# Patient Record
Sex: Female | Born: 1945 | Race: White | Hispanic: No | Smoking: Never smoker
Health system: Southern US, Community
[De-identification: ages and names within clinical notes are randomized; demographics above are authoritative.]

## PROBLEM LIST (undated history)

## (undated) DIAGNOSIS — E785 Hyperlipidemia, unspecified: Secondary | ICD-10-CM

## (undated) DIAGNOSIS — G8929 Other chronic pain: Secondary | ICD-10-CM

## (undated) DIAGNOSIS — L219 Seborrheic dermatitis, unspecified: Secondary | ICD-10-CM

## (undated) DIAGNOSIS — J181 Lobar pneumonia, unspecified organism: Secondary | ICD-10-CM

## (undated) DIAGNOSIS — Z8719 Personal history of other diseases of the digestive system: Secondary | ICD-10-CM

## (undated) DIAGNOSIS — T8859XA Other complications of anesthesia, initial encounter: Secondary | ICD-10-CM

## (undated) DIAGNOSIS — R7301 Impaired fasting glucose: Secondary | ICD-10-CM

## (undated) DIAGNOSIS — I10 Essential (primary) hypertension: Secondary | ICD-10-CM

## (undated) DIAGNOSIS — M1990 Unspecified osteoarthritis, unspecified site: Secondary | ICD-10-CM

## (undated) DIAGNOSIS — K449 Diaphragmatic hernia without obstruction or gangrene: Secondary | ICD-10-CM

## (undated) DIAGNOSIS — K222 Esophageal obstruction: Secondary | ICD-10-CM

## (undated) DIAGNOSIS — Z950 Presence of cardiac pacemaker: Secondary | ICD-10-CM

## (undated) DIAGNOSIS — I498 Other specified cardiac arrhythmias: Secondary | ICD-10-CM

## (undated) DIAGNOSIS — K219 Gastro-esophageal reflux disease without esophagitis: Secondary | ICD-10-CM

## (undated) DIAGNOSIS — I499 Cardiac arrhythmia, unspecified: Secondary | ICD-10-CM

## (undated) DIAGNOSIS — M545 Low back pain, unspecified: Secondary | ICD-10-CM

## (undated) DIAGNOSIS — N183 Chronic kidney disease, stage 3 (moderate): Secondary | ICD-10-CM

## (undated) DIAGNOSIS — T4145XA Adverse effect of unspecified anesthetic, initial encounter: Secondary | ICD-10-CM

## (undated) DIAGNOSIS — Z87442 Personal history of urinary calculi: Secondary | ICD-10-CM

## (undated) DIAGNOSIS — M5030 Other cervical disc degeneration, unspecified cervical region: Secondary | ICD-10-CM

## (undated) HISTORY — PX: CARPAL TUNNEL RELEASE: SHX101

## (undated) HISTORY — PX: GUM SURGERY: SHX658

## (undated) HISTORY — PX: ABDOMINAL HYSTERECTOMY: SHX81

## (undated) HISTORY — PX: TARSAL TUNNEL RELEASE: SUR1099

## (undated) HISTORY — PX: BACK SURGERY: SHX140

## (undated) HISTORY — PX: CYSTOSCOPY W/ STONE MANIPULATION: SHX1427

## (undated) HISTORY — PX: ESOPHAGOGASTRODUODENOSCOPY (EGD) WITH ESOPHAGEAL DILATION: SHX5812

## (undated) HISTORY — PX: LUMBAR DISC SURGERY: SHX700

## (undated) HISTORY — DX: Other specified cardiac arrhythmias: I49.8

## (undated) HISTORY — PX: TONSILLECTOMY: SUR1361

## (undated) HISTORY — PX: BLADDER SUSPENSION: SHX72

## (undated) HISTORY — PX: JOINT REPLACEMENT: SHX530

## (undated) HISTORY — PX: DILATION AND CURETTAGE OF UTERUS: SHX78

## (undated) HISTORY — PX: TRANSTHORACIC ECHOCARDIOGRAM: SHX275

---

## 2004-06-30 ENCOUNTER — Ambulatory Visit: Admission: RE | Admit: 2004-06-30 | Discharge: 2004-06-30 | Payer: Self-pay | Admitting: Specialist

## 2004-08-02 HISTORY — PX: TOTAL KNEE ARTHROPLASTY: SHX125

## 2004-08-26 ENCOUNTER — Inpatient Hospital Stay (HOSPITAL_COMMUNITY): Admission: RE | Admit: 2004-08-26 | Discharge: 2004-08-29 | Payer: Self-pay | Admitting: Specialist

## 2004-09-01 ENCOUNTER — Ambulatory Visit (HOSPITAL_COMMUNITY): Admission: RE | Admit: 2004-09-01 | Discharge: 2004-09-01 | Payer: Self-pay | Admitting: Specialist

## 2004-10-30 ENCOUNTER — Ambulatory Visit (HOSPITAL_COMMUNITY): Admission: RE | Admit: 2004-10-30 | Discharge: 2004-10-30 | Payer: Self-pay | Admitting: Specialist

## 2005-03-30 ENCOUNTER — Encounter: Admission: RE | Admit: 2005-03-30 | Discharge: 2005-03-30 | Payer: Self-pay | Admitting: Orthopedic Surgery

## 2005-08-02 HISTORY — PX: TOTAL KNEE REVISION: SHX996

## 2005-10-27 ENCOUNTER — Inpatient Hospital Stay (HOSPITAL_COMMUNITY): Admission: RE | Admit: 2005-10-27 | Discharge: 2005-10-30 | Payer: Self-pay | Admitting: Orthopedic Surgery

## 2005-12-14 ENCOUNTER — Ambulatory Visit (HOSPITAL_BASED_OUTPATIENT_CLINIC_OR_DEPARTMENT_OTHER): Admission: RE | Admit: 2005-12-14 | Discharge: 2005-12-14 | Payer: Self-pay | Admitting: Orthopedic Surgery

## 2005-12-16 ENCOUNTER — Ambulatory Visit: Payer: Self-pay | Admitting: Internal Medicine

## 2005-12-16 ENCOUNTER — Inpatient Hospital Stay (HOSPITAL_COMMUNITY): Admission: AD | Admit: 2005-12-16 | Discharge: 2005-12-22 | Payer: Self-pay | Admitting: Orthopedic Surgery

## 2006-01-06 ENCOUNTER — Ambulatory Visit: Payer: Self-pay | Admitting: Internal Medicine

## 2006-03-21 ENCOUNTER — Inpatient Hospital Stay (HOSPITAL_COMMUNITY): Admission: RE | Admit: 2006-03-21 | Discharge: 2006-03-25 | Payer: Self-pay | Admitting: Orthopedic Surgery

## 2006-03-22 ENCOUNTER — Ambulatory Visit: Payer: Self-pay | Admitting: Infectious Diseases

## 2006-07-18 ENCOUNTER — Inpatient Hospital Stay (HOSPITAL_COMMUNITY): Admission: RE | Admit: 2006-07-18 | Discharge: 2006-07-22 | Payer: Self-pay | Admitting: Orthopedic Surgery

## 2007-04-27 ENCOUNTER — Ambulatory Visit: Payer: Self-pay | Admitting: Family Medicine

## 2007-04-27 DIAGNOSIS — I1310 Hypertensive heart and chronic kidney disease without heart failure, with stage 1 through stage 4 chronic kidney disease, or unspecified chronic kidney disease: Secondary | ICD-10-CM | POA: Insufficient documentation

## 2007-04-27 DIAGNOSIS — I129 Hypertensive chronic kidney disease with stage 1 through stage 4 chronic kidney disease, or unspecified chronic kidney disease: Secondary | ICD-10-CM | POA: Insufficient documentation

## 2007-04-27 DIAGNOSIS — I10 Essential (primary) hypertension: Secondary | ICD-10-CM

## 2007-04-27 HISTORY — DX: Essential (primary) hypertension: I10

## 2007-04-28 ENCOUNTER — Encounter: Payer: Self-pay | Admitting: Family Medicine

## 2007-04-28 LAB — CONVERTED CEMR LAB
ALT: 16 units/L (ref 0–35)
AST: 14 units/L (ref 0–37)
Albumin: 4.5 g/dL (ref 3.5–5.2)
Alkaline Phosphatase: 32 units/L — ABNORMAL LOW (ref 39–117)
BUN: 22 mg/dL (ref 6–23)
CO2: 23 meq/L (ref 19–32)
Calcium: 10 mg/dL (ref 8.4–10.5)
Chloride: 102 meq/L (ref 96–112)
Cholesterol, target level: 200 mg/dL
Cholesterol: 220 mg/dL — ABNORMAL HIGH (ref 0–200)
Creatinine, Ser: 0.98 mg/dL (ref 0.40–1.20)
Glucose, Bld: 97 mg/dL (ref 70–99)
HDL goal, serum: 40 mg/dL
HDL: 49 mg/dL (ref >39)
LDL Cholesterol: 141 mg/dL — ABNORMAL HIGH (ref 0–99)
LDL Goal: 130 mg/dL
Potassium: 4.6 meq/L (ref 3.5–5.3)
Sodium: 138 meq/L (ref 135–145)
Total Bilirubin: 0.7 mg/dL (ref 0.3–1.2)
Total CHOL/HDL Ratio: 4.5
Total Protein: 7.3 g/dL (ref 6.0–8.3)
Triglycerides: 150 mg/dL — ABNORMAL HIGH (ref <150)
VLDL: 30 mg/dL (ref 0–40)

## 2007-08-09 ENCOUNTER — Encounter: Admission: RE | Admit: 2007-08-09 | Discharge: 2007-08-09 | Payer: Self-pay | Admitting: Orthopedic Surgery

## 2007-08-29 ENCOUNTER — Ambulatory Visit: Payer: Self-pay | Admitting: Family Medicine

## 2007-09-02 ENCOUNTER — Encounter: Admission: RE | Admit: 2007-09-02 | Discharge: 2007-09-02 | Payer: Self-pay | Admitting: Unknown Physician Specialty

## 2007-10-04 ENCOUNTER — Ambulatory Visit: Payer: Self-pay | Admitting: Family Medicine

## 2007-10-04 DIAGNOSIS — K449 Diaphragmatic hernia without obstruction or gangrene: Secondary | ICD-10-CM | POA: Insufficient documentation

## 2007-10-04 DIAGNOSIS — K219 Gastro-esophageal reflux disease without esophagitis: Secondary | ICD-10-CM

## 2007-10-04 HISTORY — DX: Gastro-esophageal reflux disease without esophagitis: K21.9

## 2007-10-04 HISTORY — DX: Diaphragmatic hernia without obstruction or gangrene: K44.9

## 2007-10-21 ENCOUNTER — Encounter: Admission: RE | Admit: 2007-10-21 | Discharge: 2007-10-21 | Payer: Self-pay | Admitting: Unknown Physician Specialty

## 2007-12-01 ENCOUNTER — Ambulatory Visit: Payer: Self-pay | Admitting: Family Medicine

## 2008-05-03 ENCOUNTER — Ambulatory Visit: Payer: Self-pay | Admitting: Family Medicine

## 2008-06-07 ENCOUNTER — Telehealth: Payer: Self-pay | Admitting: Family Medicine

## 2008-09-20 ENCOUNTER — Telehealth: Payer: Self-pay | Admitting: Family Medicine

## 2008-09-20 DIAGNOSIS — M25539 Pain in unspecified wrist: Secondary | ICD-10-CM

## 2008-09-24 ENCOUNTER — Encounter: Payer: Self-pay | Admitting: Family Medicine

## 2008-09-27 LAB — CONVERTED CEMR LAB: Zinc: 78 (ref 60–130)

## 2008-10-04 ENCOUNTER — Telehealth: Payer: Self-pay | Admitting: Family Medicine

## 2008-12-31 ENCOUNTER — Encounter: Admission: RE | Admit: 2008-12-31 | Discharge: 2008-12-31 | Payer: Self-pay | Admitting: Orthopedic Surgery

## 2009-01-07 ENCOUNTER — Telehealth: Payer: Self-pay | Admitting: Family Medicine

## 2010-07-03 ENCOUNTER — Ambulatory Visit: Payer: Self-pay | Admitting: Family Medicine

## 2010-07-06 ENCOUNTER — Telehealth (INDEPENDENT_AMBULATORY_CARE_PROVIDER_SITE_OTHER): Payer: Self-pay | Admitting: *Deleted

## 2010-07-06 LAB — CONVERTED CEMR LAB
ALT: 25 units/L (ref 0–35)
AST: 19 units/L (ref 0–37)
Albumin: 4.5 g/dL (ref 3.5–5.2)
Alkaline Phosphatase: 25 units/L — ABNORMAL LOW (ref 39–117)
BUN: 26 mg/dL — ABNORMAL HIGH (ref 6–23)
CO2: 24 meq/L (ref 19–32)
Calcium: 10 mg/dL (ref 8.4–10.5)
Chloride: 104 meq/L (ref 96–112)
Cholesterol: 227 mg/dL — ABNORMAL HIGH (ref 0–200)
Creatinine, Ser: 1.35 mg/dL — ABNORMAL HIGH (ref 0.40–1.20)
Glucose, Bld: 95 mg/dL (ref 70–99)
HDL: 45 mg/dL (ref >39)
LDL Cholesterol: 143 mg/dL — ABNORMAL HIGH (ref 0–99)
Potassium: 5.2 meq/L (ref 3.5–5.3)
Sodium: 140 meq/L (ref 135–145)
Total Bilirubin: 0.4 mg/dL (ref 0.3–1.2)
Total CHOL/HDL Ratio: 5
Total Protein: 7 g/dL (ref 6.0–8.3)
Triglycerides: 195 mg/dL — ABNORMAL HIGH (ref <150)
VLDL: 39 mg/dL (ref 0–40)

## 2010-08-10 ENCOUNTER — Ambulatory Visit
Admission: RE | Admit: 2010-08-10 | Discharge: 2010-08-10 | Payer: Self-pay | Source: Home / Self Care | Attending: Family Medicine | Admitting: Family Medicine

## 2010-08-10 DIAGNOSIS — N183 Chronic kidney disease, stage 3 unspecified: Secondary | ICD-10-CM | POA: Insufficient documentation

## 2010-08-10 DIAGNOSIS — R7301 Impaired fasting glucose: Secondary | ICD-10-CM

## 2010-08-10 DIAGNOSIS — E118 Type 2 diabetes mellitus with unspecified complications: Secondary | ICD-10-CM | POA: Insufficient documentation

## 2010-08-10 DIAGNOSIS — E785 Hyperlipidemia, unspecified: Secondary | ICD-10-CM | POA: Insufficient documentation

## 2010-08-10 HISTORY — DX: Impaired fasting glucose: R73.01

## 2010-08-10 HISTORY — DX: Hyperlipidemia, unspecified: E78.5

## 2010-08-20 ENCOUNTER — Encounter: Payer: Self-pay | Admitting: Family Medicine

## 2010-08-20 LAB — CONVERTED CEMR LAB
BUN: 37 mg/dL — ABNORMAL HIGH (ref 6–23)
CO2: 24 meq/L (ref 19–32)
Creatinine, Ser: 1.6 mg/dL — ABNORMAL HIGH (ref 0.40–1.20)
Glucose, Bld: 134 mg/dL — ABNORMAL HIGH (ref 70–99)
Potassium: 4.7 meq/L (ref 3.5–5.3)
Sodium: 138 meq/L (ref 135–145)

## 2010-08-21 ENCOUNTER — Ambulatory Visit: Admit: 2010-08-21 | Payer: Self-pay | Admitting: Family Medicine

## 2010-08-23 ENCOUNTER — Encounter: Payer: Self-pay | Admitting: Orthopedic Surgery

## 2010-08-24 ENCOUNTER — Ambulatory Visit
Admission: RE | Admit: 2010-08-24 | Discharge: 2010-08-24 | Payer: Self-pay | Source: Home / Self Care | Attending: Family Medicine | Admitting: Family Medicine

## 2010-09-01 NOTE — Assessment & Plan Note (Signed)
Summary: hypertension follow-up.  Patient has not been seen since 2009   Vital Signs:  Patient profile:   65 year old female Height:      62 inches Weight:      192 pounds BMI:     35.24 Pulse rate:   102 / minute BP sitting:   162 / 88  (right arm) Cuff size:   large  Vitals Entered By: Avon Gully CMA, Duncan Dull) (July 03, 2010 9:58 AM)  Serial Vital Signs/Assessments:  Time      Position  BP       Pulse  Resp  Temp     By 10:26 AM            152/86                         Avon Gully CMA, (AAMA)  CC: BLd work, f/u BP, Hypertension Management   Primary Care Porsche Noguchi:  Nani Gasser MD  CC:  BLd work, f/u BP, and Hypertension Management.  History of Present Illness: [name] has not been here since 2009.  She came in today because we had not refilled her blood pressure medications.  She denies any chest pain shortness of breath or symptoms.  She says she has tolerated her blood pressure medications well.  She still continues to have knee problems and follows with a repeat orthopedist regularly. She does not check her blood pressure she denies any headaches or feeling poorly.  Hypertension History:      She denies headache, chest pain, palpitations, dyspnea with exertion, orthopnea, PND, peripheral edema, visual symptoms, neurologic problems, syncope, and side effects from treatment.  She notes no problems with any antihypertensive medication side effects.  Has noticed shesweats alot. Marland Kitchen        Positive major cardiovascular risk factors include female age 56 years old or older and hypertension.  Negative major cardiovascular risk factors include no history of diabetes or hyperlipidemia, negative family history for ischemic heart disease, and non-tobacco-user status.        Further assessment for target organ damage reveals no history of ASHD, cardiac end-organ damage (CHF/LVH), stroke/TIA, peripheral vascular disease, renal insufficiency, or hypertensive retinopathy.      Current Medications (verified): 1)  Lisinopril-Hydrochlorothiazide 20-25 Mg  Tabs (Lisinopril-Hydrochlorothiazide) .... Take 1 Tablet By Mouth Once A Day 2)  Hydrocodone-Acetaminophen 10-750 Mg  Tabs (Hydrocodone-Acetaminophen) .... Take One Tablet By Mouth Twice A Day As Needed 3)  Methocarbamol 750 Mg  Tabs (Methocarbamol) .... Take One Tablet By Mouth Three Times A Day 4)  Arthrotec 75 75-200 Mg-Mcg  Tabs (Diclofenac-Misoprostol) .... Take 1 Tablet By Mouth Two Times A Day  Allergies (verified): No Known Drug Allergies  Comments:  Nurse/Medical Assistant: The patient's medications and allergies were reviewed with the patient and were updated in the Medication and Allergy Lists. Avon Gully CMA, Duncan Dull) (July 03, 2010 9:58 AM)  Past History:  Past Surgical History: Last updated: 12/01/2007 Complete Left Knee replacement in 2006.  Got a staph infection and had to have IV ABX and spacer place. Then  had second replacement with new hardware in 2007- On chronic Keflex. Hysterectomy 2 Back Surgeries  Social History: Last updated: 07/03/2010 Retired.  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  Never Smoked Alcohol use-no Drug use-no No regular exercise.   Past Medical History: Ortho- Dr [name] prescribes her chronic pain meds.  Dr. Chriss Czar in [facility] - Neurologist.  Family History: Reviewed history from 04/27/2007 and no changes required. Mother with MI, DM, HTN Father with HTN  Social History: Retired.  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  Never Smoked Alcohol use-no Drug use-no No regular exercise.   Physical Exam  General:  Well-developed,well-nourished,in no acute distress; alert,appropriate and cooperative throughout examination. she is obese. Head:  Normocephalic and atraumatic without obvious abnormalities. No apparent alopecia or balding. Lungs:  Normal respiratory effort, chest expands symmetrically. Lungs are clear to  auscultation, no crackles or wheezes. Heart:  Normal rate and regular rhythm. S1 and S2 normal without gallop, murmur, click, rub or other extra sounds. no carotid or abdominal bruits Pulses:  radial pulses 2+ bilaterally Extremities:  no lower extremity edema Skin:  no rashes.   Cervical Nodes:  No lymphadenopathy noted Psych:  Cognition and judgment appear intact. Alert and cooperative with normal attention span and concentration. No apparent delusions, illusions, hallucinations   Impression & Recommendations:  Problem # 1:  HYPERTENSION, BENIGN ESSENTIAL (ICD-401.1) Assessment Deteriorated Has gianed 10 lbs. this is clearly contribute to her poor blood pressure control.replete blood pressure was still high as well.  All beta-blocker to her current regimen and have her follow-up in one month.  That will be honest on the sure she will follow-up.  She is not really believe and preventative care.  She is also due for lab work as it has been is two years.  I gave her the lab slip today. Her updated medication list for this problem includes:    Lisinopril-hydrochlorothiazide 20-25 Mg Tabs (Lisinopril-hydrochlorothiazide) .Marland Kitchen... Take 1 tablet by mouth once a day    Metoprolol Succinate 50 Mg Xr24h-tab (Metoprolol succinate) .Marland Kitchen... Take 1 tablet by mouth once a day  Orders: T-Comprehensive Metabolic Panel [id number]) T-Lipid Profile ([id number])  Complete Medication List: 1)  Lisinopril-hydrochlorothiazide 20-25 Mg Tabs (Lisinopril-hydrochlorothiazide) .... Take 1 tablet by mouth once a day 2)  Hydrocodone-acetaminophen 10-750 Mg Tabs (Hydrocodone-acetaminophen) .... Take one tablet by mouth twice a day as needed 3)  Methocarbamol 750 Mg Tabs (Methocarbamol) .... Take one tablet by mouth three times a day 4)  Arthrotec 75 75-200 Mg-mcg Tabs (Diclofenac-misoprostol) .... Take 1 tablet by mouth two times a day 5)  Metoprolol Succinate 50 Mg Xr24h-tab (Metoprolol succinate) .... Take 1 tablet by  mouth once a day  Hypertension Assessment/Plan:      The patient's hypertensive risk group is category B: At least one risk factor (excluding diabetes) with no target organ damage.  Her calculated 10 year risk of coronary heart disease is 17 %.  Today's blood pressure is 162/88.    Contraindications/Deferment of Procedures/Staging:    Test/Procedure: Colonoscopy    Reason for deferment: patient declined     Test/Procedure: PAP Smear    Reason for deferment: patient declined     Test/Procedure: Mammogram    Reason for deferment: patient declined   Patient Instructions: 1)  Follow up in one month to recheck your blood pressure.  Prescriptions: LISINOPRIL-HYDROCHLOROTHIAZIDE 20-25 MG  TABS (LISINOPRIL-HYDROCHLOROTHIAZIDE) Take 1 tablet by mouth once a day  #90 Tablet x 1   Entered and Authorized by:   Nani Gasser MD   Signed by:   Nani Gasser MD on 07/03/2010   Method used:   Electronically to        [facility] # [phone]* (retail)       [address]       [facility],  [place]  [address]       Ph: [phone]       Fax: [phone]   RxID:   [id number] METOPROLOL SUCCINATE 50 MG XR24H-TAB (METOPROLOL SUCCINATE) Take 1 tablet by mouth once a day  #30 x 1   Entered and Authorized by:   Nani Gasser MD   Signed by:   Nani Gasser MD on 07/03/2010   Method used:   Electronically to        [facility] # [phone]* (retail)       [address] [place], [place]  [address]       Ph: [phone]       Fax: [phone]   RxID:   [id number]    Orders Added: 1)  T-Comprehensive Metabolic Panel [[id number]] 2)  T-Lipid Profile [[id number]] 3)  Est. Patient Level IV [60109]   Immunization History:  Influenza Immunization History:    Influenza:  historical (05/04/2010)  Tetanus/Td Immunization History:    Tetanus/Td:  historical (08/03/2003)  Pneumovax Immunization History:     Pneumovax:  historical (08/02/2006)   Immunization History:  Influenza Immunization History:    Influenza:  Historical (05/04/2010)  Tetanus/Td Immunization History:    Tetanus/Td:  Historical (08/03/2003)  Pneumovax Immunization History:    Pneumovax:  Historical (08/02/2006)   Immunization History:  Influenza Immunization History:    Influenza:  historical (05/04/2010)  Tetanus/Td Immunization History:    Tetanus/Td:  historical (08/03/2003)  Pneumovax Immunization History:    Pneumovax:  historical (08/02/2006)   TD Next Due:  10 yr

## 2010-09-01 NOTE — Progress Notes (Signed)
----   Converted from flag ---- ---- 07/03/2010 12:45 PM, Nani Gasser MD wrote: we let patient know that I sent her for a second blood pressure medication.  Thus she will be taking two medications.  She can take both at the same time one time a day.  Follow-up in one month ------------------------------ 07/06/10 8:49 acm  called and notified pt of above

## 2010-09-03 NOTE — Assessment & Plan Note (Signed)
Summary: F/UPP HTN   Vital Signs:  Patient profile:   65 year old female Height:      62 inches Weight:      193 pounds Pulse rate:   97 / minute BP sitting:   128 / 78  (right arm) Cuff size:   regular  Vitals Entered By: Avon Gully CMA, Duncan Dull) (August 10, 2010 12:59 PM) CC: f/u BP   CC:  f/u BP.  Current Medications (verified): 1)  Lisinopril-Hydrochlorothiazide 20-12.5 Mg Tabs (Lisinopril-Hydrochlorothiazide) .... Take 1 Tablet By Mouth Once A Day 2)  Hydrocodone-Acetaminophen 10-750 Mg  Tabs (Hydrocodone-Acetaminophen) .... Take One Tablet By Mouth Twice A Day As Needed 3)  Methocarbamol 750 Mg  Tabs (Methocarbamol) .... Take One Tablet By Mouth Three Times A Day 4)  Arthrotec 75 75-200 Mg-Mcg  Tabs (Diclofenac-Misoprostol) .... Take 1 Tablet By Mouth Two Times A Day 5)  Metoprolol Succinate 50 Mg Xr24h-Tab (Metoprolol Succinate) .... Take 1 Tablet By Mouth Once A Day  Allergies (verified): No Known Drug Allergies  Comments:  Nurse/Medical Assistant: The patient's medications and allergies were reviewed with the patient and were updated in the Medication and Allergy Lists. Avon Gully CMA, Duncan Dull) (August 10, 2010 1:00 PM)   Impression & Recommendations:  Problem # 1:  HYPERTENSION, BENIGN ESSENTIAL (ICD-401.1)  Dong well on the  medication. BP looks fantastic. She still needs to f/u on her kidney function as she may have some chronic renals insufficiency or she may just have  been a little dedhydrated. If cr/bun still high will need to get rid of the HCTZ completely.  F/U in 6 mo.  Her updated medication list for this problem includes:    Lisinopril-hydrochlorothiazide 20-12.5 Mg Tabs (Lisinopril-hydrochlorothiazide) .Marland Kitchen... Take 1 tablet by mouth once a day    Metoprolol Succinate 50 Mg Xr24h-tab (Metoprolol succinate) .Marland Kitchen... Take 1 tablet by mouth once a day  Orders: T-Basic Metabolic Panel [id number])  Problem # 2:  BLOOD CHEMISTRY, ABNORMAL  (ICD-790.99) She still needs to f/u on her kidney function as she may have some chronic renals insufficiency or she may just have  been a little dedhydrated. If cr/bun still high will need to get rid of the HCTZ completely.  Orders: T-Basic Metabolic Panel [phone])  Problem # 3:  HYPERLIPIDEMIA (ICD-272.4) She adamantly doesn't want to take a statin.   Complete Medication List: 1)  Lisinopril-hydrochlorothiazide 20-12.5 Mg Tabs (Lisinopril-hydrochlorothiazide) .... Take 1 tablet by mouth once a day 2)  Hydrocodone-acetaminophen 10-750 Mg Tabs (Hydrocodone-acetaminophen) .... Take one tablet by mouth twice a day as needed 3)  Methocarbamol 750 Mg Tabs (Methocarbamol) .... Take one tablet by mouth three times a day 4)  Arthrotec 75 75-200 Mg-mcg Tabs (Diclofenac-misoprostol) .... Take 1 tablet by mouth two times a day 5)  Metoprolol Succinate 50 Mg Xr24h-tab (Metoprolol succinate) .... Take 1 tablet by mouth once a day  Contraindications/Deferment of Procedures/Staging:    Test/Procedure: Statin Declined    Reason for deferment: patient declined   Patient Instructions: 1)  Please schedule a follow-up appointment in 6 months for blood pressure.    Orders Added: 1)  Est. Patient Level III [69629] 2)  T-Basic Metabolic Panel [[id number]] 3)  Est. Patient Level III [52841]

## 2010-09-03 NOTE — Assessment & Plan Note (Signed)
Summary: INsulin resistance   Vital Signs:  Patient profile:   65 year old female Height:      62 inches Weight:      192 pounds Pulse rate:   95 / minute BP sitting:   138 / 79  (right arm) Cuff size:   regular  Vitals Entered By: Avon Gully CMA, Duncan Dull) (August 24, 2010 10:50 AM) CC: f/u Blood sugars   Primary Care Provider:  Nani Gasser MD  CC:  f/u Blood sugars.  History of Present Illness: F/U abnormal blood glucose. Sugar was 130s.  Denies any polyuria, polydipsia or vision changes.  Admits she eats out of bordome.   Current Medications (verified): 1)  Lisinopril-Hydrochlorothiazide 20-12.5 Mg Tabs (Lisinopril-Hydrochlorothiazide) .... Take 1 Tablet By Mouth Once A Day 2)  Hydrocodone-Acetaminophen 10-750 Mg  Tabs (Hydrocodone-Acetaminophen) .... Take One Tablet By Mouth Twice A Day As Needed 3)  Methocarbamol 750 Mg  Tabs (Methocarbamol) .... Take One Tablet By Mouth Three Times A Day 4)  Arthrotec 75 75-200 Mg-Mcg  Tabs (Diclofenac-Misoprostol) .... Take 1 Tablet By Mouth Two Times A Day 5)  Metoprolol Succinate 50 Mg Xr24h-Tab (Metoprolol Succinate) .... Take 1 Tablet By Mouth Once A Day  Allergies (verified): No Known Drug Allergies  Comments:  Nurse/Medical Assistant: The patient's medications and allergies were reviewed with the patient and were updated in the Medication and Allergy Lists. Avon Gully CMA, Duncan Dull) (August 24, 2010 10:50 AM)  Family History: Reviewed history from 04/27/2007 and no changes required. 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  F/U in 6 months. Call if any concerns Given H.O on carb counting and appropriate portions, etc.  Encouragedregular exercise.    Orders: Fingerstick (36416) Hgb A1C (16109UE)  Complete Medication List: 1)  Lisinopril-hydrochlorothiazide 20-12.5 Mg Tabs (Lisinopril-hydrochlorothiazide) .... Take 1 tablet by mouth once a day 2)  Hydrocodone-acetaminophen 10-750 Mg Tabs (Hydrocodone-acetaminophen) .... Take one tablet by mouth twice a day as needed 3)  Methocarbamol 750 Mg Tabs (Methocarbamol) .... Take one tablet by mouth three times a day 4)  Arthrotec 75 75-200 Mg-mcg Tabs (Diclofenac-misoprostol) .... Take 1 tablet by mouth two times a day 5)  Metoprolol Succinate 50 Mg Xr24h-tab (Metoprolol succinate) .... Take 1 tablet by mouth once a day  Patient Instructions: 1)  Recheck sugar in 6 months  2)  Really watch your carbs and sugars.    Orders Added: 1)  Fingerstick [36416] 2)  Hgb A1C [83036QW] 3)  Est. Patient Level III [45409]    Laboratory Results   Blood Tests   Date/Time Received: 08/24/10 Date/Time Reported: 08/24/10       Appended Document: INsulin resistance    Clinical Lists Changes  Observations: Added new observation of HGBA1C: 6.0 % (08/24/2010 11:11)      Laboratory Results   Blood Tests     HGBA1C: 6.0%   (Normal Range: Non-Diabetic - 3-6%   Control Diabetic - 6-8%)

## 2010-11-19 ENCOUNTER — Other Ambulatory Visit: Payer: Self-pay | Admitting: Family Medicine

## 2010-12-18 NOTE — Discharge Summary (Signed)
NAMEBILLIEJEAN, SCHIMEK               ACCOUNT NO.:  [id number]   MEDICAL RECORD NO.:  [id number]          PATIENT TYPE:  INP   LOCATION:  5033                         FACILITY:  [facility]   PHYSICIAN:  Feliberto Gottron. [name], M.D.   DATE OF BIRTH:  1945/12/31   DATE OF ADMISSION:  03/21/2006  DATE OF DISCHARGE:  03/25/2006                                 DISCHARGE SUMMARY   PRIMARY DIAGNOSIS FOR THIS ADMISSION:  Infected right total knee  arthroplasty.   DISCHARGE SUMMARY:  Patient is a 65 year old woman who underwent revision of  her slightly oversized femoral component on January of 2007.  She went on to  develop postoperative infection and was treated with radical irrigation and  debridement, retention apart and unfortunately went on to continue to be  infected with swelling and pain despite the radical irrigation and  debridement and 6 weeks of p.o. antibiotics.  She came off her rifampin a  few weeks ago, had increased swelling, pain and fever.  Her knee was  aspirated and only a few drops of blood would come out, which grew out  methicillin sensitive staph aureus, which was the infecting organ after  provision.  Because of this, we have recommended removal of the components,  radical irrigation and debridement and placement of ___________ spacer.  This was discussed at length with the patient.  She is now ready for this  surgical intervention.   ALLERGIES:  SHE HAS NO KNOWN DRUG ALLERGIES.   MEDICATIONS AT TIME OF ADMISSION:  Include oxycodone, rifampin, lisinopril  and Keflex.   PAST MEDICAL HISTORY:  Previous surgical history of a bladder tuck  approximately 20 years ago, tonsillectomy and carpal tunnel release.  No  significant medical history.   FAMILY HISTORY:  Unremarkable.   SOCIAL HISTORY:  Patient lives by herself in [place].   REVIEW OF SYSTEMS:  Negative for any other illness, chest pain or shortness  of breath.   PHYSICAL EXAMINATION:  VITAL SIGNS:  Temperature  99.5.  Pulse of 70.  Respirations 14.  GENERAL:  This is a well-developed, well-nourished 65 year old.  HEENT:  Head is normocephalic, atraumatic.  Ears:  TMs are clear.  Eyes:  Pupils equal, round and reactive to light and accommodation.  Nares:  Patent.  Throat:  Benign.  NECK:  Supple with full range of motion.  CHEST:  Clear to auscultation and percussion.  HEART:  Regular rate and rhythm.  ABDOMEN:  Soft, nontender.  EXTREMITY:  Left knee has significant swelling without obvious erythema.  It  is tender to touch.  She is, otherwise, neurovascularly intact.   X-rays show stable total knee arthroplasty.   PREOPERATIVE LABS:  Include an EKG, PT, PTT, CBC, CMET, chest x-ray were all  within normal limits.   HOSPITAL COURSE:  On the day of admission, the patient was taken to the  operating room at [facility], where she underwent a left total knee  radical revision, debridement of removal of all total knee parts and  retained cement with antibiotic, blade and spacer placed between the femur  and the tibia.  Patient was  placed on perioperative antibiotics.  She was  placed on postoperative Coumadin prophylaxis with Lovenox to cover until she  became therapeutic.  She was placed on PCA for pain control and a Foley was  placed perioperatively.   First postoperative day, the patient was awake and alert in moderate pain,  controlled well with PCA.  Urine output 100 cc over night despite a bolus.  Hemoglobin 9.7, PT 14.  Vital signs were stable.  Dressing was clean and  dry.  She was neurovascularly intact.  Drain output 350 cc over night.  It  was felt she had decreased urine output secondary to fluid shift after total  knee arthroplasty removal and placed on antibiotic blade and spacer.  She  was given another bolus and her BMET was checked.  Infectious disease was  also consulted for their assistance regarding the patient's medical  coverage.  A PICC line was placed for easier IV  access.  Infectious disease  recommended continuing IV Ancef.   Postoperative day 2, the patient was complaining of moderate pain, nausea  and vomiting.  She was afebrile.  Hemoglobin 8.5.  INR 1.4.  Dressing was  dry.  Drains were in place.  She was, otherwise, neurovascularly intact.  She continued with her IV antibiotics and PCA was discontinued.   Postoperative day 3, the patient was reporting decreased pain.  She was  eating an increasing amount of her food.  No nausea or vomiting.  PICC line  was running well.  She was afebrile.  Drain output 100 cc per shift.  Wound  was clean and dry.  Drains were left in place.  They had not yet become  quiescent.  Postoperative day 3, patient was complaining of moderate pain,  wounds benign.  Drain output 120 cc.  Continue with drains as per Dr.  Wadie Lessen instructions.  Hemoglobin 9.4, WBC 9.4, INR of 2.6.   Postoperative day 4, the patient was without complaints.  She was afebrile.  Vital signs were stable.  INR 1.8.  Dressing was dry and was benign.  Hemovac output less than 40 cc per day with only 25 ml yesterday.  __________ was discontinued without difficulty.  She was, otherwise, stable  both orthopedically and medically and was discharged home in the care of her  family.   MEDICATIONS:  Percocet, Coumadin per pharmacy protocol, Ancef 2 grams IV q.8  hours during the next 6 weeks.  This will be followed up by her home health  agency, which in her case was [place].   She will need home health PT, home health RN, weekly CBC with diff and  CMETs.  Follow up with Dr. Roxan Hockey in 5 to 7 days from infectious disease.  Follow up with Dr. [name] in approximately one week for followup check.   DIET:  Regular.   ACTIVITY:  Partial weight bearing with walker and knee immobilizer.      Laural Benes. Jannet Mantis.      Feliberto Gottron. [name], M.D.  Electronically Signed  JBR/MEDQ  D:  05/02/2006  T:  05/02/2006  Job:  [id number]

## 2010-12-18 NOTE — Op Note (Signed)
[name], [name]               ACCOUNT NO.:  [id number]   MEDICAL RECORD NO.:  [id number]          PATIENT TYPE:  AMB   LOCATION:  DSC                          FACILITY:  [facility]   PHYSICIAN:  Feliberto Gottron. [name], M.D.   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   PROCEDURE:  Aspiration of left total knee under general anesthesia with  closed manipulation.   SURGEON:  Feliberto Gottron. [name], MD.   FIRST ASSISTANT:  None.   ANESTHETIC:  General mask.   ESTIMATED BLOOD LOSS:  None.   FLUID REPLACEMENT:  300 ml of crystalloid.   DRAINS PLACED:  None.   TOURNIQUET TIME:  None.   SPECIMENS:  1.5 ml of joint fluid sent to the lab for gram stain and  culture.   INDICATIONS FOR PROCEDURE:  The patient is a 65 year old woman, who  underwent a downsizing of her left distal femoral prosthesis that had been  first placed about a year to a year-and-a-half ago by Dr. Shelle Iron.  It was  felt to be slightly large on the x-ray and we downsized her from a 7 to a 5  Osteonic Scorpio femoral component about six weeks ago.  Initially she had  great motion, and we set her up somewhat loose at the time of surgery.  She  has gone on now to develop a flexion contracture of almost 35 degrees  despite physical therapy in [place], although she can bend to about 90.  She is taken for a closed manipulation under a general anesthesia, and while  she is asleep I will go ahead and aspirate her knee to make sure that there  is no evidence of an infection, although she has not had any fevers or  drainage.  The risks and benefits of surgery discussed with the patient.   DESCRIPTION OF PROCEDURE:  The patient identified by arm band and taken to  the operating room at [facility] where the appropriate  anesthetic monitors were attached, and general mask anesthesia was induced.  Her left knee with her supine was up on a pillow flexed to about 40 degrees.  When I went to lift her foot up to get the pillow out, there was one soft  small pop and her knee came to full extension.  She was ligamentously  stable.  She had no effusion in her knee.  I went ahead and then aspirated  her knee using a medial parapatellar approach getting out 1 ml of straw-  colored fluid with a slight brownish tinge to it and I then injected her  with 10 ml of Marcaine, and she was still asleep at the time.  The fluid was  sent off for gram stain and culture.  I then went ahead and made sure that  her knee came out to full extension, which it did.  She easily flexed to 90  degrees and I closed a gentle pressure, I got her up to about 115 degrees of  flexion, and photographic documentation was made of  the total knee both in full extension and flexion.  At this point, we placed  her in a knee immobilizer.  She was awakened and taken to the recovery room  without difficulty, and the plans are to start her on physical therapy first  thing tomorrow.      Feliberto Gottron. [name], M.D.  Electronically Signed     FJR/MEDQ  D:  12/14/2005  T:  12/14/2005  Job:  [id number]

## 2010-12-18 NOTE — Op Note (Signed)
[name], [name]               ACCOUNT NO.:  [id number]   MEDICAL RECORD NO.:  [id number]          PATIENT TYPE:  INP   LOCATION:  5003                         FACILITY:  [facility]   PHYSICIAN:  Feliberto Gottron. [name], M.D.   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   PROCEDURE:  Left knee removal of PMMA minimum spacer and revision to  left total knee arthroplasty using S-ROM posterior stabilized rotating  bearing total knee arthroplasty.  On the femoral side we used a small  left femur, a 31 mm cone and an 11 x 100 stem.  On the tibial side a  small tibia, 37 mm cone, 9 x 100 mm stem and a 10 mm rotating platform,  posterior stabilized bearing.  All components cemented with a double  batch of DePuy cement with 1500 mg of Zinacef.   ESTIMATED BLOOD LOSS:  300 mL.   FLUID REPLACEMENT:  1500 mL.   URINE OUTPUT:  500 mL.   DRAINS:  Two medium Hemovacs.   TOURNIQUET TIME:  45 minutes.   Intraoperatively we did send off a Gram stain that came back negative,  cultures pending.   INDICATIONS FOR PROCEDURE:  A 65 year old woman who had downsizing of a  femoral component that was oversized by me last year.  Postoperatively  she developed a staph infection, failed to respond to a synovectomy and  washout, had removal of components, placement of a PMMA spacer 4 months  ago.  Since then her sedimentation rate  and CRP have dropped.  She got 6  weeks of IV antibiotics, 6 weeks of p.o. antibiotics.  The wound  remained benign.  It healed.  I attempted aspiration last week, came  back negative.  There was no fluid to be sent off for culture, and she  is remained afebrile.  She desires revision removal of the PMMA spacer  and revision to another total knee.  Risks and benefits of surgery are  well-known and she is prepared for surgical intervention.   DESCRIPTION OF PROCEDURE:  The patient was identified by armband, taken  the operating room at [facility].  Appropriate anesthetic  monitors were attached and general endotracheal anesthesia was induced.  She received a gram of Ancef preoperatively.  A Foley catheter was  placed, lateral post and foot positioner applied to the table, and the  tourniquet applied high to the left thigh.  The left lower extremity was  then prepped and draped in the usual sterile fashion from the ankle to  the tourniquet and  utilizing the old total knee midline incision, we cut  through the skin and subcutaneous tissue down to the transverse  retinaculum of the patella, which was reflected medially allowing a  medial parapatellar arthrotomy.  There was no significant joint fluid  encountered.  The cement button used for the patella was removed without  difficulty.  Large amounts of scar tissue were excised, allowing [place] to  sublux the patella laterally exposing the PMMA spacer that was removed  piecemeal with a 1/2 inch wide chisel.  Once this was accomplished,  posterior scar tissue was removed and it was quite extensive and  required quite a bit of effort to get all the scar tissue out,  especially around the posterior aspect of the femoral condyles.  Once  this been accomplished, we were able to hyperflex the knee.  The  superficial medial collateral ligament was elevated off of the proximal  tibia from anterior to posterior leaving it intact distally, allowing  [place]  to rotate the tibia out from underneath the femur, at which point we  entered the tibia with the DePuy step drill tunnel and found one more  piece of cement far down the shaft that was extracted without  difficulty.  We then reamed up to a 9 mm reamer for the 100 mm stem and  with this on the broach, broached up to a 37 broach.  This allowed [place] to  remove about 1 mm of bone laterally and little, if any, bone medially as  we cleaned up the proximal cut on the tibia.  The broach was then  removed.  We entered the distal femur with the DePuy step drill,  followed by axial reaming up to an 11 reamer, followed by the conical  reamer for the femur to the depth of one of the smaller broaches.  This  also required some manual removal of bone medially and laterally so that  the 31 broach could be inserted allowing once again a small brush cut  distally on the lateral side of the femur, and the medial side had about  a 2 mm defect.  The cutting block for the small femur was then brought  up and placed on the screw-in attachment on the femoral broach, pinned  along the epicondylar axis, and we drilled for the lugs on the femoral  component.  At this point the cutting guide for the small femur was  brought up and the anterior-posterior and chamfer cuts were  accomplished.  Very little, if any, bone was removed with most of these  cuts.  The broach was then extracted.  We assembled a trial with an 11  stem, a 31 mm femoral cone module, and a small left femoral component.  This was hammered into place and noted to have good fit laterally and  medially.  Again there was some deficiency to the bone.  This was about  1 or 2 mm.  The patella was then everted with towel clips and were  removed a thin wafer of bone posteriorly, sized for a 38 mm patellar  button, and drilled.  Throughout the procedure bits of scar tissue were removed as needed to mobilize the knee and allow placement of the  components.   The tibial trial was then inserted with a 9 mm 100 trial  stem, 37 mm tibial cone, and a small tibial baseplate, and then we  placed a 10 rotating platform bearing and reduced the knee.  It came to  full  extension, flexed to 130 degrees, and the patella tracked well.  At  this point the trial components were removed and all bony surfaces were  cleaned with a Water-Pic and dried with suction and sponges, and small  curettes were again used to remove any remaining scar tissue on the ends  of the bone.  Meanwhile, at the back table I assembled the tibial  components, which consisted of the small tibial baseplate, the 37 cone  in the correct rotation to the baseplate, and a nine x 100 stem.  On the  femoral side a small left femur, 31 femoral cone, and an 11 x 100 stem.  A double batch of DePuy standard setting cement was mixed with 1500 mg  of Zinacef and applied to all bony and metallic mating surfaces except  for the posterior condyles of the femur itself, and at this point we  hammered into place the tibial component and removed the excess cement,  followed by the femoral component and removed excess cement.  The 10 mm  rotating platform bearing was then snapped into place and the 38 mm  patellar button was squeezed onto the patella and held there with a  clamp and excess cement removed.  Once the cement had cured, the knee  was once again taken through a range of motion and noted to have good  tracking of the patella.  Small Hemovac drains were then placed deep in  the wound using a lateral insertion point on the femur.  The  parapatellar arthrotomy was closed with running #1 Vicryl suture, the  subcutaneous tissue  and 2-0 undyed Vicryl suture, and the skin with  skin staples.  Please note that prior to removing the trial components  the limb was wrapped with an Esmarch bandage and the tourniquet was  inflated and was left inflated until after the dressing had been  applied.  At this  point the patient was awakened and taken to the  recovery room without difficulty.      Feliberto Gottron. [name], M.D.  Electronically Signed    FJR/MEDQ  D:  07/18/2006  T:  07/19/2006  Job:  [id number]

## 2010-12-18 NOTE — Op Note (Signed)
[name], [name]               ACCOUNT NO.:  [id number]   MEDICAL RECORD NO.:  [id number]          PATIENT TYPE:  INP   LOCATION:  0012                         FACILITY:  [facility]   PHYSICIAN:  Jene Every, M.D.    DATE OF BIRTH:  [birth year]   DATE OF PROCEDURE:  08/26/2004  DATE OF DISCHARGE:                                 OPERATIVE REPORT   PREOPERATIVE DIAGNOSIS:  Degenerative joint disease, left knee.   POSTOPERATIVE DIAGNOSIS:  Degenerative joint disease, left knee.   PROCEDURE PERFORMED:  Total knee arthroplasty.   COMPONENTS UTILIZED:  Osteonics Scorpio cruciate sacrificing 7 femur, 5  tibia, 12-mm insert, 26 patella.   HISTORY/INDICATION:  A 65 year old with end-stage osteoporosis of the knee.  Operative intervention is indicated for placement of degenerated joint.  Risks and benefits have been discussed including bleeding, infection, damage  to vascular, suboptimal range of motion, arthrofibrosis, need for revision,  component loosening, etc.   DESCRIPTION OF PROCEDURE:  The patient was placed in the supine position  after induction of adequate general anesthesia, 1 g of Kefzol, left lower  extremity was prepped and draped and exsanguinated in the usual sterile  fashion.  A thigh tourniquet was inflated to 350 mmHg.  A midline incision  was made over the anterior aspect of the skin.  Subcutaneous tissue was  dissected with electrocautery used to achieve hemostasis.  A median  parapatellar arthrotomy was performed.  Patella was everted.  Released the  superficial and medial collateral ligament medially to the posterior aspect  of the medial femoral condyle.  Tricompartmental osteoarthrosis was noted.  Flexed the knee, removed osteophytes with the rongeur from the patella,  femur, and the tibia.  Remnants in the ACL, medial and lateral menisci were  then removed.  Step drill was utilized and entered the femoral canal and it  was irrigated.  Intermedullary guide 5  degree left was placed.  Ten  millimeters off the distal femur was selected as the distal femoral cut.  She had no contracture preoperatively.  This was pinned.  Ten millimeters  off the distal femur was then cut.   Following this, attention was turned over towards the tibia to recess the  PCL, remove the tibial spine with an oscillating saw.  Measured the tibia;  it is more of a 5.  This was applied.  Bushing utilized.  Step drill entered  the canal, irrigated.  Intermedullary guide placed.  First there was a  minimal defect off the medial tibial plateau but it was eburnated bone.  We  utilized first 6 off the proximal tibia.  First we sized the distal femur,  bisecting the condyles, measured it to a 7.  Appropriate 3 degrees of  external rotation.  This seemed adequate.  This was transfixed with a pin.  The pin was removed.  Distal femoral cutting jig applied.  Anterior,  posterior, and chamfer cuts were performed with a ___saw______ protecting  the soft tissues at all times.  Next, the distal femoral jig was applied and  the patellofemoral groove was chiseled.  The box cut was then  performed with  an oscillating saw for guiding and then a gouge and then an impactor without  difficulty.  No fracture of the femur was appreciated.  After full removal  of the PCL, we placed a trial femur.  The trial tibia seemed to have some  tightness posteriorly with a 10 mm insert.  Therefore, we removed an  additional 2 mm from the tibia.  We then tried it in flexion-extension.  Equal flexion-extension gap.  Inspected it posteriorly.  Recessed the  popliteus tendon as well for balancing.  We copiously irrigated, then placed  a 5 tibia with a 10 insert.  Slight hyper-extension was noted.  External  alignment guide was utilized.  It was extended medial to the tibial tubercle  and bisected the malleoli of the ankle.  This was marked in the proximal  tibia.  In flexion-extension, there was no lift off.  In  full flexion, there  was no subluxation.  The initial cut of the femur demonstrated more cut off  the condyle laterally than off the medial condyle in a typical ___piano_____  type fashion.  Next, the wound was copiously irrigated with pulsatile lavage  antibiotic irrigation.  We trialed the patella to 26 and drilled it 10 mm in  depth and the peg holes were applied as well to medialize the patella as  much as possible.  After copious irrigation and a sterile dressing applied,  we inspected it posteriorly and there was no loose debris noted or  osteophytes.  The knee was then flexed and dried intramedullary.  Gelfoam  was placed in the distal intramedullary portion of the tibia as a cement  block.  The proximal tibia was then dried thoroughly.  Cement was mixed in  the appropriate fashion, partially cured and applied to the intramedullary  portion of the tibia on the surface of the tibial plateau and on the heel of  the tibial tray.  It was then impacted in place and redundant cement  removed.  Next, the femoral cut bone at 7 was cemented on the posterior  runners and on the femoral condyle.  This was impacted in place and  redundant cement removed.  We placed first a 10 insert, reduced it, axial  load applied, redundant cement removed.  We cemented the 26 patella as well  as the patellar clamp and redundant cement removed.  After appropriate  curing of the cement, flexion-extension revealed probably more  hyperextension and slight instability with flexion of 0-30 degrees.  It was  therefore felt that a 12 would be a better fit.  This was applied and full  flexion and full extension without lift off or rotation.  A 12 was selected.  After subluxing the patella, we removed all redundant cement and irrigated  it, posteriorly flexed the knee and impacted a 12 mm posterior cruciate  sacrificing polyethylene insert and checked if it was seated well, reduced it again in full range.  Good range,  excellent alignment was noted.  Full  extension and 140 flexion.  Had to perform a lateral retinacular release  though to medialize the patella.  Following this and clamping of the  patella, there was good tracking of the patella and in full flexion and  extension there was no dislocation, subluxation, or clunking noted.  A  Hemovac was therefore placed in the wound and brought out through a lateral  stab wound in the skin.  Good flexion 140 and extension, good stability in  varus-valgus stress  0-30 degrees.  After the Hemovac was placed, a #1 Vicryl  and figure-of-eight suture was utilized again for the arthrotomy repair and  good flexion-extension and tracking of the patella was noted.  Subcutaneous  tissue was reapproximated with 2-0 Vicryl sutures in slight flexion.  Skin  was reapproximated with staples.  Flexion 130 and full extension was noted  after closure.  The wound was dressed sterilely and after 2-hour mark, the  tourniquet was deflated and adequate vascularization of lower extremity was  appreciated.   The patient tolerated the procedure well with no complications.  Tourniquet  time:  2 hours.  Assistant was Roma Schanz, P.A.      JB/MEDQ  D:  08/26/2004  T:  08/26/2004  Job:  [id number]

## 2010-12-18 NOTE — Op Note (Signed)
[name], [name]               ACCOUNT NO.:  [id number]   MEDICAL RECORD NO.:  [id number]          PATIENT TYPE:  INP   LOCATION:  5025                         FACILITY:  [facility]   PHYSICIAN:  Feliberto Gottron. [name], M.D.   DATE OF BIRTH:  [birth year]   DATE OF PROCEDURE:  12/16/2005  DATE OF DISCHARGE:                                 OPERATIVE REPORT   PREOPERATIVE DIAGNOSIS:  Methicillin sensitive Staph aureus infection, left  total knee.   POSTOPERATIVE DIAGNOSIS:  Methicillin sensitive Staph aureus infection, left  total knee.   PROCEDURE:  Thorough irrigation and debridement with radical synovectomy of  left total knee and revision of tibial polyethylene component, placement of  large bore Hemovac drains.   SURGEON:  Feliberto Gottron. [name], M.D.   FIRST ASSISTANT:  Erskine Squibb B. Su Hilt, P.A.-C.   ANESTHETIC:  General endotracheal.   ESTIMATED BLOOD LOSS:  Minimal.   FLUID REPLACEMENT:  850 mL crystalloid.   TOURNIQUET TIME:  1 hour 10 minutes.   URINE OUTPUT:  250 mL.   INDICATIONS FOR PROCEDURE:  65 year old woman who underwent a primary total  knee by another physician in January 2006, developed arthrofibrosis, failed  closed manipulation six weeks later and presented to me with an  arthrofibrotic left total knee about 8-10 weeks ago. Options were discussed  with the patient.  She never had any wound problems. By x-ray, she had a  slightly oversized femoral component. I was actually the third opinion, one  of her first surgeon's partners also told that the femoral component may be  slightly oversized. In any event, she was taken for revision left total knee  where the femoral component was downsized from [place] 7 to an Osteonics 5  and initially she did well postoperatively regaining very good range of  motion.  She had a little bit of a superficial wound problem around the  fourth week after surgery.  She received a course of p.o. antibiotics and  the cellulitis seemed to settle  down.  She never had any significant  drainage. She developed arthrofibrosis again as documented last week when  her flexion had gone from about 95 to 70 degrees and she had about a 40  degrees flexion contracture.  Because of this closed manipulation under  anesthesia was recommended. She remained afebrile.  She actually came off of  her antibiotics last week there was no change in the appearance of her knee  which remained cool to touch and essentially little if any effusion.  She  was taken for a closed manipulation under general anesthesia on Dec 15, 2005, and we easily got her to 0 degrees and flexed her to about 115  degrees. At the same time, because she was asleep, I put a needle in her  knee and got 1 mL of joint fluid back that had a slightly cloudy appearance,  but was also mixed with blood and this was all I could out of the knee at  the time I did the aspiration on Wednesday.  Wednesday night, the gram stain  came back showing gram-positive cocci in  pairs, grew out Staph aureus that,  as of the time of this dictation, looks like it is methicillin-sensitive.  I  was concerned that this may be a lab error.  I went ahead and aspirated her  again yesterday and got out 4 mL of blood that had a cell count of 100,000  that was primarily polys and because of these two aspirations, she is taken  to the operating room today for radical irrigation and debridement, revision  of the polyethylene spacer primarily to gain better access to the posterior  aspect of the knee, and do a posterior synovectomy. She is aware that this  has a 2 out of 3 chance of salvaging the total knee.  We will place a large  bore Hemovac drain. She has been seen by Dr. Cliffton Asters of the infectious  disease service and she is presently on vancomycin and Rifampin pending  final culture results.   DESCRIPTION OF PROCEDURE:  The patient was identified by armband and taken  to the operating room at [facility].  Appropriate anesthetic  monitor attached and general endotracheal anesthesia induced.  A Foley  catheter was placed.  A lateral post applied to the table and foot  positioner and the left lower extremity prepped and draped in the usual  sterile fashion from the ankle to the mid thigh.  With the limb elevated,  the tourniquet was inflated to 350 mmHg.  We made an anterior midline  incision recreating the old total knee incision and undermined medially  allowing [place] to perform a medial parapatellar arthrotomy. The fluid inside  the knee was primarily bloody. We already had two good sets of cultures, so  no further cultures were sent. The patella was everted and we set about  doing fairly radical anterior, medial, and lateral synovectomies.  The  polyethylene placer was snapped out allowing [place] to access to posterior  aspect of the knee where we also removed inflamed synovium from the  posterior aspect of the knee.  The components, themselves, were carefully  checked for any evidence of loosening and there was none.  After we  performed the synovectomy, we went ahead and irrigated the knee out with  pulse lavage normal saline.  This was followed by two minutes of bathing the  metal and plastic components in Clorpactin solution to strip the biofilm off  of the metal and cement followed by hydrogen peroxide wash followed by more  Clorpactin.  The knee was then thoroughly irrigated once again with pulse  lavage solution and a large bore Hemovac drain was placed in the wound.  The  parapatellar arthrotomy was closed with 0 running Vicryl suture.  The  subcutaneous tissue and skin were closed with vertical mattress 0 nylon  suture.  A dressing of Xeroform, 4x4 dressing sponges, Webril, Ace wrap, and  knee immobilizer were applied and the large bore Hemovacs were then placed to suction. The tourniquet was let down.  The patient was awakened and taken  to the recovery room without  difficulty.      Feliberto Gottron. [name], M.D.  Electronically Signed     FJR/MEDQ  D:  12/17/2005  T:  12/18/2005  Job:  [id number]

## 2010-12-18 NOTE — Discharge Summary (Signed)
[name], [name]               ACCOUNT NO.:  [id number]   MEDICAL RECORD NO.:  [id number]          PATIENT TYPE:  INP   LOCATION:  5015                         FACILITY:  [facility]   PHYSICIAN:  Feliberto Gottron. [name], M.D.   DATE OF BIRTH:  14-Jul-1946   DATE OF ADMISSION:  10/27/2005  DATE OF DISCHARGE:  10/30/2005                                 DISCHARGE SUMMARY   PRIMARY DIAGNOSIS FOR THIS ADMISSION:  Painful left total knee arthroplasty.   PROCEDURE WHILE IN HOSPITAL:  Revision, left total knee arthroplasty.   DISCHARGE SUMMARY:  The patient is a 65 year old female with painful left  total knee arthroplasty.  She has had pain since her total knee was done in  January 2006.  Never regained full range of motion.  No signs of infection  or history.  X-rays show oversized femur with large retained patella.  The  patient wishes to undergo revision of left total knee arthroplasty after  discussing risks versus benefits.   No known drug allergies.   MEDICATIONS:  Include Vicodin, lorazepam and lisinopril.   PAST MEDICAL HISTORY:  1.  Childhood diseases.  2.  Adult history:      1.  Hypertension.      2.  DJD.   PAST SURGICAL HISTORY:  1.  Left total knee in 2006.  2.  Hysterectomy.  3.  Bladder surgery.  4.  Lower back surgery.  5.  Ankle reconstruction.  No difficulties with __________  except for      respiratory depression post-op.   SOCIAL HISTORY:  No tobacco.  No ethanol.  No IV drug abuse.  She is a  retired Runner, broadcasting/film/video.   FAMILY HISTORY:  Father died at 56 with positive history of MI and  hypertension.  Mother died at age 49 with a history of MI and hypertension.   REVIEW OF SYSTEMS:  Positive for upper and lower dentures with lower  implants.  Denies any shortness of breath, chest pain or recent illness.   PHYSICAL EXAMINATION:  VITAL SIGNS:  Temperature 98.3, pulse 94,  respirations 16, blood pressure 128/78.  GENERAL APPEARANCE:  She is a 5-foot-2, 172-pound female who  is  normocephalic, atraumatic.  EARS:  TMs are clear.  EYES:  Pupils are equal, round and reactive to light and accommodation.  NOSE:  Patent.  THROAT:  Benign.  NECK:  Supple.  Full range of motion.  CHEST:  Clear to auscultation and percussion.  HEART:  Regular rate and rhythm.  ABDOMEN:  Soft and nontender.  EXTREMITIES:  Left knee has a well-healed normal scar with positive limp.  Range of motion from 15-95 degrees.  Positive pain.  No effusion.  No signs  of infection.   X-RAY:  Showed no obvious loosening.   PREPARATORY LABORATORY:  Including CBC, CMET, chest x-ray, EKG, PT and PTT  were within normal limits with the exception of EKG which showed left bundle  branch block.  ALT of 36.   HOSPITAL COURSE:  On the date of admission, the patient was taken to the  operating room at [facility] where  she underwent a little total knee  revision of femoral component that was one size too large and a revision of  tibial spacer.  A #7 femoral component was replaced with a #5 Osteonics  supracondylar femoral component, then the 10-mm spacer was swapped out for a  new one.  The femoral component was placed with cement.  The patient was  placed on perioperative antibiotics.  She was placed on postoperative  Coumadin prophylaxis.  A medium Hemovac drain was placed as was a Foley.  Physical therapy was begun the first postoperative day.  Postoperative day  1, the patient was complaining of moderate pain.  She had no nausea or  vomiting.  She was afebrile.  Vital signs were stable.  Drain output 150 mL.  Hemoglobin 9.8, WBC 8.9, PT of 13.6.  The wound was clean and dry.  She  continued with the CPM and Coumadin prophylaxis per pharmacy protocol.  Postoperative day 2, the patient was not complaining of nausea or vomiting.  T max 100.7, ranging , hemoglobin 9.4, WBC 10.0, PT 15.7.  Drain output 25  mL q. shift.  Urine output 350 mL q. shift.  Cultures were negative x 2  days.  Hemovacs were  discontinued as was their Foley.  Her PCA was  discontinued as well.  She continued to make steady progress in physical  therapy.  Postoperative day 3, unchanged.  She was afebrile at the time of  rounding.  Hemoglobin 9.3, INR 2.2.  Because she had met her physical  therapy goals and was transferring independently and walking up and down the  halls, she was discharged home in the care of her family.  At the time of  her discharge, she was medically stable and improved.  Diet at the time of  discharge is regular.  The patient should resume her lorazepam and  lisinopril.  She is given Percocet for pain.  She will return to see [place] in  one week's time.  Activity is weightbearing as tolerated with total knee  precautions.  She should return sooner should she have any difficulties with  increased drainage or other signs of infection.  She is also given  prescriptions for Robaxin and Coumadin, which will be followed by her home  health agency for the next two weeks as well as iron supplementation.  Dressing changes as needed.      Laural Benes. Jannet Mantis.      Feliberto Gottron. [name], M.D.  Electronically Signed    JBR/MEDQ  D:  12/31/2005  T:  01/01/2006  Job:  [id number]

## 2010-12-18 NOTE — Discharge Summary (Signed)
[name], [name]               ACCOUNT NO.:  [id number]   MEDICAL RECORD NO.:  [id number]          PATIENT TYPE:  INP   LOCATION:  5003                         FACILITY:  [facility]   PHYSICIAN:  Feliberto Gottron. [name], M.D.   DATE OF BIRTH:  10-01-1945   DATE OF ADMISSION:  07/18/2006  DATE OF DISCHARGE:  07/22/2006                               DISCHARGE SUMMARY   PRIMARY DIAGNOSIS FOR THIS ADMISSION:  Status post infected left total  knee arthroplasty.   PROCEDURE WHILE IN THE HOSPITAL:  Reimplantation of left total knee  components.   DISCHARGE SUMMARY/HISTORY OF PRESENT ILLNESS:  The patient is a 65-year-  old woman who had downsizing of a femoral component that was oversized,  and this was performed last year.  Postoperatively, the patient  developed a staphylococcus infection that failed to synovectomy and  washout.  Had removal of components and placement of PMMA spacer four  months prior to this admission.  Since then, her sedimentation rate and  CRP have dropped.  She had six weeks of IV antibiotics and six weeks of  p.o. antibiotics.  The wound remained benign and well healed.  It was  aspirated last week which came back negative, and she has remained  afebrile.  She desires revision, removal of the PMMA spacer, and  revision to another total knee.  The risks and benefits of the procedure  were discussed, and she was well prepared having discussed  risks versus  benefits.   ALLERGIES:  No known drug allergies.   MEDICATIONS AT TIME OF ADMISSION:  Vicodin and lisinopril.   PAST MEDICAL HISTORY:  Usual childhood diseases.  Adult History:  DJD  and hypertension.   SURGICAL HISTORY:  Left heel main on August 26, 2004, revision March 21, 2006 and difficulty to date.   SOCIAL HISTORY:  No tobacco, no ethanol, or IV drug abuse.  She is a  widowed school history.   FAMILY HISTORY:  Mother died at 74 of MI, father died at 43 secondary to  MI.   REVIEW OF SYSTEMS:  Does not  wear glasses.  The patient denies any chest  pain, shortness of breath, no recent illness.   The patient is a 5 feet 265-pound female.  She has upper and lower  dentures and head is otherwise normocephalic, atraumatic.  EARS:  TM's  are clear, and pupils are equally round and reactive to light.  Eyes are  patent and clear.  NECK:  Supple, full range of motion.  LUNGS:  Clear to auscultation and percussion.  HEART:  Regular rate and rhythm.  ABDOMEN:  Soft and nontender.  EXTREMITIES:  The patient has numbness of the left hand.  The left knee  is stable.   Preoperative laboratory data and cultures from recent aspiration were  negative per above.  Preoperative laboratory data including CBC, CMET,  chest x-ray, EKG, PT and PTT were all normal limits.   HOSPITAL COURSE:  On the day of admission, the patient was taken to the  operating room at [facility] where she underwent a total knee removal  of PMMA spacer and revision to left knee total arthroplasty with an S-  ROM posterior stabilizer rotating bearing total knee arthroplasty normal  size small left femur 31 mL cone and 1,100 x stem.  The tibial size  small, tibia 37-mm cone 99 x 100 mm stem and a 10-mm rotating platform  posterior stabilized bearing.  All components cemented with a double  batch of DePuy cement with a 1,500 mg of Zinacef.  The medium Hemovac  double arm was placed into the wound.  Foley catheter was placed  preoperatively.  Intraoperatively, Gram stain came back negative for any  organisms.   The patient was placed on preoperative antibiotics.  She was placed on  postoperative PCA Dilaudid for pain control and placed on Coumadin  prophylactically with bridging with Lovenox per pharmacy protocol.   Postoperative day 1, the patient was awake, alert, in moderate pain.  Hemoglobin 10.1, WBC 9.4, vital signs are stable, PT 13.8.  Cultures  remained no growth.  Urine output 450 cc.   Postoperative day 2, the  patient was without complaint, she was  afebrile, vital signs were stable.  Hemoglobin was 8.9, WBC 9.5, INR  1.4.  Dressing was dry.  The PCA was discontinued.   Postoperative day three, the patient was making progress in physical  therapy, decrease in pain, vital signs were stable, she was afebrile.  The wound was clean and dry.  Hemoglobin 8.6, WBC of 8.4, INR 2.1.   Postoperative day four, the  patient was without complaint and ready to  go home.  She was afebrile, hemoglobin 8.2, WBC 7.5, INR 2.4.  Dressing  was dry and was benign.  She was discharged home to the care of her  family.  She will return to clinic on July 29, 2006, calling for an  appointment, coming in sooner if she has any increase in temperature,  drainage, or her pain.  Diet is regular.  Activity weightbearing as  tolerated with walker.  Dressing changes daily as needed.  Medications:  Percocet, Coumadin x2 two weeks with an INR of 1.52.  Keflex 500 mg  b.i.d.  Home medications per home recreation sheet.      [name]. [name].      Feliberto Gottron. [name], M.D.  Electronically Signed    JBR/MEDQ  D:  09/26/2006  T:  09/26/2006  Job:  [id number]

## 2010-12-18 NOTE — Op Note (Signed)
[name], [name]               ACCOUNT NO.:  [id number]   MEDICAL RECORD NO.:  [id number]          PATIENT TYPE:  INP   LOCATION:  2899                         FACILITY:  [facility]   PHYSICIAN:  Feliberto Gottron. [name], M.D.   DATE OF BIRTH:  Dec 14, 1945   DATE OF PROCEDURE:  10/27/2005  DATE OF DISCHARGE:                                 OPERATIVE REPORT   PREOPERATIVE DIAGNOSIS:  Arthrofibrosis left total knee.   POSTOPERATIVE DIAGNOSIS:  Arthrofibrosis left total knee.   PROCEDURE:  Right total knee revision of femoral component that was one size  too large and revision of tibial spacer.  We removed a #7 femoral component  and replaced it with a #5 Osteonics Scorpio femoral component and the 10-mm  spacer was swapped out for a new 10-mm spacer.   SURGEON:  Feliberto Gottron. [name], M.D.   ASSISTANT:  Vivia Ewing, PA-C.   ANESTHETIC:  General endotracheal.   ESTIMATED BLOOD LOSS:  300 mL.   FLUID REPLACEMENT:  A liter of crystalloid.   URINE OUTPUT:  300 mL.   TOURNIQUET TIME:  About 40 minutes.   INDICATIONS FOR PROCEDURE:  The patient is a 65 year old woman who underwent  a primary left total knee arthroplasty using cemented Osteonics Scorpio  components a year ago, developed arthrofibrosis, failed to closed  manipulation, and presented to our clinic for evaluation with a range of  motion from about 30-70 degrees; despite extensive physical therapy and  closed manipulation.  The components were well angled well-fixed.  The  femoral component looked to be one size larger than optimal, and hence the  diagnosis of an overstuffed total knee.  In order to enhance motion and  enhance function she is taken for revision of the left total knee  arthroplasty, downsizing the femoral component from the Osteonics #7 to an  Osteonics #5; and also shortening the femur a little bit to help get her  into full extension and also to enhance her flexion.  The risks and benefits  of surgery were well  understood by the patient.   DESCRIPTION OF PROCEDURE:  The patient identified by armband, taken the  operating room at [facility].  Appropriate anesthetic monitors were  attached and general endotracheal anesthesia induced with the patient supine  position.  Tourniquet applied high to the left thigh.  Left lower extremity  prepped and draped in the usual sterile fashion from the ankle to the mid  thigh.  After placement of a foot positioner and lateral post.  Without  using the tourniquet, we went ahead and reproduced the anterior approach to  the left knee starting a handbreadth above the patella and going a  handbreadth distal to the patella utilizing the old scar from the anterior  incision a year ago.  Small bleeders in the skin and subcutaneous tissue  were identified and cauterized.  A medial parapatellar arthrotomy was then  developed; and joint fluid sent off for gram stain and culture coming back  negative for organisms.  The superficial medial collateral ligament was then  elevated off the proximal tibia leaving  it intact distally.  The patella was  everted and a fair amount of scar tissue was removed from about the patella  as well as a fairly complete synovectomy from the anterior, medial, and  lateral parts knee.  This allowed [place] remove the 10-mm polyethylene bearing  and get the knee up to about 120 degrees of flexion, enhancing the exposure.  We also removed scar tissue from the notch.   The femoral component was well-fixed.  It did overhang the femur medially  and laterally; and it was then removed using the thin osteotomes to break  the cement metal interface; and the extractor to remove it.  The bone  quality remaining was quite good, and we simply placed in the intramedullary  rod set at 5 degrees left; and removed 2 more millimeters of bone from the  distal femur.  We then also removed scar tissue from the posterior aspect of  the knee including some remaining  bone from the posterior aspect of the  lateral femoral condyle.  We then set up for a #5 anterior-posterior and  chamfer cutting guide utilizing the old drill holes from the #7 femoral  component; and performed our anterior-posterior and chamfer cuts without  difficulty; and placed a #5 Osteonics trial femoral component on the femur  and a new 10-mm bearing.  The knee now came to full extension with a slight  amount of hyperextension and would easily flex to 125 degrees with good  ligamentous stability.  We then carefully evaluated the patellar component  and found it to be well-fixed; and this was not revised.   At this point the trial components were removed.  We carefully tapped on the  tibial component, and put the tibial extractor on it and it was well-fixed  the tibial baseplate was left in place.  At this point the leg was wrapped  with an Esmarch bandage and a tourniquet inflated to 350 mmHg and all bony  surfaces were water picked clean and dried with suction and sponges. A  single batch of [place] polymethyl methacrylate cement with 750 mg of  Zinacef was then mixed, applied to all bony and metallic meeting surfaces of  the femoral component except for the posterior condyles of the femur.  We  then hammered into place the femoral component and removed excess cement  with Personal assistant.  The 10-mm spacer was then snapped into the tibial  tray, the knee placed in full extension; and then flexed 5 degrees as  compression was applied and the cement hardened.  We then took the knee  through a range of motion and found excellent patellar tracking; and, once  again, the wound was water picked clean and dried with suction.  Medium and  large bore Hemovac drains were then placed in from the lateral side of the  knee joint.  The parapatellar arthrotomy closed with running #1 Vicryl suture.  The subcutaneous tissue with #0 and 2-0 undyed Vicryl suture, and  the skin with skin staples.  A  dressing of Xeroform 4 x 4 dressing, sponges,  Webril, and an Ace wrap applied.  The tourniquet let down.  The patient was  then awakened and taken to the recovery room without difficulty.      Feliberto Gottron. [name], M.D.  Electronically Signed     FJR/MEDQ  D:  10/27/2005  T:  10/28/2005  Job:  [id number]

## 2010-12-18 NOTE — Op Note (Signed)
[name], [name]               ACCOUNT NO.:  [id number]   MEDICAL RECORD NO.:  [id number]          PATIENT TYPE:  INP   LOCATION:  5033                         FACILITY:  [facility]   PHYSICIAN:  Feliberto Gottron. [name], M.D.   DATE OF BIRTH:  03/23/1946   DATE OF PROCEDURE:  03/21/2006  DATE OF DISCHARGE:                                 OPERATIVE REPORT   PREOPERATIVE DIAGNOSIS:  Chronic infection, left total knee.   POSTOPERATIVE DIAGNOSIS:  Chronic infection, left total knee.   PROCEDURE:  Left total knee radical irrigation debridement after removal of  all total knee parts and retained cement.   SURGEON:  Feliberto Gottron. [name], M.D.   ASSISTANT:  Skip Mayer, PA-C   ANESTHESIA:  General endotracheal.   ESTIMATED BLOOD LOSS:  Minimal.   FLUID REPLACEMENT:  1200 mL crystalloid.   DRAINS PLACED:  Two large bore medium Hemovacs and a Foley catheter.   INDICATIONS FOR PROCEDURE:  65 year old woman who underwent revision of her  slightly oversized femoral component I believe back in January 2007,  developed a postoperative infection, was treated with a radical irrigation  and debridement and retention of parts and unfortunately went on to continue  to be infected with swelling and pain despite the radical irrigation and  debridement 6 weeks of antibiotics and p.o. antibiotics and she came off her  Rifampin few weeks ago, increased swelling and pain and fever.  We went  ahead and aspirated her knee and only got about a few drops of blood but  once again it grew out methicillin-sensitive staph aureus which was the  infecting organism back after the revision and because of this we  recommended removal of the components, radical irrigation and debridement,  and placement of a polymethyl methacrylate spacer.  This was discussed at  length with the patient.  She was prepared for surgical intervention.   DESCRIPTION OF PROCEDURE:  The patient was identified by armband, taken to  the operating  room at [facility].  Appropriate anesthetic monitors  were attached and general endotracheal anesthesia induced with the patient  in supine position.  Tourniquet was applied high to the left thigh and the  left lower extremity prepped, draped usual sterile fashion from the ankle to  the tourniquet.  Lateral post and foot positioner were applied to the table.  The limb was elevated for about 5 minutes and the tourniquet inflated.  We  began the procedure by recreating the anterior midline incision through the  skin and subcutaneous tissue and then performed a standard medial  parapatellar arthrotomy.  The tissue was quite inflamed.  The synovium was  very inflamed and this was resected sharply with the knife and the  electrocautery.  Once we had resected most of the inflamed synovium, we  elevated superficial medial collateral ligament off of the anterior aspect  of the tibia and peeled it around posteromedially to enhance her exposure of  the total knee.  We continued to remove inflamed synovium and tissues  wherever we found it.  Fortunately, there was little if any necrosis.  We  were then able to flex the knee up enough where we could get out the  polyethylene spacer and continued to remove scar tissue.  We were finally  able to rotate the tibia out from underneath the femur and using a drift,  were able to the [employer] tibial component off of the cement mantle and  using rongeurs, curettes, drills and the power saw, removed the rest of the  cement from the proximal tibia.  We then directed our attention to the  femoral component and using thin osteotomes removed it from the distal femur  and once again using rongeurs, curettes and the water pick, removed the rest  of the cement from the distal femur and the patella was removed in a similar  fashion.  We continued to remove inflamed synovial tissue and at two or  three points during the operation irrigated out with pulse lavaged  normal  saline solution.  Once we were completely satisfied that all the inflamed  necrotic tissue had been removed as well as the cement, we went ahead and  mixed a quadruple batch of [place] polymethyl methacrylate cement with 4.8  grams of tobramycin and with the leg held in extension, placed a spacer  between the proximal tibia and the femur.  There was approximately 15 mm in  thickness and conformed to the distal femur and the tibia with a small stem  that went down the tibia maybe a centimeter.  A smaller ball of cement was  used to conform to the back side of the patella, creating a patellar  component as well.  Once the cement cured, large bore Hemovac drains were  placed in the wound which was then closed in layers with running #1 Vicryl  suture to dissolved in the parapatellar arthrotomy and then the skin and  subcutaneous tissue were closed with vertical mattress 2-0 nylon suture and  staples.  A dressing of Xeroform, 4x4 dressing sponges, Webril and Ace wrap  applied.  The tourniquet was let down.  The patient was then awakened and  taken to recovery without difficulty.      Feliberto Gottron. [name], M.D.  Electronically Signed     FJR/MEDQ  D:  03/22/2006  T:  03/22/2006  Job:  [id number]

## 2010-12-18 NOTE — Discharge Summary (Signed)
[name], [name]               ACCOUNT NO.:  [id number]   MEDICAL RECORD NO.:  [id number]          PATIENT TYPE:  INP   LOCATION:  0477                         [facility]ACILITY:  [facility]   PHYSICIAN:  Jene Every, M.D.    DATE OF BIRTH:  August 31, 1945   DATE OF ADMISSION:  08/26/2004  DATE OF DISCHARGE:  08/29/2004                                 DISCHARGE SUMMARY   ADMISSION DIAGNOSES:  1.  Hypertension.  2.  Degenerative joint disease, left knee.   DISCHARGE DIAGNOSES:  1.  Degenerative joint disease, left knee, status post left total knee      arthroplasty.  2.  Hypertension.  3.  Electrocardiogram revealing left bundle branch block.  4.  Episode of hypoxia and decreased respiratory rate.  5.  Asymptomatic postoperative anemia.  6.  Asymptomatic mild hyponatremia.   PROCEDURES:  The patient was taken to the OR on August 26, 2004, to undergo  a left total knee arthroplasty.   SURGEON:  Dr. Jene Every.   ASSISTANT:  Roma Schanz, PA-C.   ANESTHESIA:  General.   COMPLICATIONS:  None.   CONSULTS:  PT/OT case management.  Medical consult from Dr. Jomarie Longs.   HISTORY:  [name] is a 65 year old female who has noted disabling left  knee pain for quite some time.  She has undergone multiple cortisone  injections as well as physical therapy and tried anti-inflammatories with  little relief of her symptoms.  X-rays do reveal a tricompartmental  osteoarthritis.  It was felt at this point that the patient would benefit  from a total knee arthroplasty on the left.  The risks and benefits of the  surgery were discussed with the patient.  She did obtain medical clearance.  We will proceed.   LABORATORY DATA:  Preoperative white cell count is 8.4, hemoglobin 13.9,  hematocrit 41.4.  Routine CBCs were followed throughout the hospital course.  The patient's hemoglobin did drop to a level of 10.0 with hematocrit 29.4 at  the time of discharge; however, she is asymptomatic.  White  blood cell count  is 10.7, slightly elevated.  Coagulation studies and parameters show a PT of  12.3, INR 0.9.  These were followed throughout the hospital course.  At the  time of discharge, the patient's PT is 18.1, INR of 1.8.  Routine  chemistries done preoperatively show a sodium of 138, potassium 4.6 and  slightly elevated glucose of 104.  These were followed throughout the  hospital course.  At the time of discharge, sodium is 134, potassium 3.5  with glucose of 107.  Routine liver function tests show a slightly decreased  ALP at 31.  Cardiac enzymes were ordered during her hospital stay.  She did  have a slight elevation in CK to a level of 218, CK-MB of 4.7 with a  relative index of 2.2.  Preoperative urinalysis showed amber-colored urine  with small bilirubin, trace ketones and small leuk esterase, however, only 0-  2 wbc's seen per high-powered field.  Blood type is O+.  Preoperative EKG  showed new-onset left bundle branch block.  This was  repeated on the 25th  and showed ST and T wave abnormalities, which was similar to her previous  EKG performed.  I do not see a preoperative chest x-ray.   HOSPITAL COURSE:  The patient was admitted.  She was taken to the OR and  underwent the above-stated procedure without difficulty.  She was then  transferred to the PACU and then to the orthopedic floor for continued  postoperative care.  Postoperatively, a Hemovac drain was placed.  She was  placed on PCA analgesics.  The day of surgery, the patient did develop some  hypoxia with decreased respiratory rate when she was sleeping.  The nurses  notified the PA on call, who came in to see the patient and initiated Narcan  as well as close observation.  With waking, the patient's respiratory rate  did improve with increase in her O2 saturation.  The patient's PCA  medications were discontinued, and medical consult was obtained.  This was  carried out by Dr. Jomarie Longs, who worked the patient up  accordingly.  He found  no abnormalities and signed off fairly early in her hospital course.  Postoperatively, the patient did well.  We were able to discontinue her PCA  fairly early.  She advanced well with her therapy.  Incision remained clean  and dry.  She developed some slight fever; however, this was treated with  incentive spirometer.  She had a slight drop in her hemoglobin, which  remained asymptomatic throughout the hospital course.  Coumadin was  continued per the pharmacy for DVT prophylaxis.  The patient continued to do  well postoperatively, advancing with her therapy.  It was felt that she  could be discharged home with home health arrangements made.   DISPOSITION:  The patient is discharged home with home health.   DISCHARGE MEDICATIONS:  Include Coumadin, Robaxin, Vicodin and Colace.   ACTIVITY:  To walk as tolerated.  She elevate and ice the leg six times a  day for 20 minutes at a time.  Continue to use the knee immobilizer when  walking or until can straight leg raise x 10.  She is to change her dressing  daily.  It is okay for her to shower.   DIET:  As tolerated.   She has a follow-up appointment with Dr. Shelle Iron in approximately 10-14 days.   CONDITION ON DISCHARGE:  Stable.   FINAL DIAGNOSIS:  Status post left total knee arthroplasty.      CS/MEDQ  D:  09/10/2004  T:  09/10/2004  Job:  [id number]

## 2010-12-18 NOTE — Op Note (Signed)
[name], [name]               ACCOUNT NO.:  [id number]   MEDICAL RECORD NO.:  [id number]          PATIENT TYPE:  AMB   LOCATION:  DAY                          FACILITY:  [facility]   PHYSICIAN:  Jene Every, M.D.    DATE OF BIRTH:  1946-02-01   DATE OF PROCEDURE:  DATE OF DISCHARGE:                                 OPERATIVE REPORT   PREOPERATIVE DIAGNOSIS:  Arthrofibrosis, left  knee, status post total knee  replacement.   POSTOPERATIVE DIAGNOSIS:  Arthrofibrosis, left  knee, status post total knee  replacement.   PROCEDURE:  1.  Left examination under anesthesia.  2.  Manipulation under anesthesia.  3.  Intraarticular injection of Marcaine.   BRIEF HISTORY AND INDICATIONS:  This 65 year old is 8 weeks status post  right knee replacement.  She is having arthrofibrosis and has an extension  and flexion contracture.  Despite conservative treatment, she was unable to  improve this, even with dynamic splinting.  Operative intervention was  indicated for manipulation under anesthesia to improve her range of motion.  Risks and benefits were discussed including fracture, suboptimal range of  motion, need for repeat manipulation, etc.   DESCRIPTION OF PROCEDURE:  With the patient in the supine position, after  induction of adequate general anesthesia, we examined the knee under  anesthesia.  She did have about a 15-degree extension contracture.  Her  flexion was to 75 degrees.  Interestingly, with full relaxation, we were  able to achieve more extension as there was some tightness in her rectus and  quadriceps muscle.  With a gentle manipulative maneuver, we were able to  improve her range of motion from 70 degrees minus to the 120 degrees without  difficulty.  This was over an extended period of time with a gentle release  of the contracture.  In addition, an extension maneuver was performed, and  we were able to obtain full extension to slight hyperextension in comparison  to the  contralateral side.  I then thoroughly prepped the knee under sterile  conditions and injected with 10 mL of Marcaine, 0.25% with epinephrine.  Interestingly, as the anesthetic agent wore off, she developed tone into the  quadriceps and developed a recurrence in her extension contracture secondary  to tightness in her proximal quadriceps.   The patient was then awoken without difficulty and transported to recovery  in satisfactory condition.  The patient tolerated the procedure well with no  complications.      JB/MEDQ  D:  10/30/2004  T:  10/30/2004  Job:  [id number]

## 2010-12-18 NOTE — Discharge Summary (Signed)
[name], [name]               ACCOUNT NO.:  [id number]   MEDICAL RECORD NO.:  [id number]          PATIENT TYPE:  INP   LOCATION:  5025                         FACILITY:  [facility]   PHYSICIAN:  Feliberto Gottron. [name], M.D.   DATE OF BIRTH:  08/30/1945   DATE OF ADMISSION:  12/16/2005  DATE OF DISCHARGE:  12/22/2005                                 DISCHARGE SUMMARY   ADMISSION DIAGNOSES:  Primary diagnosis for this admission is infected left  total knee arthroplasty.   PROCEDURE WHILE IN HOSPITAL:  Incision and drainage of left total knee  arthroplasty and placement of PICC line.   DISCHARGE SUMMARY:  The patient is a 65 year old woman who underwent a  primary total knee by another physician in January 2006, developing  arthrofibrosis, failed closed manipulation 6 weeks later and presented to  Dr. [name] with an arthrofibrotic left total knee some 8-10 weeks prior to  this admission.  Options were discussed with the patent.  She never had any  wound problems post operatively from her first surgery.  The x-ray showed  she had a slightly oversized femoral component.  Dr. [name] was actually the  third opinion.  One of her first surgeon's partners had also told her that  the femoral component needed to be downsized.  In any event, she was taken  for revision of the total knee where the femoral component was downsized  from [place] 7 to an [place] 5 and initially she did well post  operatively, regaining very good motion.  She had a little bit of  superficial wound problem around the 4th week of surgery.  She received a  course of p.o. antibiotics and cellulitis seemed to calm down.  She never  had any significant drainage.  She went on to develop arthrofibrosis again  as documented last when her flexion had gone from 95 to 70 degrees and she  had a 40 degree flexion contracture.  Because of this, closed manipulation  under anesthesia was recommended.  She remained afebrile.  She also came  off  of her antiobiotic last week and there no change in appearance of the knee,  which remained cool to touch and essentially little if any effusion.  She  was taken for a closed manipulation under general anesthesia on Dec 15, 2005  where she was easily extended 0 degrees and flexed 115 degrees.  At the same  time, because the patient was asleep, her knee was aspirated and 1 milligram  of joint fluid with a slightly cloudy appearance was obtained and when it  was repeated 2 days later, the gram stain came back showing a gram positive  cocci in pairs which grew out to Staph aureus and at the time of her  admission it looked like a methicillin-sensitive Staph aureus.  Dr. [name]  was concerned that it may or may not be a lab error, so a re-aspiration was  done and 4 milliliters of blood with a cell count of 100,000 that was  primary __________ and because of these 2 aspirations, the patient will get  admitted for an  IND and revision of polyethylene spacer.   ALLERGIES:  NO KNOWN DRUG ALLERGIES.   CURRENT MEDICATIONS:  Keflex, rifampin, and hydrochlorothiazide.   PAST SURGICAL HISTORY:  Bladder tuck, vaginal hysterectomy, tonsils, tarsal  tongue release, and total joint distension.   FAMILY HISTORY:  Unremarkable.   SOCIAL HISTORY:  The patient lives by herself in [place].   REVIEW OF SYSTEMS:  Is positive for hypertension.  Otherwise, no other  fever, chills, or recent illness.   PHYSICAL EXAMINATION:  At the time of admission, the patient's vital signs  were 99.5 temperature, 70 pulse, respirations 14, exam showed her to be well  developed, well nourished 65 year old woman without any acute distress.  Head was normocephalic, atraumatic.  Ears and TMs clear.  Eyes:  Pupils are  equal, round, and reactive to light and accommodation.  Nose:  Patent  throughout and benign.  Neck is supple, full range of motion.  Chest is to  auscultation and percussion.  Heart: Regular rate and  rhythm.  Abdomen:  Soft, nontender.  Extremities: Show that left knee has 1-2 overall effusion  with positive redness.  The wound is otherwise intact.   LABORATORY DATA:  Preoperative labs including CBC, CMET, chest x-ray, EKG,  PT, and PTT were all within normal limits, with the exception of her EKG  which does show a left bundle branch block.  Hematocrit showed 31.5,  hemoglobin 7.6, glucose of 123, AOP of 37.   HOSPITAL COURSE:  On the day of admission, the patient was taken for an I&D  of the left total knee with radical synovectomy and revision of tibial  polyethelene component of the left knee with replacement of large bore  Hemovac drains.   Preoperative was begun under the advise of the infectious disease consult  that was obtained that day.  It was agreed that we would add a combination  of vancomycin IV and p.o. rifampin.  PCA pump was used for pain control.  Restarted Coumadin to prevent DVT with Lovenox bridge to cover.  Postoperative day 1:  The patient ws tolerating her antibiotics well while  she was afebrile.  Hemoglobin was 9.2, WBC of 10.1, her cultures had grown  out methicillin-sensitive Staph aureus.  She continued with her antibiotics  and total knee precautions as well Coumadin, Hemovac.  Postoperative day 2:  The patient was resting comfortably.  T-max was 100.1, hemoglobin 9.3, INR  1.2, drainage remained patent.  She continued with current care plan.  Postoperative day 3:  The patient was awake and alert.  Decreased pain.  Taking moderate p.o. without difficulty.  Vital signs are stable.  T-max of  100.3.  Wound was clean and dry.  Drain output:  Less than 50 cc's.  No  evidence of overt infection.  The patient continued on IV and p.o.  antibiotics.  PICC line had been placed.  Postoperative day 5:  The patient  was awake and alert.  Improved appetite.  T-max of 100.2.  Drain output of 20-10 cc per shift.  The patient had gotten up and walked with improved  gait  the previous day.  Her drain was discontinued.  Postoperative day 6:  The  patient was without complaints.  She is afebrile for more than 24 hours.  Vital signs are stable.  INR 1.4.  Dressing was dry.  Wound was benign.  No  drainage from the previous drain site.  She was otherwise stable  orthopedically after an IND of the left total knee  and was discharged home  in care of family.  She will have a home health RN for 6 weeks.  Total of IV  antibiotics using Ancef as well as Coumadin with a target INR of 1.5-2.  Other medications include rifampin, Tylox, and resume home medications.  Diet is regular.  Activity:  Walker with total knee precautions, weight  bearing as tolerated.  Return to clinic in approximately 6 days time or  sooner if she should have any increase in temperature or drainage, or  increasing pain.  She will follow up with an ID as per Dr. Blair Dolphin  instructions.  Dressing q. day or as needed.  Keep wound clean and dry.      Laural Benes. Jannet Mantis.      Feliberto Gottron. [name], M.D.  Electronically Signed    JBR/MEDQ  D:  02/09/2006  T:  02/10/2006  Job:  [id number]

## 2010-12-18 NOTE — Consult Note (Signed)
NAME:  PHENIX, GREIN               ACCOUNT NO.:  [id number]   MEDICAL RECORD NO.:  [id number]          PATIENT TYPE:  INP   LOCATION:  0477                         FACILITY:  [facility]   PHYSICIAN:  Theone Stanley, MD   DATE OF BIRTH:  1945-10-17   DATE OF CONSULTATION:  DATE OF DISCHARGE:                                   CONSULTATION   REASON FOR CONSULTATION:  Decrease in respiratory rate, upon patient  sleeping.   HISTORY OF PRESENT ILLNESS:  Ms. Weigel is a very pleasant 65 year old  female with a history of osteoarthritis of both knees for the past 10 years.  Her last knee became bad enough that it was felt that she would benefit from  a total knee repair.  She underwent this procedure today on the 25th.  Intraoperatively, she had a brief episode of hypertension; however, this was  controlled with medications.  Postoperative, however, her respiratory rate  went down to 8 while the patient was sleeping, and she would also desat;  however, when the patient was awakened by the nursing staff, her respiratory  rate and her oxygenation came up to a normal range.  Patient denies any  chest pain or shortness of breath, cough.  She states that she is not aware  of having any problems except when nursing staff informed her that she had a  low respiratory rate while sleeping.   PAST MEDICAL HISTORY:  Recent EKG revealed left bundle branch block, normal  sinus rhythm.  Hypertension.   MEDICATIONS:  Darvocet.  Mobic.   ALLERGIES:  None.   PAST SURGICAL HISTORY:  1.  The recent left total knee.  2.  Laminectomy x2.  3.  Hysterectomy.  4.  Tonsillectomy.   SOCIAL HISTORY:  Noncontributory.   FAMILY HISTORY:  Noncontributory.   REVIEW OF SYSTEMS:  No headache, lightheadedness, or dizziness.  No fevers  or chills.  No change in bowel habits, diarrhea, constipation, or blood in  her stools, dark or tarry stools.  No dysuria or hematuria.  No change in  heat or cold intolerance.   PHYSICAL EXAMINATION:  VITAL SIGNS:  Blood pressure 147/81, heart rate 91,  temperature 97.3, satting at 98% on 2 liters.  HEENT:  Head is normocephalic and atraumatic.  Eyes are 3 mm.  Pupils are  equal and reactive to light.  Extraocular movements are intact.  Ears and  nose without discharge.  Throat is clear  NECK:  Supple.  No lymphadenopathy.  LUNGS:  Clear to auscultation bilaterally.  HEART:  Regular rate and rhythm.  No murmurs, rubs or gallops appreciated.  ABDOMEN:  Soft, nontender, nondistended.  EXTREMITIES:  Patient's left leg was in apparatus.  Right leg had no edema.  Had good pulses.   LABS:  From January 23, white count was normal.  Hemoglobin 13, hematocrit  41, platelets 394.  Sodium 138, potassium 4.6, chloride 101, CO2 29, glucose  104.  BUN 21, creatinine 1.1.   ASSESSMENT/PLAN:  A pleasant 65 year old female presenting with depressed  respiratory rate.  Assessment #1:  Depressed respiratory rate:  This is  most likely secondary  to medications, specifically narcotics.  Patient received one dose of Narcan  and was not responsive; however, she received a second dose, and per nursing  staff, had improved, both in her saturations and her respiratory rate.  Would repeat Narcan if the patient had a decrease in her respiratory rate to  10.  Would decrease all sedating medications.  Will obtain an EKG and one  set of cardiac enzymes to eliminate any other possible causes.  Assessment #2:  Hypertension:  Continue with her current medications.      AEJ/MEDQ  D:  08/26/2004  T:  08/26/2004  Job:  [id number]   cc:   Jene Every, M.D.  [address]  [place]  [place] [address]  Fax: [phone]

## 2011-01-04 ENCOUNTER — Other Ambulatory Visit: Payer: Self-pay | Admitting: Orthopedic Surgery

## 2011-01-04 DIAGNOSIS — M542 Cervicalgia: Secondary | ICD-10-CM

## 2011-01-07 ENCOUNTER — Other Ambulatory Visit: Payer: Self-pay

## 2011-01-14 ENCOUNTER — Ambulatory Visit
Admission: RE | Admit: 2011-01-14 | Discharge: 2011-01-14 | Disposition: A | Discharge status: Home / Self Care | Payer: Medicare Other | Source: Ambulatory Visit | Attending: Orthopedic Surgery | Admitting: Orthopedic Surgery

## 2011-01-14 DIAGNOSIS — M542 Cervicalgia: Secondary | ICD-10-CM

## 2011-01-28 ENCOUNTER — Other Ambulatory Visit: Payer: Self-pay | Admitting: Family Medicine

## 2011-02-13 ENCOUNTER — Encounter: Payer: Self-pay | Admitting: Family Medicine

## 2011-02-17 ENCOUNTER — Ambulatory Visit (INDEPENDENT_AMBULATORY_CARE_PROVIDER_SITE_OTHER): Payer: Medicare Other | Admitting: Family Medicine

## 2011-02-17 ENCOUNTER — Encounter: Payer: Self-pay | Admitting: Family Medicine

## 2011-02-17 VITALS — BP 160/90 | HR 99 | Wt 197.0 lb

## 2011-02-17 DIAGNOSIS — I10 Essential (primary) hypertension: Secondary | ICD-10-CM

## 2011-02-17 MED ORDER — LISINOPRIL-HYDROCHLOROTHIAZIDE 20-25 MG PO TABS: 1.0000 | ORAL_TABLET | Freq: Every day | ORAL | Status: DC

## 2011-02-17 MED ORDER — METOPROLOL SUCCINATE ER 50 MG PO TB24: 50.0000 mg | ORAL_TABLET | Freq: Every day | ORAL | Status: DC

## 2011-02-17 NOTE — Assessment & Plan Note (Signed)
Her blood pressure is clearly elevated today but she feels it is from pain and stress. She is coming back in for lab work tomorrow and says that she will schedule an appointment to have a nurse blood pressure check first thing in the morning.

## 2011-02-17 NOTE — Progress Notes (Signed)
  Subjective:    Patient ID: [name], female    DOB: 04/23/1946, 65 y.o.   MRN: [id number]  Hypertension This is a chronic problem. The current episode started more than 1 year ago. The problem is unchanged. The problem is controlled. Pertinent negatives include no blurred vision, chest pain or shortness of breath. There are no associated agents to hypertension. Risk factors for coronary artery disease include no known risk factors. Past treatments include ACE inhibitors, diuretics and beta blockers. The current treatment provides mild improvement. There are no compliance problems.    She'll he recently had a right carpal tunnel release. She is still having some pain but overall feels much better. She is having her sutures removed tomorrow. She is still having significant pain in her left hand and is planning on surgery later in August. She says she did rush to get here and thinks it might be progressing her blood pressures elevated today.   Review of Systems  Eyes: Negative for blurred vision.  Respiratory: Negative for shortness of breath.   Cardiovascular: Negative for chest pain.       Objective:   Physical Exam  Constitutional: She is oriented to person, place, and time. She appears well-developed and well-nourished.  HENT:  Head: Normocephalic and atraumatic.  Cardiovascular: Normal rate, regular rhythm and normal heart sounds.   Pulmonary/Chest: Effort normal and breath sounds normal.  Neurological: She is alert and oriented to person, place, and time.  Skin: Skin is warm and dry.  Psychiatric: She has a normal mood and affect.          Assessment & Plan:

## 2011-02-18 LAB — LIPID PANEL
Cholesterol: 240 mg/dL — ABNORMAL HIGH (ref 0–200)
HDL: 46 mg/dL (ref >39)
LDL Cholesterol: 147 mg/dL — ABNORMAL HIGH (ref 0–99)
Triglycerides: 236 mg/dL — ABNORMAL HIGH (ref <150)

## 2011-02-19 ENCOUNTER — Telehealth: Payer: Self-pay | Admitting: Family Medicine

## 2011-02-19 LAB — COMPLETE METABOLIC PANEL WITH GFR
ALT: 24 U/L (ref 0–35)
Albumin: 4.3 g/dL (ref 3.5–5.2)
Alkaline Phosphatase: 26 U/L — ABNORMAL LOW (ref 39–117)
BUN: 22 mg/dL (ref 6–23)
CO2: 23 mEq/L (ref 19–32)
Calcium: 9.7 mg/dL (ref 8.4–10.5)
Creat: 1.13 mg/dL — ABNORMAL HIGH (ref 0.50–1.10)
GFR, Est African American: 58 mL/min — ABNORMAL LOW (ref >60)
GFR, Est Non African American: 48 mL/min — ABNORMAL LOW (ref >60)
Glucose, Bld: 96 mg/dL (ref 70–99)
Potassium: 5.2 mEq/L (ref 3.5–5.3)
Sodium: 139 mEq/L (ref 135–145)
Total Bilirubin: 0.4 mg/dL (ref 0.3–1.2)

## 2011-02-19 NOTE — Telephone Encounter (Signed)
Call pt: Kidney function is stable. Chol is high. Really needs to be on chol lowering med at bedtime. Let me know if OK with starting dna I will fax rx. Then recheck labs in 8 weeks.

## 2011-02-23 MED ORDER — PRAVASTATIN SODIUM 40 MG PO TABS: 40.0000 mg | ORAL_TABLET | Freq: Every day | ORAL | Status: DC

## 2011-02-23 NOTE — Telephone Encounter (Signed)
Pt notified and she is ok with starting a chol med. [facility] [address]

## 2011-03-26 ENCOUNTER — Other Ambulatory Visit: Payer: Self-pay | Admitting: *Deleted

## 2011-03-26 MED ORDER — PRAVASTATIN SODIUM 40 MG PO TABS: 40.0000 mg | ORAL_TABLET | Freq: Every day | ORAL | Status: DC

## 2011-06-26 ENCOUNTER — Other Ambulatory Visit: Payer: Self-pay | Admitting: Family Medicine

## 2011-08-16 ENCOUNTER — Other Ambulatory Visit: Payer: Self-pay | Admitting: *Deleted

## 2011-08-16 MED ORDER — METOPROLOL SUCCINATE ER 50 MG PO TB24: 50.0000 mg | ORAL_TABLET | Freq: Every day | ORAL | Status: DC

## 2011-08-16 MED ORDER — LISINOPRIL-HYDROCHLOROTHIAZIDE 20-25 MG PO TABS: 1.0000 | ORAL_TABLET | Freq: Every day | ORAL | Status: DC

## 2011-08-30 ENCOUNTER — Other Ambulatory Visit: Payer: Self-pay | Admitting: Family Medicine

## 2011-08-30 NOTE — Telephone Encounter (Signed)
Needs appointment

## 2011-09-02 ENCOUNTER — Other Ambulatory Visit: Payer: Self-pay | Admitting: *Deleted

## 2011-09-02 MED ORDER — METOPROLOL SUCCINATE ER 50 MG PO TB24: 50.0000 mg | ORAL_TABLET | Freq: Every day | ORAL | Status: DC

## 2011-09-02 MED ORDER — LISINOPRIL-HYDROCHLOROTHIAZIDE 20-25 MG PO TABS: 1.0000 | ORAL_TABLET | Freq: Every day | ORAL | Status: DC

## 2011-11-28 ENCOUNTER — Other Ambulatory Visit: Payer: Self-pay | Admitting: Family Medicine

## 2012-01-26 ENCOUNTER — Other Ambulatory Visit: Payer: Self-pay | Admitting: Family Medicine

## 2012-02-20 ENCOUNTER — Other Ambulatory Visit: Payer: Self-pay | Admitting: Family Medicine

## 2012-03-20 ENCOUNTER — Encounter: Payer: Self-pay | Admitting: Family Medicine

## 2012-03-20 ENCOUNTER — Ambulatory Visit (INDEPENDENT_AMBULATORY_CARE_PROVIDER_SITE_OTHER): Payer: Medicare Other | Admitting: Family Medicine

## 2012-03-20 VITALS — BP 132/74 | HR 70 | Wt 202.0 lb

## 2012-03-20 DIAGNOSIS — Z23 Encounter for immunization: Secondary | ICD-10-CM

## 2012-03-20 DIAGNOSIS — E785 Hyperlipidemia, unspecified: Secondary | ICD-10-CM

## 2012-03-20 DIAGNOSIS — Z1331 Encounter for screening for depression: Secondary | ICD-10-CM

## 2012-03-20 DIAGNOSIS — J029 Acute pharyngitis, unspecified: Secondary | ICD-10-CM

## 2012-03-20 DIAGNOSIS — Z9181 History of falling: Secondary | ICD-10-CM

## 2012-03-20 DIAGNOSIS — I10 Essential (primary) hypertension: Secondary | ICD-10-CM

## 2012-03-20 LAB — COMPLETE METABOLIC PANEL WITH GFR
AST: 20 U/L (ref 0–37)
Albumin: 4.5 g/dL (ref 3.5–5.2)
Alkaline Phosphatase: 27 U/L — ABNORMAL LOW (ref 39–117)
BUN: 28 mg/dL — ABNORMAL HIGH (ref 6–23)
Creat: 1.39 mg/dL — ABNORMAL HIGH (ref 0.50–1.10)
GFR, Est Non African American: 39 mL/min — ABNORMAL LOW
Glucose, Bld: 101 mg/dL — ABNORMAL HIGH (ref 70–99)
Total Bilirubin: 0.4 mg/dL (ref 0.3–1.2)

## 2012-03-20 LAB — LIPID PANEL
Cholesterol: 179 mg/dL (ref 0–200)
Total CHOL/HDL Ratio: 4.4 Ratio
Triglycerides: 219 mg/dL — ABNORMAL HIGH (ref <150)
VLDL: 44 mg/dL — ABNORMAL HIGH (ref 0–40)

## 2012-03-20 MED ORDER — LISINOPRIL-HYDROCHLOROTHIAZIDE 20-25 MG PO TABS: 1.0000 | ORAL_TABLET | Freq: Every day | ORAL | Status: DC

## 2012-03-20 MED ORDER — METOPROLOL SUCCINATE ER 50 MG PO TB24: 50.0000 mg | ORAL_TABLET | Freq: Every day | ORAL | Status: DC

## 2012-03-20 MED ORDER — PRAVASTATIN SODIUM 40 MG PO TABS: 40.0000 mg | ORAL_TABLET | Freq: Every day | ORAL | Status: DC

## 2012-03-20 NOTE — Progress Notes (Addendum)
  Subjective:    Patient ID: [name], female    DOB: 21-Dec-1945, 66 y.o.   MRN: [id number]  HPI Hypertension-well controlled. No complaints. No chest pain or shortness of breath. She's is taking her medications regularly. She does need refills today. She has not been seen for blood pressure him as to year. She is overdue for blood work.  She complains of a sore throat x2 days. No fever. No other cold symptoms. Is painful to swallow. She's been taking care of 2 of her grandchildren. No known exposure to strep throat. She is not taking anything for pain or discomfort.   Review of Systems     Objective:   Physical Exam  Constitutional: She is oriented to person, place, and time. She appears well-developed and well-nourished.  HENT:  Head: Normocephalic and atraumatic.  Right Ear: External ear normal.  Left Ear: External ear normal.  Nose: Nose normal.  Mouth/Throat: Oropharynx is clear and moist.       TMs and canals are clear. Scar on left TM. Mild cobblestoning in the posterior pharynx.  Eyes: Conjunctivae and EOM are normal. Pupils are equal, round, and reactive to light.  Neck: Neck supple. No thyromegaly present.  Cardiovascular: Normal rate, regular rhythm and normal heart sounds.   Pulmonary/Chest: Effort normal and breath sounds normal. She has no wheezes.  Lymphadenopathy:    She has no cervical adenopathy.  Neurological: She is alert and oriented to person, place, and time.  Skin: Skin is warm and dry.  Psychiatric: She has a normal mood and affect.          Assessment & Plan:  HTN- Well controlled. Due for CMP and lipids. F/U in 6 months.  Continue current regimen. Refill sent to pharmacy. Followup in 6 months.  Pharyngitis-will collect rapid strep. Rapid strep is negative. This likely viral based on exam today. We'll treat symptomatically. Call if not better in one week or spikes a fever.  Pneumonia vaccine updated today.  Fall assessment-score of 9,  moderate risk for falls. I will mail her a handout on how to reduce the risk of falls in the home.  Depression screen-PHQ 9 score of 0. Negative for depression.

## 2012-03-20 NOTE — Patient Instructions (Addendum)
Home Safety and Preventing Falls Falls are a leading cause of injury and while they affect all age groups, falls have greater short-term and long-term impact on older age groups. However, falls should not be a part of life or aging. It is possible for individuals and their families to use preventive measures to significantly decrease the likelihood that anyone, especially an older adult, will fall. There are many simple measures which can make your home safer with respect to preventing falls. The following actions can help reduce falls among all members of your family and are especially important as you age, when your balance, lower limb strength, coordination, and eyesight may be declining. The use of preventive measures will help to reduce you and your family's risk of falls and serious medical consequences. OUTDOORS  Repair cracks and edges of walkways and driveways.   Remove high doorway thresholds and trim shrubbery on the main path into your home.   Ensure there is good outside lighting at main entrances and along main walkways.   Clear walkways of tools, rocks, debris, and clutter.   Check that handrails are not broken and are securely fastened. Both sides of steps should have handrails.   In the garage, be attentive to and clean up grease or oil spills on the cement. This can make the surface extremely slippery.   In winter, have leaves, snow, and ice cleared regularly.   Use sand or salt on walkways during winter months.  BATHROOM  Install grab bars by the toilet and in the tub and shower.   Use non-skid mats or decals in the tub or shower.   If unable to easily stand unsupported while showering, place a plastic non slip stool in the shower to sit on when needed.   Install night lights.   Keep floors dry and clean up all water on the floor immediately.   Remove soap buildup in tub or shower on a regular basis.   Secure bath mats with non-slip, double-sided rug tape.    Remove tripping hazards from the floors.  BEDROOMS  Install night lights.   Do not use oversized bedding.   Make sure a bedside light is easy to reach.   Keep a telephone by your bedside.   Make sure that you can get in and out of your bed easily.   Have a firm chair, with side arms, to use for getting dressed.   Remove clutter from around closets.   Store clothing, bed coverings, and other household items where you can reach them comfortably.   Remove tripping hazards from the floor.  LIVING AREAS AND STAIRWAYS  Turn on lights to avoid having to walk through dark areas.   Keep lighting uniform in each room. Place brighter lightbulbs in darker areas, including stairways.   Replace lightbulbs that burn out in stairways immediately.   Arrange furniture to provide for clear pathways.   Keep furniture in the same place.   Eliminate or tape down electrical cables in high traffic areas.   Place handrails on both sides of stairways. Use handrails when going up or down stairs.   Most falls occur on the top or bottom 3 steps.   Fix any loose handrails. Make sure handrails on both sides of the stairways are as long as the stairs.   Remove all walkway obstacles.   Coil or tape electrical cords off to the side of walking areas and out of the way. If using many extension cords, have an electrician   put in a new wall outlet to reduce or eliminate them.   Make sure spills are cleaned up quickly and allow time for drying before walking on freshly cleaned floors.   Firmly attach carpet with non-skid or two-sided tape.   Keep frequently used items within easy reach.   Remove tripping hazards such as throw rugs and clutter in walkways. Never leave objects on stairs.   Get rid of throw rugs elsewhere if possible.   Eliminate uneven floor surfaces.   Make sure couches and chairs are easy to get into and out of.   Check carpeting to make sure it is firmly attached along stairs.    Make repairs to worn or loose carpet promptly.   Select a carpet pattern that does not visually hide the edge of steps.   Avoid placing throw rugs or scatter rugs at the top or bottom of stairways, or properly secure with carpet tape to prevent slippage.   Have an electrician put in a light switch at the top and bottom of the stairs.   Get light switches that glow.   Avoid the following practices: hurrying, inattention, obscured vision, carrying large loads, and wearing slip-on shoes.   Be aware of all pets.  KITCHEN  Place items that are used frequently, such as dishes and food, within easy reach.   Keep handles on pots and pans toward the center of the stove. Use back burners when possible.   Make sure spills are cleaned up quickly and allow time for drying.   Avoid walking on wet floors.   Avoid hot utensils and knives.   Position shelves so they are not too high or low.   Place commonly used objects within easy reach.   If necessary, use a sturdy step stool with a grab bar when reaching.   Make sure electrical cables are out of the way.   Do not use floor polish or wax that makes floors slippery.  OTHER HOME FALL PREVENTION STRATEGIES  Wear low heel or rubber sole shoes that are supportive and fit well.   Wear closed toe shoes.   Know and watch for side effects of medications. Have your caregiver or pharmacist look at all your medicines, even over-the-counter medicines. Some medicines can make you sleepy or dizzy.   Exercise regularly. Exercise makes you stronger and improves your balance and coordination.   Limit use of alcohol.   Use eyeglasses if necessary and keep them clean. Have your vision checked every year.   Organize your household in a manner that minimizes the need to walk distances when hurried, or go up and down stairs unnecessarily. For example, have a phone placed on at least each floor of your home. If possible, have a phone beside each sitting  or lying area where you spend the most time at home. Keep emergency numbers posted at all phones.   Use non-skid floor wax.   When using a ladder, make sure:   The base is firm.   All ladder feet are on level ground.   The ladder is angled against the wall properly.   When climbing a ladder, face the ladder and hold the ladder rungs firmly.   If reaching, always keep your hips and body weight centered between the rails.   When using a stepladder, make sure it is fully opened and both spreaders are firmly locked.   Do not climb a closed stepladder.   Avoid climbing beyond the second step from the top   of a stepladder and the 4th rung from the top of an extension ladder.   Learn and use mobility aids as needed.   Change positions slowly. Arise slowly from sitting and lying positions. Sit on the edge of your bed before getting to your feet.   If you have a history of falls, ask someone to add color or contrast paint or tape to grab bars and handrails in your home.   If you have a history of falls, ask someone to place contrasting color strips on first and last steps.   Install an electrical emergency response system if you need one, and know how to use it.   If you have a medical or other condition that causes you to have limited physical strength, it is important that you reach out to family and friends for occasional help.  FOR CHILDREN:  If young children are in the home, use safety gates. At the top of stairs use screw-mounted gates; use pressure-mounted gates for the bottom of the stairs and doorways between rooms.   Young children should be taught to descend stairs on their stomachs, feet first, and later using the handrail.   Keep drawers fully closed to prevent them from being climbed on or pulled out entirely.   Move chairs, cribs, beds and other furniture away from windows.   Consider installing window guards on windows ground floor and up, unless they are emergency  fire exits. Make sure they have easy release mechanisms.   Consider installing special locks that only allow the window to be opened to a certain height.   Never rely on window screens to prevent falls.   Never leave babies alone on changing tables, beds or sofas. Use a changing table that has a restraining strap.   When a child can pull to a standing position, the crib mattress should be adjusted to its lowest position. There should be at least 26 inches between the top rails of the crib drop side and the mattress. Toys, bumper pads, and other objects that can be used as steps to climb out should be removed from the crib.   On bunk beds never allow a child under age 6 to sleep on the top bunk. For older children, if the upper bunk is not against a wall, use guard rails on both sides. No matter how old a child is, keep the guard rails in place on the top bunk since children roll during sleep. Do not permit horseplay on bunks.   Grass and soil surfaces beneath backyard playground equipment should be replaced with hardwood chips, shredded wood mulch, sand, pea gravel, rubber, crushed stone, or another safer material at depths of at least 9 to 12 inches.   When riding bikes or using skates, skateboards, skis, or snowboards, require children to wear helmets. Look for those that have stickers stating that they meet or exceed safety standards.   Vertical posts or pickets in deck, balcony, and stairway railings should be no more than 3 1/2 inches apart if a young baby will have access to the area. The space between horizontal rails or bars, and between the floor and the first horizontal rail or bar, should be no more than 3 1/2 inches.  Document Released: 07/09/2002 Document Revised: 07/08/2011 Document Reviewed: 05/08/2009 [employer] Patient Information 2012 [employer]. 

## 2012-03-20 NOTE — Addendum Note (Signed)
Addended by: Wyline Beady on: 03/20/2012 01:26 PM   Modules accepted: Orders

## 2012-04-04 ENCOUNTER — Telehealth: Payer: Self-pay | Admitting: *Deleted

## 2012-04-04 NOTE — Telephone Encounter (Signed)
I called patient and told her what a coble stone throat was from you explanation Offered her an appointment today with Dr Harland German- refused she is not Md hopping Offered her appointment this week with you and she stated" you did not fix it when she was here so she is not coming in.

## 2012-04-04 NOTE — Telephone Encounter (Signed)
She is welcome to find care elsewhere.

## 2012-04-04 NOTE — Telephone Encounter (Signed)
Patient called c/o sore throat since last visit left message to call office back Wanted explanation of Coble stone throat

## 2012-08-02 HISTORY — PX: INSERT / REPLACE / REMOVE PACEMAKER: SUR710

## 2012-08-25 ENCOUNTER — Other Ambulatory Visit: Payer: Self-pay

## 2012-09-07 ENCOUNTER — Other Ambulatory Visit: Payer: Self-pay | Admitting: *Deleted

## 2012-09-07 DIAGNOSIS — R0600 Dyspnea, unspecified: Secondary | ICD-10-CM

## 2012-09-07 MED ORDER — AMBULATORY NON FORMULARY MEDICATION: Status: DC

## 2012-09-20 ENCOUNTER — Ambulatory Visit: Payer: Medicare Other | Admitting: Family Medicine

## 2012-09-21 ENCOUNTER — Encounter: Payer: Self-pay | Admitting: Family Medicine

## 2012-09-21 ENCOUNTER — Ambulatory Visit (INDEPENDENT_AMBULATORY_CARE_PROVIDER_SITE_OTHER): Payer: Medicare PPO | Admitting: Family Medicine

## 2012-09-21 VITALS — BP 141/77 | HR 91 | Ht 62.0 in | Wt 187.0 lb

## 2012-09-21 DIAGNOSIS — I129 Hypertensive chronic kidney disease with stage 1 through stage 4 chronic kidney disease, or unspecified chronic kidney disease: Secondary | ICD-10-CM

## 2012-09-21 DIAGNOSIS — I10 Essential (primary) hypertension: Secondary | ICD-10-CM

## 2012-09-21 DIAGNOSIS — N183 Chronic kidney disease, stage 3 unspecified: Secondary | ICD-10-CM

## 2012-09-21 DIAGNOSIS — E785 Hyperlipidemia, unspecified: Secondary | ICD-10-CM

## 2012-09-21 DIAGNOSIS — Z950 Presence of cardiac pacemaker: Secondary | ICD-10-CM

## 2012-09-21 HISTORY — DX: Chronic kidney disease, stage 3 unspecified: N18.30

## 2012-09-21 NOTE — Progress Notes (Signed)
  Subjective:    Patient ID: [name], female    DOB: Nov 09, 1945, 67 y.o.   MRN: [id number]  HPI She wanted to commit date. She's had a pacemaker since I last saw her. She had 2 syncopal events and went to [facility] and had a pacemaker placed. She's doing well so far. Infectious her followup this afternoon. She has a stress test scheduled for next week. The only adjustment her blood pressure medications was increasing her lisinopril HCT to twice a day. She still on her metoprolol and carvedilol. She says her blood pressure at one point was the most 200 systolic. His come down quite nicely since then. She's been on her current regimen for about 4 weeks at this point. She denies any chest pain or shortness of breath. She has not had any more syncopal event since getting her pacemaker.  Hyperlipidemia-tolerating her statin well without any side effects or problems.   Review of Systems     Objective:   Physical Exam  Constitutional: She is oriented to person, place, and time. She appears well-developed and well-nourished.  HENT:  Head: Normocephalic and atraumatic.  Neck: Neck supple. No thyromegaly present.  Cardiovascular: Normal rate, regular rhythm and normal heart sounds.   No carotid bruits  Pulmonary/Chest: Effort normal and breath sounds normal.  Musculoskeletal: She exhibits no edema.  Lymphadenopathy:    She has no cervical adenopathy.  Neurological: She is alert and oriented to person, place, and time.  Skin: Skin is warm and dry.  Psychiatric: She has a normal mood and affect. Her behavior is normal.          Assessment & Plan:  Hypertension-uncontrolled. Technically her systolic needs to be under 140 but it is significantly improved from 4 weeks ago and she feels she still adjusting to her new blood pressure regimen. She has a followup for her pacemaker this afternoon and has a stress test scheduled for next week. I would like to see her back about 4-6 weeks  after she sees the cardiologist to make sure that her blood pressures finally getting to goal and that she's doing well on her current regimen.  Hyperlipidemia-doing well on pravastatin. Continue current regimen.  Chronic kidney disease, stage III-we initially checked her creatinine in the fall. It was elevated as well as her BUN. Suggesting that she was mildly dehydrated. She says on average she drinks maybe 32 ounces a day which is not enough. I encouraged her increase her fluids to at least 50-60 ounces per day. I would like to recheck her BUN and creatinine today. Her current level quit stage her at level III.  Pacemaker-has followup this afternoon. Added to her problem list.

## 2012-10-04 LAB — BASIC METABOLIC PANEL WITH GFR
CO2: 27 mEq/L (ref 19–32)
Calcium: 9.8 mg/dL (ref 8.4–10.5)
Creat: 1.22 mg/dL — ABNORMAL HIGH (ref 0.50–1.10)
GFR, Est African American: 53 mL/min — ABNORMAL LOW

## 2012-11-15 ENCOUNTER — Other Ambulatory Visit: Payer: Self-pay | Admitting: *Deleted

## 2012-11-15 MED ORDER — PRAVASTATIN SODIUM 40 MG PO TABS: 40.0000 mg | ORAL_TABLET | Freq: Every day | ORAL | Status: DC

## 2013-03-14 ENCOUNTER — Ambulatory Visit (INDEPENDENT_AMBULATORY_CARE_PROVIDER_SITE_OTHER): Payer: Medicare PPO | Admitting: Physician Assistant

## 2013-03-14 ENCOUNTER — Encounter: Payer: Self-pay | Admitting: Physician Assistant

## 2013-03-14 VITALS — BP 181/91 | HR 99 | Wt 188.0 lb

## 2013-03-14 DIAGNOSIS — I10 Essential (primary) hypertension: Secondary | ICD-10-CM

## 2013-03-14 DIAGNOSIS — K449 Diaphragmatic hernia without obstruction or gangrene: Secondary | ICD-10-CM

## 2013-03-14 DIAGNOSIS — R51 Headache: Secondary | ICD-10-CM

## 2013-03-14 MED ORDER — GI COCKTAIL ~~LOC~~
30.0000 mL | Freq: Once | ORAL | Status: AC
  Administered 2013-03-14: 30 mL via ORAL

## 2013-03-14 MED ORDER — KETOROLAC TROMETHAMINE 30 MG/ML IJ SOLN
30.0000 mg | Freq: Once | INTRAMUSCULAR | Status: AC
  Administered 2013-03-14: 30 mg via INTRAMUSCULAR

## 2013-03-14 NOTE — Patient Instructions (Signed)
GI cocktail given today. Toradol. Start dexilant once a day.  Will get in with gastroenterologist.

## 2013-03-14 NOTE — Progress Notes (Signed)
  Subjective:    Patient ID: [name], female    DOB: 1946/01/12, 67 y.o.   MRN: [id number]  HPI Patient presents to the clinic with what she feels to be pain from her hiatial hernia. She has had these symptoms for 5 plus years but over the past couple of weeks has been much worse. She cannot eat anything that is not "slick and slimly". If she tries to eat anything solid the pain and pressure is tremendous and she vomits.  Pt reports that it has been hurting to swallow and chew. For last 3 days she vomits every time she tries to eat anything. Sometimes the pain feels sharp and stabbing and other times dull and a lot of pressure. She does feel like it is worse as night. Discomfort is making it hard to sleep. Never seen GI for evaluation. Denies any fever, chills. She does have upper abdominal pain. She does still have Gallbladder. She is not currently on a PPI.   Pt also has had a headache for 3 days. She has not had a headache like this in years. She thinks with all the pain it has thrown her body off.   Per patient when she is in pain or stressed BP elevates. She does not have cuff at home. Denies palpitations due to having a pacemaker. She denies any arm numbness or tingling.  Wednesday everytime worse when go to bed.  Hasn't slept.     Review of Systems     Objective:   Physical Exam  Constitutional: She is oriented to person, place, and time. She appears well-developed and well-nourished.  HENT:  Head: Normocephalic and atraumatic.  Cardiovascular: Normal rate, regular rhythm and normal heart sounds.   Pulmonary/Chest: Effort normal and breath sounds normal. She has no wheezes.  Abdominal: Bowel sounds are normal. She exhibits no mass. There is no guarding.  Tenderness over RUQ, LUQ and epigastric area.  Neurological: She is alert and oriented to person, place, and time.  Skin: Skin is warm and dry.  Psychiatric: She has a normal mood and affect. Her behavior is normal.           Assessment & Plan:  hiatal hernia- Not on PPI. Gave samples of dexilant to start today. Gave GI cocktail in office today to give some relief. Symptoms are consistent with hernia. I am still not 100 percent that it is not gallbladder inflammation/gastric ulcer;however, per pt has battled hernia for years. Mentioned getting a RUQ ultrasound to rule out inflammation/infection of gallbladder Pt did not want to spend any extra money. Pt felt confident of her symptoms. Will refer to GI ASAP for evaluation. I do think after confirmation of hernia surgery is a very good option. Discussed laparoscopic nature.   Headache- shot of Toradol should help. If continuing please call office. Did decrease dose to 30mg  due to renal insuffiencey. If HA does not improve we might consider BP causing HA. I would consider increasing toprol at that point.   HTN- Pt has a pacemaker. BP is always increased when she is in pain. Will recheck in 2 weeks.

## 2013-03-19 ENCOUNTER — Telehealth: Payer: Self-pay | Admitting: *Deleted

## 2013-03-19 NOTE — Telephone Encounter (Signed)
Pt called and informed of this form being completed and is up front for her to p/u .[name] [place]

## 2013-05-08 ENCOUNTER — Other Ambulatory Visit: Payer: Self-pay | Admitting: Family Medicine

## 2013-06-14 ENCOUNTER — Telehealth: Payer: Self-pay

## 2013-06-14 NOTE — Telephone Encounter (Signed)
Changed pharmacy

## 2013-07-10 ENCOUNTER — Other Ambulatory Visit: Payer: Self-pay | Admitting: Family Medicine

## 2013-08-14 ENCOUNTER — Other Ambulatory Visit: Payer: Self-pay | Admitting: Family Medicine

## 2013-09-11 ENCOUNTER — Other Ambulatory Visit: Payer: Self-pay | Admitting: Family Medicine

## 2013-10-17 ENCOUNTER — Other Ambulatory Visit: Payer: Self-pay | Admitting: Family Medicine

## 2013-10-17 NOTE — Telephone Encounter (Signed)
Spoke with patinet on 10/17/13 advised would fill but had to schedule appointment.  Sent pt to schedule appt for followup and labwork. Barry DienesKimberly Drew Herman, LPN

## 2013-11-13 ENCOUNTER — Other Ambulatory Visit: Payer: Self-pay | Admitting: Family Medicine

## 2013-11-14 ENCOUNTER — Ambulatory Visit (INDEPENDENT_AMBULATORY_CARE_PROVIDER_SITE_OTHER): Payer: Medicare PPO | Admitting: Family Medicine

## 2013-11-14 ENCOUNTER — Encounter: Payer: Self-pay | Admitting: Family Medicine

## 2013-11-14 VITALS — BP 163/85 | HR 79 | Ht 62.0 in | Wt 200.0 lb

## 2013-11-14 DIAGNOSIS — N183 Chronic kidney disease, stage 3 unspecified: Secondary | ICD-10-CM

## 2013-11-14 DIAGNOSIS — E348 Other specified endocrine disorders: Secondary | ICD-10-CM

## 2013-11-14 DIAGNOSIS — E785 Hyperlipidemia, unspecified: Secondary | ICD-10-CM

## 2013-11-14 DIAGNOSIS — I10 Essential (primary) hypertension: Secondary | ICD-10-CM

## 2013-11-14 LAB — COMPLETE METABOLIC PANEL WITH GFR
ALK PHOS: 30 U/L — AB (ref 39–117)
ALT: 18 U/L (ref 0–35)
AST: 17 U/L (ref 0–37)
Albumin: 4.1 g/dL (ref 3.5–5.2)
BILIRUBIN TOTAL: 0.3 mg/dL (ref 0.2–1.2)
BUN: 21 mg/dL (ref 6–23)
CO2: 26 mEq/L (ref 19–32)
Calcium: 9.4 mg/dL (ref 8.4–10.5)
Chloride: 107 mEq/L (ref 96–112)
Creat: 1.24 mg/dL — ABNORMAL HIGH (ref 0.50–1.10)
GFR, EST NON AFRICAN AMERICAN: 45 mL/min — AB
GFR, Est African American: 52 mL/min — ABNORMAL LOW
Glucose, Bld: 91 mg/dL (ref 70–99)
Potassium: 5.1 mEq/L (ref 3.5–5.3)
SODIUM: 141 meq/L (ref 135–145)
TOTAL PROTEIN: 6.2 g/dL (ref 6.0–8.3)

## 2013-11-14 LAB — LIPID PANEL
CHOL/HDL RATIO: 3.6 ratio
Cholesterol: 160 mg/dL (ref 0–200)
HDL: 45 mg/dL (ref >39)
LDL Cholesterol: 86 mg/dL (ref 0–99)
Triglycerides: 144 mg/dL (ref <150)
VLDL: 29 mg/dL (ref 0–40)

## 2013-11-14 LAB — HEMOGLOBIN A1C
HEMOGLOBIN A1C: 6 % — AB (ref <5.7)
Mean Plasma Glucose: 126 mg/dL — ABNORMAL HIGH (ref <117)

## 2013-11-14 MED ORDER — AMLODIPINE BESYLATE 5 MG PO TABS: 5.0000 mg | ORAL_TABLET | Freq: Every day | ORAL | Status: DC

## 2013-11-14 MED ORDER — LISINOPRIL-HYDROCHLOROTHIAZIDE 20-25 MG PO TABS: 1.0000 | ORAL_TABLET | Freq: Two times a day (BID) | ORAL | Status: DC

## 2013-11-14 NOTE — Patient Instructions (Signed)
Will add amlodipine to your regimen for blood pressure control. It's generic and it's taken once a day. See back in 3 months to make sure that we are getting her blood pressure under maximal control.

## 2013-11-14 NOTE — Progress Notes (Signed)
   Subjective:    Patient ID: [name], female    DOB: 10/01/1945, 68 y.o.   MRN: [id number]  HPI Hypertension- Pt denies chest pain, SOB, dizziness, or heart palpitations.  Taking meds as directed w/o problems.  Denies medication side effects.    IFG - completely asymptomatic.  Hyperlipidemia - tolerating statin well and no S.E.   CKD 3-last level was checked in August.   Lab Results  Component Value Date   CREATININE 1.22* 09/21/2012     Review of Systems     Objective:   Physical Exam  Constitutional: She is oriented to person, place, and time. She appears well-developed and well-nourished.  HENT:  Head: Normocephalic and atraumatic.  Cardiovascular: Normal rate, regular rhythm and normal heart sounds.   Pulmonary/Chest: Effort normal and breath sounds normal.  Neurological: She is alert and oriented to person, place, and time.  Skin: Skin is warm and dry.  Psychiatric: She has a normal mood and affect. Her behavior is normal.          Assessment & Plan:  HTN - uncontrolled blood pressure today. Was also uncontrolled the last time she was here in August. We'll adjust her regimen. Followup in 3 months to recheck pressure make sure she's tolerating her new regimen well.  IFG - will check hemoglobin A1c. Next  CKD 3-recheck kidney function. I definitely want to get her blood pressure under control as this can certainly contribute to damage to the kidneys.  Hyperlpidemia -continue statin therapy check lipid panel.  To declines bone density test and tetanus vaccine today.

## 2013-11-15 ENCOUNTER — Other Ambulatory Visit: Payer: Self-pay | Admitting: *Deleted

## 2013-11-15 MED ORDER — PRAVASTATIN SODIUM 40 MG PO TABS: 40.0000 mg | ORAL_TABLET | Freq: Every day | ORAL | Status: DC

## 2013-12-12 ENCOUNTER — Other Ambulatory Visit: Payer: Self-pay | Admitting: Family Medicine

## 2013-12-18 ENCOUNTER — Telehealth: Payer: Self-pay | Admitting: *Deleted

## 2013-12-28 NOTE — Telephone Encounter (Signed)
Error

## 2014-01-14 ENCOUNTER — Other Ambulatory Visit: Payer: Self-pay | Admitting: Family Medicine

## 2014-01-29 ENCOUNTER — Other Ambulatory Visit: Payer: Self-pay | Admitting: *Deleted

## 2014-01-29 MED ORDER — PRAVASTATIN SODIUM 40 MG PO TABS: ORAL_TABLET | ORAL | Status: DC

## 2014-03-08 ENCOUNTER — Ambulatory Visit: Payer: Medicare PPO | Admitting: Family Medicine

## 2014-03-12 ENCOUNTER — Other Ambulatory Visit: Payer: Self-pay | Admitting: Family Medicine

## 2014-04-10 ENCOUNTER — Other Ambulatory Visit: Payer: Self-pay | Admitting: Family Medicine

## 2014-05-14 ENCOUNTER — Other Ambulatory Visit: Payer: Self-pay | Admitting: Family Medicine

## 2014-05-22 ENCOUNTER — Ambulatory Visit: Payer: Medicare PPO

## 2014-05-23 ENCOUNTER — Ambulatory Visit (INDEPENDENT_AMBULATORY_CARE_PROVIDER_SITE_OTHER): Payer: Medicare PPO | Admitting: Family Medicine

## 2014-05-23 VITALS — Temp 97.1°F

## 2014-05-23 DIAGNOSIS — Z23 Encounter for immunization: Secondary | ICD-10-CM

## 2014-05-23 NOTE — Progress Notes (Signed)
   Subjective:    Patient ID: [name], female    DOB: 06/24/1946, 68 y.o.   MRN: [id number]  HPI   Pt is here for flu vaccine  Review of Systems     Objective:   Physical Exam        Assessment & Plan:

## 2014-06-12 ENCOUNTER — Encounter: Payer: Self-pay | Admitting: Family Medicine

## 2014-06-12 ENCOUNTER — Ambulatory Visit (INDEPENDENT_AMBULATORY_CARE_PROVIDER_SITE_OTHER): Payer: Medicare PPO | Admitting: Family Medicine

## 2014-06-12 VITALS — BP 119/66 | HR 83 | Wt 191.0 lb

## 2014-06-12 DIAGNOSIS — N183 Chronic kidney disease, stage 3 unspecified: Secondary | ICD-10-CM

## 2014-06-12 DIAGNOSIS — R7301 Impaired fasting glucose: Secondary | ICD-10-CM

## 2014-06-12 DIAGNOSIS — I10 Essential (primary) hypertension: Secondary | ICD-10-CM

## 2014-06-12 DIAGNOSIS — L219 Seborrheic dermatitis, unspecified: Secondary | ICD-10-CM | POA: Insufficient documentation

## 2014-06-12 DIAGNOSIS — E785 Hyperlipidemia, unspecified: Secondary | ICD-10-CM

## 2014-06-12 HISTORY — DX: Seborrheic dermatitis, unspecified: L21.9

## 2014-06-12 LAB — POCT GLYCOSYLATED HEMOGLOBIN (HGB A1C): Hemoglobin A1C: 6

## 2014-06-12 MED ORDER — CARVEDILOL 6.25 MG PO TABS: 6.2500 mg | ORAL_TABLET | Freq: Two times a day (BID) | ORAL | Status: DC

## 2014-06-12 MED ORDER — PRAVASTATIN SODIUM 40 MG PO TABS: ORAL_TABLET | ORAL | Status: DC

## 2014-06-12 MED ORDER — LISINOPRIL-HYDROCHLOROTHIAZIDE 20-25 MG PO TABS: ORAL_TABLET | ORAL | Status: DC

## 2014-06-12 MED ORDER — AMLODIPINE BESYLATE 5 MG PO TABS: ORAL_TABLET | ORAL | Status: DC

## 2014-06-12 MED ORDER — DESONIDE 0.05 % EX CREA: TOPICAL_CREAM | Freq: Every day | CUTANEOUS | Status: DC | PRN

## 2014-06-12 NOTE — Assessment & Plan Note (Signed)
Well-controlled. Continue current regimen. Due for BMP.

## 2014-06-12 NOTE — Assessment & Plan Note (Signed)
She feels that her status causing a lot of soreness in both of her shoulders. Is making it difficult to sleep at night because it's tender and sore. She denies any known injury or trauma. 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 We discussed that she can certainly hold her statin for a few weeks and see if she starts to feel better. If she does not, then recommend restart it. Or if she does feel better off of it, then recommend restart at half a tab. Sometimes a lower dose is better tolerated. She says she will try this and let me know.

## 2014-06-12 NOTE — Assessment & Plan Note (Addendum)
Well-controlled.her hemoglobin A-1 C is 6.0.  She is on a statin.

## 2014-06-12 NOTE — Assessment & Plan Note (Signed)
Will refill her topical steroid cream.

## 2014-06-12 NOTE — Progress Notes (Signed)
   Subjective:    Patient ID: [name], female    DOB: 11/30/1945, 68 y.o.   MRN: [id number]  HPI Hypertension- Pt denies chest pain, SOB, dizziness, or heart palpitations.  Taking meds as directed w/o problems.  Denies medication side effects.    IFG - no increased thirst or urination. We will repeat her hemoglobin A-1 C today. Last A-1 C was 6.0 months ago. .  CKD - last creatinine was 1.24 which was stable for her.  She has a rash on the sides of her nose that is red and would like a refill on desonide cream.    Hyperlipidemia-she thinks her statin is causing some shoulder discomfort and soreness. It's been going on for quite some time. She denies any knowledge from her injury and it is bilateral. She says it's making it difficult to sleep at night.  Review of Systems     Objective:   Physical Exam  Constitutional: She is oriented to person, place, and time. She appears well-developed and well-nourished.  HENT:  Head: Normocephalic and atraumatic.  Cardiovascular: Normal rate, regular rhythm and normal heart sounds.   Pulmonary/Chest: Effort normal and breath sounds normal.  Neurological: She is alert and oriented to person, place, and time.  Skin: Skin is warm and dry.  Psychiatric: She has a normal mood and affect. Her behavior is normal.          Assessment & Plan:

## 2014-06-12 NOTE — Assessment & Plan Note (Signed)
Due from BMP to recheck kidney function today.

## 2014-11-14 ENCOUNTER — Telehealth: Payer: Self-pay | Admitting: *Deleted

## 2014-11-14 ENCOUNTER — Encounter: Payer: Self-pay | Admitting: Family Medicine

## 2014-11-14 ENCOUNTER — Ambulatory Visit (INDEPENDENT_AMBULATORY_CARE_PROVIDER_SITE_OTHER): Payer: Medicare PPO | Admitting: Family Medicine

## 2014-11-14 VITALS — BP 112/59 | HR 66 | Ht 62.0 in | Wt 196.0 lb

## 2014-11-14 DIAGNOSIS — H8111 Benign paroxysmal vertigo, right ear: Secondary | ICD-10-CM

## 2014-11-14 DIAGNOSIS — R599 Enlarged lymph nodes, unspecified: Secondary | ICD-10-CM | POA: Diagnosis not present

## 2014-11-14 DIAGNOSIS — R590 Localized enlarged lymph nodes: Secondary | ICD-10-CM

## 2014-11-14 MED ORDER — MECLIZINE HCL 25 MG PO TABS: 25.0000 mg | ORAL_TABLET | Freq: Three times a day (TID) | ORAL | Status: DC | PRN

## 2014-11-14 NOTE — Telephone Encounter (Signed)
Pt left vm stating that you told her she had a toenail fungus but nothing was sent in.  I didn't see anything in your notes regarding this so I didn't know what to tell her.  Please advise.

## 2014-11-14 NOTE — Progress Notes (Signed)
   Subjective:    Patient ID: [name], female    DOB: 03/12/1946, 69 y.o.   MRN: [id number]  HPI Has episodes of vertigo. Started again about 2 weeks ago. No sinus sxs.  Says has had some left ear pain and  pressure.    Noticed a lump in her neck when showering. Was tender initially but not now.  No change in size. No fever. No recent URI. She never noticed this before. No recent trauma or injury.   Review of Systems     Objective:   Physical Exam  Constitutional: She is oriented to person, place, and time. She appears well-developed and well-nourished.  HENT:  Head: Normocephalic and atraumatic.    Right Ear: External ear normal.  Left Ear: External ear normal.  Nose: Nose normal.  Mouth/Throat: Oropharynx is clear and moist.  TMs and canals are clear. 2 mildly enlarged anterior cervical lymph nodes. Both on the right side. The lower one is just on the lower edge of the thyroid gland. They are each about a centimeter in size in soft but firm. No palpable irregularity of the thyroid gland itself.  Eyes: Conjunctivae and EOM are normal. Pupils are equal, round, and reactive to light.  Neck: Neck supple. No thyromegaly present.  Cardiovascular: Normal rate, regular rhythm and normal heart sounds.   Pulmonary/Chest: Effort normal and breath sounds normal. She has no wheezes.  Lymphadenopathy:    She has no cervical adenopathy.  Neurological: She is alert and oriented to person, place, and time.  Positive Dix-Hallpike maneuver to the right. It cause such significant nausea that she did not want to continue to test of the left side.  Skin: Skin is warm and dry.  Psychiatric: She has a normal mood and affect.          Assessment & Plan:  Swollen LN on right side of neck - based on exam I think that the area of concern just above the clavicle is a lymph node. I encouraged her to give it about 2 more weeks to see if it resolves or starts to decrease on its own. If it does not  then we'll get an ultrasound as well as blood work including a CBC, sedimentation rate, peripheral smear and possible chest x-ray.  Vertigo - based on exam and history symptoms are most consistent with benign positional vertigo. Handout provided on home exercises and discussed the importance of doing these. Perception sent for meclizine to take as needed. Call if not improving over the next 2-3 weeks.

## 2014-11-14 NOTE — Patient Instructions (Signed)
If you still have the swollen area in her neck in about 2 weeks and please let us know and we will get an ultrasound scheduled as well and sees some blood work.

## 2014-11-15 LAB — BASIC METABOLIC PANEL WITH GFR
BUN: 39 mg/dL — ABNORMAL HIGH (ref 6–23)
CO2: 24 mEq/L (ref 19–32)
CREATININE: 1.39 mg/dL — AB (ref 0.50–1.10)
Calcium: 9.8 mg/dL (ref 8.4–10.5)
Chloride: 107 mEq/L (ref 96–112)
GFR, Est African American: 45 mL/min — ABNORMAL LOW
GFR, Est Non African American: 39 mL/min — ABNORMAL LOW
Glucose, Bld: 93 mg/dL (ref 70–99)
Potassium: 5.3 mEq/L (ref 3.5–5.3)
SODIUM: 141 meq/L (ref 135–145)

## 2014-11-15 MED ORDER — TERBINAFINE HCL 250 MG PO TABS: 250.0000 mg | ORAL_TABLET | Freq: Every day | ORAL | Status: DC

## 2014-11-15 NOTE — Telephone Encounter (Signed)
Pt stopped by the office and told Victorino DikeJennifer that she wants whichever one works the fastest.

## 2014-11-15 NOTE — Telephone Encounter (Signed)
OK, script sent to pharmacy. Will need to check liver enzymes in one month.

## 2014-11-15 NOTE — Telephone Encounter (Signed)
Does she want to do an oral medication or a topical?

## 2014-11-15 NOTE — Telephone Encounter (Signed)
Tried calling pt. No answer and no vm.  

## 2014-12-10 ENCOUNTER — Other Ambulatory Visit: Payer: Self-pay | Admitting: Family Medicine

## 2014-12-11 ENCOUNTER — Other Ambulatory Visit: Payer: Self-pay | Admitting: *Deleted

## 2014-12-11 MED ORDER — AMLODIPINE BESYLATE 5 MG PO TABS: ORAL_TABLET | ORAL | Status: DC

## 2014-12-11 MED ORDER — LISINOPRIL-HYDROCHLOROTHIAZIDE 20-25 MG PO TABS: ORAL_TABLET | ORAL | Status: DC

## 2015-01-02 DIAGNOSIS — I498 Other specified cardiac arrhythmias: Secondary | ICD-10-CM

## 2015-01-02 HISTORY — DX: Other specified cardiac arrhythmias: I49.8

## 2015-01-07 ENCOUNTER — Other Ambulatory Visit: Payer: Self-pay | Admitting: Family Medicine

## 2015-03-05 ENCOUNTER — Telehealth: Payer: Self-pay | Admitting: *Deleted

## 2015-03-05 NOTE — Telephone Encounter (Signed)
Pt called and wanted to know if she could use IBU for back pain. She was advised that since she currently uses Celebrex she can take extra strength tylenol, and used ice and heat packs for relief. Pt voiced understanding and agreed.Loralee Pacas [place]

## 2015-06-04 ENCOUNTER — Ambulatory Visit (INDEPENDENT_AMBULATORY_CARE_PROVIDER_SITE_OTHER): Payer: Medicare PPO | Admitting: Family Medicine

## 2015-06-04 DIAGNOSIS — R232 Flushing: Secondary | ICD-10-CM

## 2015-06-04 DIAGNOSIS — Z23 Encounter for immunization: Secondary | ICD-10-CM

## 2015-06-04 DIAGNOSIS — N951 Menopausal and female climacteric states: Secondary | ICD-10-CM | POA: Diagnosis not present

## 2015-06-04 DIAGNOSIS — I10 Essential (primary) hypertension: Secondary | ICD-10-CM

## 2015-06-05 LAB — TSH: TSH: 0.749 u[IU]/mL (ref 0.350–4.500)

## 2015-06-05 LAB — COMPLETE METABOLIC PANEL WITH GFR
ALK PHOS: 32 U/L — AB (ref 33–130)
ALT: 12 U/L (ref 6–29)
AST: 13 U/L (ref 10–35)
Albumin: 4.2 g/dL (ref 3.6–5.1)
BUN: 36 mg/dL — AB (ref 7–25)
CALCIUM: 9.7 mg/dL (ref 8.6–10.4)
CO2: 26 mmol/L (ref 20–31)
CREATININE: 1.49 mg/dL — AB (ref 0.60–0.93)
Chloride: 104 mmol/L (ref 98–110)
GFR, Est African American: 41 mL/min — ABNORMAL LOW (ref >=60)
GFR, Est Non African American: 35 mL/min — ABNORMAL LOW (ref >=60)
Glucose, Bld: 90 mg/dL (ref 65–99)
Potassium: 5.2 mmol/L (ref 3.5–5.3)
Sodium: 140 mmol/L (ref 135–146)
Total Bilirubin: 0.2 mg/dL (ref 0.2–1.2)
Total Protein: 6.6 g/dL (ref 6.1–8.1)

## 2015-06-06 ENCOUNTER — Other Ambulatory Visit: Payer: Self-pay | Admitting: Family Medicine

## 2015-06-06 MED ORDER — LISINOPRIL-HYDROCHLOROTHIAZIDE 20-12.5 MG PO TABS: 1.0000 | ORAL_TABLET | Freq: Two times a day (BID) | ORAL | Status: DC

## 2015-06-10 ENCOUNTER — Other Ambulatory Visit: Payer: Self-pay | Admitting: *Deleted

## 2015-06-10 NOTE — Addendum Note (Signed)
Addended by: Deno EtienneBARKLEY, Keleigh Kazee L on: 06/10/2015 12:17 PM   Modules accepted: Orders

## 2015-06-11 ENCOUNTER — Other Ambulatory Visit: Payer: Self-pay | Admitting: Family Medicine

## 2015-06-24 ENCOUNTER — Ambulatory Visit (INDEPENDENT_AMBULATORY_CARE_PROVIDER_SITE_OTHER): Payer: Medicare PPO | Admitting: Family Medicine

## 2015-06-24 VITALS — BP 132/50 | HR 85 | Resp 16 | Wt 196.0 lb

## 2015-06-24 DIAGNOSIS — I10 Essential (primary) hypertension: Secondary | ICD-10-CM | POA: Diagnosis not present

## 2015-06-24 NOTE — Progress Notes (Signed)
   Subjective:    Patient ID: [name], female    DOB: 08/19/1945, 69 y.o.   MRN: [id number]  HPIPatient here for BP check. Taking medications as ordered. Denies all problems.    Review of Systems     Objective:   Physical Exam        Assessment & Plan:  HTN- patient is an actual office visit with M.D. We have not seen her for her blood pressure specifically in almost a year. She also needs to be seen for her impaired fasting glucose.  Nani Gasseratherine Metheney, MD

## 2015-07-09 DIAGNOSIS — Z45010 Encounter for checking and testing of cardiac pacemaker pulse generator [battery]: Secondary | ICD-10-CM | POA: Diagnosis not present

## 2015-07-09 DIAGNOSIS — I498 Other specified cardiac arrhythmias: Secondary | ICD-10-CM | POA: Diagnosis not present

## 2015-07-15 ENCOUNTER — Other Ambulatory Visit: Payer: Self-pay | Admitting: Family Medicine

## 2015-07-15 DIAGNOSIS — M1712 Unilateral primary osteoarthritis, left knee: Secondary | ICD-10-CM | POA: Diagnosis not present

## 2015-07-15 DIAGNOSIS — M1711 Unilateral primary osteoarthritis, right knee: Secondary | ICD-10-CM | POA: Diagnosis not present

## 2015-08-13 ENCOUNTER — Other Ambulatory Visit: Payer: Self-pay | Admitting: Family Medicine

## 2015-09-09 ENCOUNTER — Other Ambulatory Visit: Payer: Self-pay | Admitting: Family Medicine

## 2015-09-09 MED ORDER — LISINOPRIL-HYDROCHLOROTHIAZIDE 20-12.5 MG PO TABS: 1.0000 | ORAL_TABLET | Freq: Two times a day (BID) | ORAL | Status: DC

## 2015-09-10 ENCOUNTER — Other Ambulatory Visit: Payer: Self-pay | Admitting: Family Medicine

## 2015-09-10 MED ORDER — AMLODIPINE BESYLATE 5 MG PO TABS: 5.0000 mg | ORAL_TABLET | Freq: Every day | ORAL | Status: DC

## 2015-09-10 NOTE — Telephone Encounter (Signed)
Pt scheduled for appt next.

## 2015-09-10 NOTE — Telephone Encounter (Signed)
I called patient and informed her that she needs appointment for her HTN and Fasting glucose and she states she will call back and schedule

## 2015-09-18 ENCOUNTER — Ambulatory Visit: Payer: Medicare PPO | Admitting: Family Medicine

## 2015-09-30 ENCOUNTER — Encounter: Payer: Self-pay | Admitting: Family Medicine

## 2015-09-30 ENCOUNTER — Ambulatory Visit (INDEPENDENT_AMBULATORY_CARE_PROVIDER_SITE_OTHER): Payer: Medicare Other | Admitting: Family Medicine

## 2015-09-30 VITALS — BP 136/51 | HR 95 | Wt 191.0 lb

## 2015-09-30 DIAGNOSIS — N183 Chronic kidney disease, stage 3 unspecified: Secondary | ICD-10-CM

## 2015-09-30 DIAGNOSIS — R7301 Impaired fasting glucose: Secondary | ICD-10-CM

## 2015-09-30 DIAGNOSIS — I10 Essential (primary) hypertension: Secondary | ICD-10-CM

## 2015-09-30 DIAGNOSIS — Z1159 Encounter for screening for other viral diseases: Secondary | ICD-10-CM

## 2015-09-30 LAB — POCT GLYCOSYLATED HEMOGLOBIN (HGB A1C): Hemoglobin A1C: 6.1

## 2015-09-30 MED ORDER — LISINOPRIL-HYDROCHLOROTHIAZIDE 20-12.5 MG PO TABS: 1.0000 | ORAL_TABLET | Freq: Two times a day (BID) | ORAL | Status: DC

## 2015-09-30 MED ORDER — AMLODIPINE BESYLATE 5 MG PO TABS: 5.0000 mg | ORAL_TABLET | Freq: Every day | ORAL | Status: DC

## 2015-09-30 NOTE — Addendum Note (Signed)
Addended by: Deno Etienne on: 09/30/2015 04:25 PM   Modules accepted: Orders

## 2015-09-30 NOTE — Progress Notes (Signed)
   Subjective:    Patient ID: [name], female    DOB: Dec 09, 1945, 70 y.o.   MRN: [id number]  HPI Hypertension- Pt denies chest pain, SOB, dizziness, or heart palpitations.  Taking meds as directed w/o problems.  Denies medication side effects.    IFG - she has lost 5 lbs since last here.  No inc thirst or urination.   Seeing Dr. Elita Quick for her right knee. She had an injection earlier today and is a little bit sore. It's been hard for her to walk on it. She's been using her cane.  Review of Systems     Objective:   Physical Exam  Constitutional: She is oriented to person, place, and time. She appears well-developed and well-nourished.  HENT:  Head: Normocephalic and atraumatic.  Cardiovascular: Normal rate, regular rhythm and normal heart sounds.   No carotid bruits  Pulmonary/Chest: Effort normal and breath sounds normal.  Neurological: She is alert and oriented to person, place, and time.  Skin: Skin is warm and dry.  Psychiatric: She has a normal mood and affect. Her behavior is normal.          Assessment & Plan:  HTN- Well-controlled. Continue current regimen. Follow-up in 6 months. Due to repeat lab work. We have purposely decrease the diuretic component of her blood pressure pill because her kidney function had been mildly elevated with an elevated BUNs the last couple times with done blood work.  IFG - A1c of 6.1 today. Continue current regimen. Follow-up in 6 months.  CKD 3-due to recheck kidney function every 6 months. We need to monitor carefully said she is taking an NSAID.  He is okay with hepatitis C screening.  She declined mammogram.

## 2015-10-13 ENCOUNTER — Other Ambulatory Visit: Payer: Self-pay | Admitting: Family Medicine

## 2015-12-05 ENCOUNTER — Telehealth: Payer: Self-pay | Admitting: *Deleted

## 2015-12-05 NOTE — Telephone Encounter (Signed)
Pt called and lvm stating that her L ear is stopped up and would like some advice.[name], [name]

## 2015-12-05 NOTE — Telephone Encounter (Signed)
I called pt back and there is no vm set up.Samantha PacasBarkley, [name] [name]

## 2015-12-09 ENCOUNTER — Encounter: Payer: Self-pay | Admitting: Physician Assistant

## 2015-12-09 ENCOUNTER — Ambulatory Visit (INDEPENDENT_AMBULATORY_CARE_PROVIDER_SITE_OTHER): Payer: Medicare Other | Admitting: Physician Assistant

## 2015-12-09 VITALS — BP 144/59 | HR 85

## 2015-12-09 DIAGNOSIS — M898X1 Other specified disorders of bone, shoulder: Secondary | ICD-10-CM

## 2015-12-09 DIAGNOSIS — I10 Essential (primary) hypertension: Secondary | ICD-10-CM

## 2015-12-09 DIAGNOSIS — M25511 Pain in right shoulder: Secondary | ICD-10-CM | POA: Diagnosis not present

## 2015-12-09 DIAGNOSIS — H9192 Unspecified hearing loss, left ear: Secondary | ICD-10-CM | POA: Diagnosis not present

## 2015-12-09 DIAGNOSIS — H6122 Impacted cerumen, left ear: Secondary | ICD-10-CM | POA: Diagnosis not present

## 2015-12-09 MED ORDER — AMLODIPINE BESYLATE 5 MG PO TABS: 5.0000 mg | ORAL_TABLET | Freq: Every day | ORAL | Status: DC

## 2015-12-09 NOTE — Progress Notes (Addendum)
   Subjective:    Patient ID: [name], female    DOB: 06/30/1946, 70 y.o.   MRN: [id number]  HPI Patient is complaining of decreased hearing in her left ear x10 days. She states the hearing loss came on suddenly after she got out of the shower. At first, she felt like there was water in the ear but that has resolved. Now, she states that is feels "stopped up" and "needs to pop but won't." She states she cannot hear anything out of that ear. She took Sudafed for 1 week with no relief. Denies ear pain. She does report some itching. Denies drainage from ear. Ear is not tender to touch. Denies ringing in ear.   Patient would also like refill of HTN medication. Denies chest pain, palpitations, shortness of breath, swelling in extremities.  Denies sinus congestion/pressure, fever, cough.    Review of Systems  All other systems reviewed and are negative.      Objective:   Physical Exam  Constitutional: She is oriented to person, place, and time. She appears well-developed and well-nourished.  HENT:  Head: Normocephalic and atraumatic.  Right Ear: Tympanic membrane, external ear and ear canal normal.  Unable to visualize left TM due to cerumen impaction.   Eyes: Conjunctivae and EOM are normal. Right eye exhibits no discharge. Left eye exhibits no discharge.  Cardiovascular: Normal rate, regular rhythm and normal heart sounds.   Pulmonary/Chest: Effort normal and breath sounds normal. No respiratory distress. She has no wheezes. She has no rales. She exhibits no tenderness.  Musculoskeletal:       Arms: Lymphadenopathy:    She has no cervical adenopathy.  Neurological: She is alert and oriented to person, place, and time.  Skin: Skin is warm and dry.  Psychiatric: She has a normal mood and affect. Her behavior is normal. Thought content normal.          Assessment & Plan:  1. Hearing Loss/Cerumen impaction of left ear- Patient presents with a 10 day history of hearing loss in  her left. On PE, there was no tenderness to palpation of the ear or surrounding area. I was unable to visualize left TM due to cerumen impaction. Once ear was irrigated, TM was visualized clearly, patient's symptoms completely resolved, and hearing returned to normal.   ..Indication: Cerumen impaction of the ear(s)  Medical necessity statement: On physical examination, cerumen impairs clinically significant portions of the external auditory canal, and tympanic membrane. Noted obstructive, copious cerumen that cannot be removed without magnification and instrumentations requiring physician skills Consent: Discussed benefits and risks of procedure and verbal consent obtained Procedure: Patient was prepped for the procedure. Utilized an otoscope to assess and take note of the ear canal, the tympanic membrane, and the presence, amount, and placement of the cerumen. Gentle water irrigation and soft plastic curette was utilized to remove cerumen.  Post procedure examination: shows cerumen was completely removed. Patient tolerated procedure well. The patient is made aware that they may experience temporary vertigo, temporary hearing loss, and temporary discomfort. If these symptom last for more than 24 hours to call the clinic or proceed to the ED.  2. Hypertension- Denies chest pain, palpitations, shortness of breath, swelling in extremities. Refilled Norvasc. Follow up in 6 months.   3. Right clavicle Pain- On PE, patient was tender to palpation of the right clavicle. Will obtain Xray to determine etiology.

## 2016-01-13 ENCOUNTER — Other Ambulatory Visit: Payer: Self-pay | Admitting: Family Medicine

## 2016-03-19 ENCOUNTER — Encounter: Payer: Medicare Other | Admitting: Family Medicine

## 2016-04-05 ENCOUNTER — Other Ambulatory Visit: Payer: Self-pay | Admitting: Family Medicine

## 2016-04-23 ENCOUNTER — Encounter: Payer: Medicare Other | Admitting: Family Medicine

## 2016-05-14 ENCOUNTER — Encounter: Payer: Medicare Other | Admitting: Family Medicine

## 2016-06-07 ENCOUNTER — Other Ambulatory Visit: Payer: Self-pay | Admitting: Family Medicine

## 2016-06-07 MED ORDER — DESONIDE 0.05 % EX CREA: TOPICAL_CREAM | CUTANEOUS | 0 refills | Status: DC

## 2016-06-07 NOTE — Telephone Encounter (Signed)
Pt advised a follow up was needed, she will contact clinic to schedule.

## 2016-06-07 NOTE — Telephone Encounter (Signed)
OK to fill but please call patient and remind her to schedule her follow-up for her blood pressure this month.

## 2016-06-09 ENCOUNTER — Other Ambulatory Visit: Payer: Self-pay | Admitting: Physician Assistant

## 2016-09-13 ENCOUNTER — Other Ambulatory Visit: Payer: Self-pay | Admitting: Family Medicine

## 2016-10-27 ENCOUNTER — Telehealth: Payer: Self-pay | Admitting: *Deleted

## 2016-12-08 ENCOUNTER — Other Ambulatory Visit: Payer: Self-pay | Admitting: Family Medicine

## 2016-12-08 NOTE — Telephone Encounter (Signed)
[name]  

## 2016-12-13 ENCOUNTER — Encounter: Payer: Self-pay | Admitting: Family Medicine

## 2016-12-13 ENCOUNTER — Other Ambulatory Visit: Payer: Self-pay

## 2016-12-13 ENCOUNTER — Ambulatory Visit (INDEPENDENT_AMBULATORY_CARE_PROVIDER_SITE_OTHER): Payer: Medicare Other | Admitting: Family Medicine

## 2016-12-13 VITALS — BP 142/66 | HR 89 | Ht 62.0 in | Wt 191.0 lb

## 2016-12-13 DIAGNOSIS — N183 Chronic kidney disease, stage 3 unspecified: Secondary | ICD-10-CM

## 2016-12-13 DIAGNOSIS — I10 Essential (primary) hypertension: Secondary | ICD-10-CM

## 2016-12-13 DIAGNOSIS — R7301 Impaired fasting glucose: Secondary | ICD-10-CM | POA: Diagnosis not present

## 2016-12-13 LAB — POCT GLYCOSYLATED HEMOGLOBIN (HGB A1C): Hemoglobin A1C: 5.9

## 2016-12-13 MED ORDER — LISINOPRIL-HYDROCHLOROTHIAZIDE 20-12.5 MG PO TABS: 1.0000 | ORAL_TABLET | Freq: Two times a day (BID) | ORAL | 1 refills | Status: DC

## 2016-12-13 MED ORDER — CARVEDILOL 6.25 MG PO TABS: 6.2500 mg | ORAL_TABLET | Freq: Two times a day (BID) | ORAL | 3 refills | Status: DC

## 2016-12-13 MED ORDER — AMLODIPINE BESYLATE 5 MG PO TABS: 5.0000 mg | ORAL_TABLET | Freq: Every day | ORAL | 1 refills | Status: DC

## 2016-12-13 NOTE — Progress Notes (Signed)
Subjective:    CC:   HPI: Hypertension- Pt denies chest pain, SOB, dizziness, or heart palpitations.  Taking meds as directed w/o problems.  Denies medication side effects.    Impaired fasting glucose-no increased thirst or urination. No symptoms consistent with hypoglycemia. Lab Results  Component Value Date   HGBA1C 6.1 09/30/2015   Chronic kidney disease stage III-no recent changes. Last creatinine was elevated at 1.4 in November 2016. She's overdue for labs.   Past medical history, Surgical history, Family history not pertinant except as noted below, Social history, Allergies, and medications have been entered into the medical record, reviewed, and corrections made.   Review of Systems: No fevers, chills, night sweats, weight loss, chest pain, or shortness of breath.   Objective:    General: Well Developed, well nourished, and in no acute distress.  Neuro: Alert and oriented x3, extra-ocular muscles intact, sensation grossly intact.  HEENT: Normocephalic, atraumatic  Skin: Warm and dry, no rashes. Cardiac: Regular rate and rhythm, no murmurs rubs or gallops, no lower extremity edema.  Respiratory: Clear to auscultation bilaterally. Not using accessory muscles, speaking in full sentences.   Impression and Recommendations:    HTN - Well controlled. Continue current regimen. Follow-up in 6 months. Due for CMP and fasting lipid panel. Lab slip provided.  IFG - Stable. 11 A1c of 5.9 today. Continue to monitor diet and exercise and recheck in 6-12 months.  CKD_ Due to recheck kidney function. Reminded her that we need to do this every 6 months.  Patient once again declined pneumonia vaccine. She is also not interested in a mammogram or colon cancer screening.

## 2016-12-13 NOTE — Progress Notes (Signed)
Per Pt request, Rx's sent to local pharmacy.

## 2017-01-13 ENCOUNTER — Telehealth: Payer: Self-pay | Admitting: Family Medicine

## 2017-01-13 NOTE — Telephone Encounter (Signed)
Pharmacy updated.Loralee PacasBarkley, Delores Edelstein [place]

## 2017-01-13 NOTE — Telephone Encounter (Signed)
Patient called [facility] that [facility] [address] [place]. Thanks

## 2017-03-03 ENCOUNTER — Telehealth: Payer: Self-pay | Admitting: *Deleted

## 2017-03-04 NOTE — Telephone Encounter (Signed)
closed

## 2017-05-26 NOTE — Pre-Procedure Instructions (Signed)
[name]  05/26/2017      [phone] - [place], [place] - [address] [facility] [address] [place] [id number] Phone: [phone] Fax: [phone]    Your procedure is scheduled on November 5  [facility]  Call this number if you have problems the morning of surgery:  [phone]   Remember:  Do not eat food or drink liquids after midnight.  Continue all other medications as directed by your physician except follow these medication instructions before surgery   Take these medicines the morning of surgery with A SIP OF WATER amLODipine (NORVASC) cephALEXin (KEFLEX) HYDROcodone-acetaminophen (NORCO)   7 days prior to surgery STOP taking any Aspirin (unless otherwise instructed by your surgeon), Aleve, Naproxen, Ibuprofen, Motrin, Advil, Goody's, BC's, all herbal medications, fish oil, and all vitamins, celecoxib (CELEBREX)    Do not wear jewelry, make-up or nail polish.  Do not wear lotions, powders, or perfumes, or deoderant.  Do not shave 48 hours prior to surgery.  Men may shave face and neck.  Do not bring valuables to the hospital.  [facility] is not responsible for any belongings or valuables.  Contacts, dentures or bridgework may not be worn into surgery.  Leave your suitcase in the car.  After surgery it may be brought to your room.  For patients admitted to the hospital, discharge time will be determined by your treatment team.  Patients discharged the day of surgery will not be allowed to drive home.    Special instructions:   [place]- Preparing For Surgery  Before surgery, you can play an important role. Because skin is not sterile, your skin needs to be as free of germs as possible. You can reduce the number of germs on your skin by washing with CHG (chlorahexidine gluconate) Soap before surgery.  CHG is an antiseptic cleaner which kills germs and  bonds with the skin to continue killing germs even after washing.  Please do not use if you have an allergy to CHG or antibacterial soaps. If your skin becomes reddened/irritated stop using the CHG.  Do not shave (including legs and underarms) for at least 48 hours prior to first CHG shower. It is OK to shave your face.  Please follow these instructions carefully.   1. Shower the NIGHT BEFORE SURGERY and the MORNING OF SURGERY with CHG.   2. If you chose to wash your hair, wash your hair first as usual with your normal shampoo.  3. After you shampoo, rinse your hair and body thoroughly to remove the shampoo.  4. Use CHG as you would any other liquid soap. You can apply CHG directly to the skin and wash gently with a scrungie or a clean washcloth.   5. Apply the CHG Soap to your body ONLY FROM THE NECK DOWN.  Do not use on open wounds or open sores. Avoid contact with your eyes, ears, mouth and genitals (private parts). Wash Face and genitals (private parts)  with your normal soap.  6. Wash thoroughly, paying special attention to the area where your surgery will be performed.  7. Thoroughly rinse your body with warm water from the neck down.  8. DO NOT shower/wash with your normal soap after using and rinsing off the CHG Soap.  9. Pat yourself dry with a CLEAN TOWEL.  10. Wear CLEAN PAJAMAS to bed the night  before surgery, wear comfortable clothes the morning of surgery  11. Place CLEAN SHEETS on your bed the night of your first shower and DO NOT SLEEP WITH PETS.    Day of Surgery: Do not apply any deodorants/lotions. Please wear clean clothes to the hospital/surgery center.      Please read over the following fact sheets that you were given.

## 2017-05-27 ENCOUNTER — Encounter (HOSPITAL_COMMUNITY)
Admission: RE | Admit: 2017-05-27 | Discharge: 2017-05-27 | Disposition: A | Discharge status: Home / Self Care | Payer: Medicare Other | Source: Ambulatory Visit | Attending: Orthopedic Surgery | Admitting: Orthopedic Surgery

## 2017-05-27 ENCOUNTER — Ambulatory Visit (HOSPITAL_COMMUNITY)
Admission: RE | Admit: 2017-05-27 | Discharge: 2017-05-27 | Disposition: A | Discharge status: Home / Self Care | Payer: Medicare Other | Source: Ambulatory Visit | Attending: Orthopedic Surgery | Admitting: Orthopedic Surgery

## 2017-05-27 ENCOUNTER — Encounter (HOSPITAL_COMMUNITY): Payer: Self-pay

## 2017-05-27 DIAGNOSIS — K219 Gastro-esophageal reflux disease without esophagitis: Secondary | ICD-10-CM | POA: Diagnosis not present

## 2017-05-27 DIAGNOSIS — M25552 Pain in left hip: Secondary | ICD-10-CM | POA: Diagnosis not present

## 2017-05-27 DIAGNOSIS — Z7901 Long term (current) use of anticoagulants: Secondary | ICD-10-CM | POA: Diagnosis not present

## 2017-05-27 DIAGNOSIS — Z87442 Personal history of urinary calculi: Secondary | ICD-10-CM | POA: Insufficient documentation

## 2017-05-27 DIAGNOSIS — Z01818 Encounter for other preprocedural examination: Secondary | ICD-10-CM | POA: Insufficient documentation

## 2017-05-27 DIAGNOSIS — I129 Hypertensive chronic kidney disease with stage 1 through stage 4 chronic kidney disease, or unspecified chronic kidney disease: Secondary | ICD-10-CM | POA: Diagnosis not present

## 2017-05-27 DIAGNOSIS — Z96652 Presence of left artificial knee joint: Secondary | ICD-10-CM | POA: Diagnosis not present

## 2017-05-27 DIAGNOSIS — Z01810 Encounter for preprocedural cardiovascular examination: Secondary | ICD-10-CM | POA: Diagnosis present

## 2017-05-27 DIAGNOSIS — E785 Hyperlipidemia, unspecified: Secondary | ICD-10-CM | POA: Diagnosis not present

## 2017-05-27 DIAGNOSIS — Z79899 Other long term (current) drug therapy: Secondary | ICD-10-CM | POA: Diagnosis not present

## 2017-05-27 DIAGNOSIS — Z950 Presence of cardiac pacemaker: Secondary | ICD-10-CM | POA: Diagnosis not present

## 2017-05-27 DIAGNOSIS — Z01812 Encounter for preprocedural laboratory examination: Secondary | ICD-10-CM | POA: Insufficient documentation

## 2017-05-27 DIAGNOSIS — R9431 Abnormal electrocardiogram [ECG] [EKG]: Secondary | ICD-10-CM | POA: Diagnosis not present

## 2017-05-27 DIAGNOSIS — Z6834 Body mass index (BMI) 34.0-34.9, adult: Secondary | ICD-10-CM | POA: Diagnosis not present

## 2017-05-27 DIAGNOSIS — E669 Obesity, unspecified: Secondary | ICD-10-CM | POA: Diagnosis not present

## 2017-05-27 DIAGNOSIS — Z79891 Long term (current) use of opiate analgesic: Secondary | ICD-10-CM | POA: Insufficient documentation

## 2017-05-27 DIAGNOSIS — N183 Chronic kidney disease, stage 3 (moderate): Secondary | ICD-10-CM | POA: Insufficient documentation

## 2017-05-27 HISTORY — DX: Hyperlipidemia, unspecified: E78.5

## 2017-05-27 HISTORY — DX: Unspecified osteoarthritis, unspecified site: M19.90

## 2017-05-27 HISTORY — DX: Gastro-esophageal reflux disease without esophagitis: K21.9

## 2017-05-27 HISTORY — DX: Cardiac arrhythmia, unspecified: I49.9

## 2017-05-27 HISTORY — DX: Personal history of urinary calculi: Z87.442

## 2017-05-27 HISTORY — DX: Chronic kidney disease, stage 3 (moderate): N18.3

## 2017-05-27 HISTORY — DX: Other complications of anesthesia, initial encounter: T88.59XA

## 2017-05-27 HISTORY — DX: Seborrheic dermatitis, unspecified: L21.9

## 2017-05-27 HISTORY — DX: Diaphragmatic hernia without obstruction or gangrene: K44.9

## 2017-05-27 HISTORY — DX: Adverse effect of unspecified anesthetic, initial encounter: T41.45XA

## 2017-05-27 HISTORY — DX: Presence of cardiac pacemaker: Z95.0

## 2017-05-27 HISTORY — DX: Impaired fasting glucose: R73.01

## 2017-05-27 HISTORY — DX: Essential (primary) hypertension: I10

## 2017-05-27 LAB — CBC WITH DIFFERENTIAL/PLATELET
BASOS ABS: 0 10*3/uL (ref 0.0–0.1)
BASOS PCT: 0 %
EOS ABS: 0.4 10*3/uL (ref 0.0–0.7)
EOS PCT: 3 %
HCT: 38.7 % (ref 36.0–46.0)
Hemoglobin: 13 g/dL (ref 12.0–15.0)
LYMPHS PCT: 29 %
Lymphs Abs: 3 10*3/uL (ref 0.7–4.0)
MCH: 30.8 pg (ref 26.0–34.0)
MCHC: 33.6 g/dL (ref 30.0–36.0)
MCV: 91.7 fL (ref 78.0–100.0)
Monocytes Absolute: 0.7 10*3/uL (ref 0.1–1.0)
Monocytes Relative: 7 %
NEUTROS ABS: 6.3 10*3/uL (ref 1.7–7.7)
Neutrophils Relative %: 61 %
PLATELETS: 312 10*3/uL (ref 150–400)
RBC: 4.22 MIL/uL (ref 3.87–5.11)
RDW: 13.5 % (ref 11.5–15.5)
WBC: 10.3 10*3/uL (ref 4.0–10.5)

## 2017-05-27 LAB — URINALYSIS, ROUTINE W REFLEX MICROSCOPIC
Bilirubin Urine: NEGATIVE
GLUCOSE, UA: NEGATIVE mg/dL
Hgb urine dipstick: NEGATIVE
Ketones, ur: NEGATIVE mg/dL
LEUKOCYTES UA: NEGATIVE
Nitrite: NEGATIVE
Protein, ur: NEGATIVE mg/dL
SPECIFIC GRAVITY, URINE: 1.016 (ref 1.005–1.030)
pH: 5 (ref 5.0–8.0)

## 2017-05-27 LAB — TYPE AND SCREEN
ABO/RH(D): O POS
Antibody Screen: NEGATIVE

## 2017-05-27 LAB — BASIC METABOLIC PANEL
ANION GAP: 10 (ref 5–15)
BUN: 31 mg/dL — ABNORMAL HIGH (ref 6–20)
CHLORIDE: 106 mmol/L (ref 101–111)
CO2: 22 mmol/L (ref 22–32)
Calcium: 9.7 mg/dL (ref 8.9–10.3)
Creatinine, Ser: 1.39 mg/dL — ABNORMAL HIGH (ref 0.44–1.00)
GFR calc Af Amer: 43 mL/min — ABNORMAL LOW (ref >60)
GFR calc non Af Amer: 37 mL/min — ABNORMAL LOW (ref >60)
GLUCOSE: 97 mg/dL (ref 65–99)
POTASSIUM: 4.7 mmol/L (ref 3.5–5.1)
Sodium: 138 mmol/L (ref 135–145)

## 2017-05-27 LAB — APTT: aPTT: 30 seconds (ref 24–36)

## 2017-05-27 LAB — PROTIME-INR
INR: 0.99
Prothrombin Time: 13 seconds (ref 11.4–15.2)

## 2017-05-27 LAB — SURGICAL PCR SCREEN
MRSA, PCR: NEGATIVE
Staphylococcus aureus: NEGATIVE

## 2017-05-27 NOTE — Progress Notes (Signed)
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  Has not seen a cardiologist in 2 years.  Samantha CreeKathy Blume  PA for Dr. Turner Danielsowan notified. Samantha MessierKathy made appointment for pt. To be seen by [name] Tuesday October 30 @ 10:20 AM [facility] [place].  Information given to patient and her daughter.  Implanted Device orders given to daughter to take to Dr. Dulce [name] and ask them to fax them back to us.

## 2017-05-30 DIAGNOSIS — Z01810 Encounter for preprocedural cardiovascular examination: Secondary | ICD-10-CM | POA: Insufficient documentation

## 2017-05-30 NOTE — Progress Notes (Signed)
Cardiology Office Note:    Date:  06/01/2017   ID:  [name], DOB 08-24-1945, MRN [id number]  PCP:  Agapito Games, MD  Cardiologist:  Norman Herrlich, MD   Referring MD: Agapito Games, *  ASSESSMENT:    1. Pacemaker   2. Preoperative cardiovascular examination   3. 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  The cardiac status is stable. The planned procedure is intermediate risk. The functional capacity is 4 mets or greater  Unable to be determined with severe functional limitation Recent cardiac tests performed EKG today, normal pacemaker function Given the above his overall risk for the planned procedure is low/acceptable if her pacemaker interrogation today is stable Antiplatelet/ anticoagulant recommendation: none Other cardiac medication or device recommendation: use a smart magnet intraoperatively Anesthesia recommendation: none Observation, monitoring,and postoperative test recommendation: telemetry bed for 24 hrs afyter surgery and EKG PO day 1 The patient is optimized from a cardiology perspective: Yes   Her pacemaker interrogation is normal, stable thresholds and pacemaker depnedent.  She will need a smart magnet intraoperatively. She is fully optimized, proceed with her planned surgery.  In order of problems listed above:  1. Stable her device will be interrogated today in the office.  Smart Magnant is appropriate intraoperatively. 2. Stable blood pressure continue current treatment renal function is normal at this time.  Next appointment she will transition to pacemaker care in my practice and follow-up with EP in 6 months   Medication Adjustments/Labs and Tests Ordered: Current medicines are reviewed at length with the patient today.  Concerns regarding  medicines are outlined above.  No orders of the defined types were placed in this encounter.  No orders of the defined types were placed in this encounter.    Chief Complaint  Patient presents with  . Pre-op Exam    pacemaker last evaluated March 2017    History of Present Illness:    [name] is a 71 y.o. female who is being seen today for the evaluation of her pacemaker inserted 2014 for bradycardia prior to THA  at the request of Dr [name].  Her physician was Dr. Chales Abrahams 1 of my previous partners and she was so disturbed when he left the practice that she has not had cardiology care.  She is not pacemaker dependent she has had no palpitation syncope or exercise intolerance and has no history of congenital coronary or valvular heart disease or congestive heart failure.  No complaints of chest pain shortness of breath palpitation syncope TIA or pacemaker awareness.  She is severely limited with hip pain and is scheduled for THA 06/06/2017.  EKG shows normal pacemaker function her device will be interrogated today. Adapta Pacemaker-08/14/2012 - Implanted  Inventory item:Model/Cat no.:  Serial no.:Manufacturer: MEDTRONIC CARDIOVASCULAR    Past Medical History:  Diagnosis Date  . Arthritis   . CKD (chronic kidney disease) stage 3, GFR 30-59 ml/min (HCC) 09/21/2012  . Complication of anesthesia    slow to wake up  . Dysrhythmia   . GERD 10/04/2007   Qualifier: Diagnosis of  By: Linford Arnold MD, Santina Evans    . HIATAL HERNIA WITH REFLUX 10/04/2007   Qualifier: Diagnosis of  By: Linford Arnold MD, Santina Evans    . History of kidney stones   . Hyperlipidemia 08/10/2010   Qualifier: Diagnosis of  ByLinford Arnold MD, Santina Evans    . Hypertension   . HYPERTENSION, BENIGN ESSENTIAL 04/27/2007   Qualifier: Diagnosis of  By: Linford Arnold MD, Santina Evans    . Impaired fasting glucose 08/10/2010   Qualifier: Diagnosis of  By: Linford Arnold MD, Santina Evans    . Pacemaker 09/21/2012  . Pneumonia    hx. pneumonia  .  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 Laurier Nancy, [facility]   . Seborrheic dermatitis 06/12/2014    Past Surgical History:  Procedure Laterality Date  . ABDOMINAL HYSTERECTOMY    . BACK SURGERY     X 2  . bladder tack    . CARPAL TUNNEL RELEASE  2012   RT  . INSERT / REPLACE / REMOVE PACEMAKER    . LT knee replacement  2006   new hardware 2007 due to staph infection, on chronic keflex  . PACEMAKER INSERTION    . TONSILLECTOMY    . TRANSTHORACIC ECHOCARDIOGRAM     08/14/12 [facility]): NL LVF, no segmental abnormality, borderline LVH, mild MR, mild MR, trace TR    Current Medications: Current Meds  Medication Sig  . amLODipine (NORVASC) 5 MG tablet Take 1 tablet (5 mg total) by mouth daily.  . celecoxib (CELEBREX) 200 MG capsule Take 200 mg by mouth 2 (two) times daily.   . cephALEXin (KEFLEX) 500 MG capsule Take 500 mg by mouth 2 (two) times daily.   Marland Kitchen HYDROcodone-acetaminophen (NORCO) 7.5-325 MG tablet Take 1 tablet by mouth 2 (two) times daily.   Marland Kitchen lisinopril-hydrochlorothiazide (PRINZIDE,ZESTORETIC) 20-12.5 MG tablet Take 1 tablet by mouth 2 (two) times daily.     Allergies:   Patient has no known allergies.   Social History   Social History  . Marital status: Widowed    Spouse name: N/A  . Number of children: N/A  . Years of education: N/A   Social History Main Topics  . Smoking status: Never Smoker  . Smokeless tobacco: Never Used  . Alcohol use No  . Drug use: No  . Sexual activity: Not Asked   Other Topics Concern  . None   Social History Narrative  . None     Family History: The patient's family history includes Diabetes (age of onset: 67) in her mother; Heart attack in her mother; Hypertension in her father and mother.  ROS:   Review of Systems  Constitution: Negative.  HENT: Negative.   Eyes: Negative.   Cardiovascular: Negative.     Respiratory: Negative.   Endocrine: Negative.   Hematologic/Lymphatic: Negative.   Skin: Negative.   Musculoskeletal: Positive for joint pain.  Gastrointestinal: Negative.   Genitourinary: Negative.   Neurological: Negative.   Psychiatric/Behavioral: Negative.   Allergic/Immunologic: Negative.    Please see the history of present illness.     EKGs/Labs/Other Studies Reviewed:    The following studies were reviewed today:   EKG:  EKG is  ordered today.  The ekg ordered today demonstrates AV paced rhythm  Recent Labs: 05/27/2017: BUN 31; Creatinine, Ser 1.39; Hemoglobin 13.0; Platelets 312; Potassium 4.7; Sodium 138  Recent Lipid Panel    Component Value Date/Time   CHOL 160 11/14/2013 1121   TRIG 144 11/14/2013 1121   HDL 45 11/14/2013 1121   CHOLHDL 3.6 11/14/2013 1121   VLDL 29 11/14/2013 1121   LDLCALC 86 11/14/2013 1121    Physical Exam:    VS:  BP 120/64 (BP  Location: Left Arm, Patient Position: Sitting, Cuff Size: Normal)   Pulse 94   Ht 5' 2.5" (1.588 m)   Wt 193 lb (87.5 kg) Comment: Verbal from pt who got weighed at hospital the other day  SpO2 96%   BMI 34.74 kg/m     Wt Readings from Last 3 Encounters:  05/31/17 193 lb (87.5 kg)  05/27/17 193 lb 1.6 oz (87.6 kg)  12/13/16 191 lb (86.6 kg)     GEN:  Well nourished, well developed in no acute distress HEENT: Normal NECK: No JVD; No carotid bruits LYMPHATICS: No lymphadenopathy CARDIAC: Pacemaker pocket is well-healed no erythema or tenderness there is no effusion RRR, no murmurs, rubs, gallops RESPIRATORY:  Clear to auscultation without rales, wheezing or rhonchi  ABDOMEN: Soft, non-tender, non-distended MUSCULOSKELETAL:  No edema; No deformity  SKIN: Warm and dry NEUROLOGIC:  Alert and oriented x 3 PSYCHIATRIC:  Normal affect     Signed, Norman HerrlichBrian Denilson Salminen, MD  06/01/2017 8:10 AM    [facility]

## 2017-05-30 NOTE — Progress Notes (Addendum)
Anesthesia Chart Review: Patient is a 71 year old female scheduled for left anterior hip arthroplasty on 06/06/17 by Dr. Gean BirchwoodFrank Rowan. Anesthesia type is posted for spinal.  History includes never smoker, HTN, dysrhythmia, Medtronic dual chamber PPM (left side) 08/14/12, HLD, arthritis, GERD, hiatal hernia, impaired fasting glucose, CKD stage III, seborrheic dermatitis, nephrolithiasis, tonsillectomy, hysterectomy, left TKA '06 with multiple revisions (MSSA) '07 and reimplantation left TKA components 07/18/06 (on long-term prophylactic cephalexin, prescribed by Dr. Turner Danielsowan). BMI is consistent with obesity.   PCP is Dr. Nani Gasseratherine Metheney, last visit 12/13/16. Cardiologist was Dr. Dyane DustmanArchie Tyson, although not seen since at least 2017. There is a telephone encounter from 01/15/16 [facility]) indicating that she would not being coming back to [facility] office because Dr. Chales Abrahamsyson would no longer be with the practice. She did not want to sign any papers when staff offered to forward her records to another practice. Now with upcoming elective surgery, she is scheduled to see Dr. Norman HerrlichBrian Munley with [facility] on 05/31/17.   Meds include amlodipine, Norco, lisinopril-HCTZ, Keflex. She is not currently taking Coreg.   BP (!) 145/84   Pulse 94   Temp 36.7 C   Resp 20   Ht 5' 2.5" (1.588 m)   Wt 193 lb 1.6 oz (87.6 kg)   SpO2 99%   BMI 34.76 kg/m   EKG 05/27/17: NSR, ventricular paced rhythm.  Per Care Everywhere [facility]): Remote PPM check 10/22/15: Adapta Pacemaker-08/14/2012 - Implanted  DEVICE FINDINGS:  TECHNICAL:  Adequate Battery Voltage / Expected Longevity 8.5 years   STABLE lead impedances   STABLE sensing and pacing characteristics  CLINICAL:  NO Sustained Ventricular arrhythmias   NO Significant Atrial arrhythmias  AP 8.6% / VP 100%   Plan:Continue Routine Monitoring    Echo 08/14/12 [facility] Summary: Normal LV systolic function, estimated EF 65%, no appreciable segmental abnormality, borderline concentric LVH, mild MR, trace TR.   She denied prior cardiac cath. Reported any heart [facility] would have [facility].  CXR 05/27/17: FINDINGS: Lordotic technique on the frontal film demonstrates hypoinflated lungs without airspace consolidation or effusion. Cardiomediastinal silhouette is within normal. Left-sided pacemaker intact. Mild degenerate change of the spine per IMPRESSION: Hypoinflation without acute cardiopulmonary disease.  Preoperative labs noted.   Patient is scheduled to get re-established with a cardiologist tomorrow. Will leave chart for follow-up.   Velna Ochsllison Heyli Min, PA-C [facility]iology Phone [phone] 05/30/2017 2:49 PM  Addendum: Patient was evaluated by Dr. Dulce SellarMunley on 05/31/17. Her PPM interrogation is normal, stable thresholds and pacemaker dependent. He recommended a smart magnet intraoperatively. He felt she was fully optimized and could proceed with planned surgery.  Velna Ochsllison Lyfe Reihl, PA-C [facility] Phone [phone] 06/01/2017 9:22 AM

## 2017-05-31 ENCOUNTER — Ambulatory Visit (INDEPENDENT_AMBULATORY_CARE_PROVIDER_SITE_OTHER): Payer: Medicare Other | Admitting: Cardiology

## 2017-05-31 ENCOUNTER — Encounter: Payer: Self-pay | Admitting: Cardiology

## 2017-05-31 ENCOUNTER — Encounter (HOSPITAL_COMMUNITY): Payer: Self-pay

## 2017-05-31 VITALS — BP 120/64 | HR 94 | Ht 62.5 in | Wt 193.0 lb

## 2017-05-31 DIAGNOSIS — Z01810 Encounter for preprocedural cardiovascular examination: Secondary | ICD-10-CM | POA: Diagnosis not present

## 2017-05-31 DIAGNOSIS — Z950 Presence of cardiac pacemaker: Secondary | ICD-10-CM

## 2017-05-31 DIAGNOSIS — I129 Hypertensive chronic kidney disease with stage 1 through stage 4 chronic kidney disease, or unspecified chronic kidney disease: Secondary | ICD-10-CM | POA: Diagnosis not present

## 2017-05-31 NOTE — Patient Instructions (Signed)
Medication Instructions:  Your physician recommends that you continue on your current medications as directed. Please refer to the Current Medication list given to you today.  Labwork: None  Testing/Procedures: You will have your device interrogated today.  Follow-Up: Your physician recommends that you schedule a follow-up appointment after you see Dr. Elberta Fortisamnitz.  You will receive a phone call to schedule with electrophysiology-Dr. Elberta Fortisamnitz.  Any Other Special Instructions Will Be Listed Below (If Applicable).     If you need a refill on your cardiac medications before your next appointment, please call your pharmacy.

## 2017-06-03 DIAGNOSIS — M1612 Unilateral primary osteoarthritis, left hip: Secondary | ICD-10-CM | POA: Diagnosis present

## 2017-06-03 MED ORDER — LACTATED RINGERS IV SOLN: INTRAVENOUS | Status: DC

## 2017-06-03 MED ORDER — TRANEXAMIC ACID 1000 MG/10ML IV SOLN
2000.0000 mg | INTRAVENOUS | Status: DC
  Filled 2017-06-03: qty 20

## 2017-06-03 MED ORDER — CEFAZOLIN SODIUM-DEXTROSE 2-4 GM/100ML-% IV SOLN
2.0000 g | INTRAVENOUS | Status: AC
  Administered 2017-06-06: 2 g via INTRAVENOUS
  Filled 2017-06-03: qty 100

## 2017-06-03 NOTE — H&P (Signed)
TOTAL HIP ADMISSION H&P  Patient is admitted for left total hip arthroplasty.  Subjective:  Chief Complaint: left hip pain  HPI: [name], 71 y.o. female, has a history of pain and functional disability in the left hip(s) due to arthritis and patient has failed non-surgical conservative treatments for greater than 12 weeks to include NSAID's and/or analgesics, use of assistive devices, weight reduction as appropriate and activity modification.  Onset of symptoms was gradual starting several years ago with gradually worsening course since that time.The patient noted no past surgery on the left hip(s).  Patient currently rates pain in the left hip at 10 out of 10 with activity. Patient has night pain, worsening of pain with activity and weight bearing, trendelenberg gait, pain that interfers with activities of daily living and pain with passive range of motion. Patient has evidence of subchondral cysts, joint subluxation and joint space narrowing by imaging studies. This condition presents safety issues increasing the risk of falls.   There is no current active infection.  Patient Active Problem List   Diagnosis Date Noted  . Preoperative cardiovascular examination 05/30/2017  . Seborrheic dermatitis 06/12/2014  . Pacemaker 09/21/2012  . CKD (chronic kidney disease) stage 3, GFR 30-59 ml/min (HCC) 09/21/2012  . Hyperlipidemia 08/10/2010  . Impaired fasting glucose 08/10/2010  . GERD 10/04/2007  . HIATAL HERNIA WITH REFLUX 10/04/2007  . 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 Arthritis   . CKD (chronic kidney disease) stage 3, GFR 30-59 ml/min (HCC) 09/21/2012  . Complication of anesthesia    slow to wake up  . Dysrhythmia   . GERD 10/04/2007   Qualifier: Diagnosis of  By: Linford ArnoldMetheney MD, Santina Evansatherine    . HIATAL HERNIA WITH REFLUX 10/04/2007   Qualifier: Diagnosis of  By: Linford ArnoldMetheney MD, Santina Evansatherine    . History of kidney  stones   . Hyperlipidemia 08/10/2010   Qualifier: Diagnosis of  By: Linford ArnoldMetheney MD, Santina Evansatherine    . Hypertension   . HYPERTENSION, BENIGN ESSENTIAL 04/27/2007   Qualifier: Diagnosis of  By: Linford ArnoldMetheney MD, Santina Evansatherine    . Impaired fasting glucose 08/10/2010   Qualifier: Diagnosis of  By: Linford ArnoldMetheney MD, Santina Evansatherine    . Pacemaker 09/21/2012  . Pneumonia    hx. pneumonia  . 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 Laurier NancyZann Tyson, [facility]   . Seborrheic dermatitis 06/12/2014    Past Surgical History:  Procedure Laterality Date  . ABDOMINAL HYSTERECTOMY    . BACK SURGERY     X 2  . bladder tack    . CARPAL TUNNEL RELEASE  2012   RT  . INSERT / REPLACE / REMOVE PACEMAKER    . LT knee replacement  2006   new hardware 2007 due to staph infection, on chronic keflex  . PACEMAKER INSERTION    . TONSILLECTOMY    . TRANSTHORACIC ECHOCARDIOGRAM     08/14/12 [facility]): NL LVF, no segmental abnormality, borderline LVH, mild MR, mild MR, trace TR    Current Facility-Administered Medications  Medication Dose Route Frequency Provider Last Rate Last Dose  . [START ON 06/06/2017] ceFAZolin (ANCEF) IVPB 2g/100 mL premix  2 g Intravenous To Ladonna SnideSS-Surg Rowan, Frank, MD      . Melene Muller[START ON 06/06/2017] lactated ringers infusion   Intravenous Continuous Gean Birchwoodowan, Frank, MD      . [  START ON 06/06/2017] tranexamic acid (CYKLOKAPRON) 2,000 mg in sodium chloride 0.9 % 50 mL Topical Application  2,000 mg Topical To OR [name], MD       Current Outpatient Prescriptions  Medication Sig Dispense Refill Last Dose  . amLODipine (NORVASC) 5 MG tablet Take 1 tablet (5 mg total) by mouth daily. 90 tablet 1 Taking  . celecoxib (CELEBREX) 200 MG capsule Take 200 mg by mouth 2 (two) times daily.   0 Taking  . cephALEXin (KEFLEX) 500 MG capsule Take 500 mg by mouth 2 (two) times daily.   0 Taking  . HYDROcodone-acetaminophen (NORCO)  7.5-325 MG tablet Take 1 tablet by mouth 2 (two) times daily.   0 Taking  . lisinopril-hydrochlorothiazide (PRINZIDE,ZESTORETIC) 20-12.5 MG tablet Take 1 tablet by mouth 2 (two) times daily. 180 tablet 1 Taking  . carvedilol (COREG) 6.25 MG tablet Take 1 tablet (6.25 mg total) by mouth 2 (two) times daily with a meal. (Patient not taking: Reported on 05/18/2017) 180 tablet 3 Not Taking   No Known Allergies  Social History  Substance Use Topics  . Smoking status: Never Smoker  . Smokeless tobacco: Never Used  . Alcohol use No    Family History  Problem Relation Age of Onset  . Heart attack Mother   . Diabetes Mother 64  . Hypertension Mother   . Hypertension Father      Review of Systems  Constitutional: Positive for diaphoresis.  HENT: Negative.   Eyes: Negative.   Respiratory: Positive for shortness of breath.   Cardiovascular:       Htn  Gastrointestinal: Negative.   Genitourinary: Negative.   Musculoskeletal: Positive for joint pain.  Skin: Negative.   Neurological: Negative.   Endo/Heme/Allergies: Negative.   Psychiatric/Behavioral: Negative.     Objective:  Physical Exam  Constitutional: She is oriented to person, place, and time. She appears well-developed and well-nourished.  HENT:  Head: Normocephalic and atraumatic.  Eyes: Pupils are equal, round, and reactive to light.  Neck: Normal range of motion. Neck supple.  Cardiovascular: Intact distal pulses.   Respiratory: Effort normal.  Musculoskeletal: She exhibits tenderness.  Any attempts at internal rotation of the left hip causes severe pain.  Foot tap is trace positive.  Neurological: She is alert and oriented to person, place, and time.  Skin: Skin is warm and dry.  Psychiatric: She has a normal mood and affect. Her behavior is normal. Judgment and thought content normal.    Vital signs in last 24 hours:    Labs:   Estimated body mass index is 34.74 kg/m as calculated from the following:   Height  as of 05/31/17: 5' 2.5" (1.588 m).   Weight as of 05/31/17: 87.5 kg (193 lb).   Imaging Review Plain radiographs demonstrate  AP pelvis and crosstable lateral of the left hip show bone-on-bone end-stage arthritis with lateral subluxation of the left femoral head and peripheral osteophytes.  Assessment/Plan:  End stage arthritis, left hip(s)  The patient history, physical examination, clinical judgement of the provider and imaging studies are consistent with end stage degenerative joint disease of the left hip(s) and total hip arthroplasty is deemed medically necessary. The treatment options including medical management, injection therapy, arthroscopy and arthroplasty were discussed at length. The risks and benefits of total hip arthroplasty were presented and reviewed. The risks due to aseptic loosening, infection, stiffness, dislocation/subluxation,  thromboembolic complications and other imponderables were discussed.  The patient acknowledged the explanation, agreed to proceed with the plan  and consent was signed. Patient is being admitted for inpatient treatment for surgery, pain control, PT, OT, prophylactic antibiotics, VTE prophylaxis, progressive ambulation and ADL's and discharge planning.The patient is planning to be discharged to skilled nursing facility

## 2017-06-06 ENCOUNTER — Encounter (HOSPITAL_COMMUNITY)
Admission: RE | Disposition: A | Discharge status: Home Health | Payer: Self-pay | Source: Ambulatory Visit | Attending: Orthopedic Surgery

## 2017-06-06 ENCOUNTER — Inpatient Hospital Stay (HOSPITAL_COMMUNITY): Payer: Medicare Other

## 2017-06-06 ENCOUNTER — Encounter (HOSPITAL_COMMUNITY): Payer: Self-pay

## 2017-06-06 ENCOUNTER — Other Ambulatory Visit: Payer: Self-pay

## 2017-06-06 ENCOUNTER — Inpatient Hospital Stay (HOSPITAL_COMMUNITY): Payer: Medicare Other | Admitting: Anesthesiology

## 2017-06-06 ENCOUNTER — Inpatient Hospital Stay (HOSPITAL_COMMUNITY): Payer: Medicare Other | Admitting: Vascular Surgery

## 2017-06-06 ENCOUNTER — Inpatient Hospital Stay (HOSPITAL_COMMUNITY)
Admission: RE | Admit: 2017-06-06 | Discharge: 2017-06-07 | DRG: 470 | Disposition: A | Discharge status: Home Health | Payer: Medicare Other | Source: Ambulatory Visit | Attending: Orthopedic Surgery | Admitting: Orthopedic Surgery

## 2017-06-06 DIAGNOSIS — M24352 Pathological dislocation of left hip, not elsewhere classified: Secondary | ICD-10-CM | POA: Diagnosis present

## 2017-06-06 DIAGNOSIS — M25552 Pain in left hip: Secondary | ICD-10-CM | POA: Diagnosis present

## 2017-06-06 DIAGNOSIS — M1612 Unilateral primary osteoarthritis, left hip: Secondary | ICD-10-CM | POA: Diagnosis present

## 2017-06-06 DIAGNOSIS — M545 Low back pain: Secondary | ICD-10-CM | POA: Diagnosis present

## 2017-06-06 DIAGNOSIS — Z950 Presence of cardiac pacemaker: Secondary | ICD-10-CM

## 2017-06-06 DIAGNOSIS — I129 Hypertensive chronic kidney disease with stage 1 through stage 4 chronic kidney disease, or unspecified chronic kidney disease: Secondary | ICD-10-CM | POA: Diagnosis present

## 2017-06-06 DIAGNOSIS — Z8701 Personal history of pneumonia (recurrent): Secondary | ICD-10-CM

## 2017-06-06 DIAGNOSIS — Z96652 Presence of left artificial knee joint: Secondary | ICD-10-CM | POA: Diagnosis present

## 2017-06-06 DIAGNOSIS — G8929 Other chronic pain: Secondary | ICD-10-CM | POA: Diagnosis present

## 2017-06-06 DIAGNOSIS — Z791 Long term (current) use of non-steroidal anti-inflammatories (NSAID): Secondary | ICD-10-CM | POA: Diagnosis not present

## 2017-06-06 DIAGNOSIS — X58XXXA Exposure to other specified factors, initial encounter: Secondary | ICD-10-CM | POA: Diagnosis present

## 2017-06-06 DIAGNOSIS — Z8249 Family history of ischemic heart disease and other diseases of the circulatory system: Secondary | ICD-10-CM

## 2017-06-06 DIAGNOSIS — Z87442 Personal history of urinary calculi: Secondary | ICD-10-CM

## 2017-06-06 DIAGNOSIS — Z90710 Acquired absence of both cervix and uterus: Secondary | ICD-10-CM

## 2017-06-06 DIAGNOSIS — Z419 Encounter for procedure for purposes other than remedying health state, unspecified: Secondary | ICD-10-CM

## 2017-06-06 DIAGNOSIS — D62 Acute posthemorrhagic anemia: Secondary | ICD-10-CM | POA: Diagnosis not present

## 2017-06-06 DIAGNOSIS — N183 Chronic kidney disease, stage 3 (moderate): Secondary | ICD-10-CM | POA: Diagnosis present

## 2017-06-06 HISTORY — DX: Other cervical disc degeneration, unspecified cervical region: M50.30

## 2017-06-06 HISTORY — DX: Personal history of other diseases of the digestive system: Z87.19

## 2017-06-06 HISTORY — DX: Low back pain: M54.5

## 2017-06-06 HISTORY — DX: Other chronic pain: G89.29

## 2017-06-06 HISTORY — DX: Low back pain, unspecified: M54.50

## 2017-06-06 HISTORY — DX: Lobar pneumonia, unspecified organism: J18.1

## 2017-06-06 HISTORY — DX: Esophageal obstruction: K22.2

## 2017-06-06 HISTORY — PX: TOTAL HIP ARTHROPLASTY: SHX124

## 2017-06-06 SURGERY — ARTHROPLASTY, HIP, TOTAL, ANTERIOR APPROACH
Anesthesia: General | Laterality: Left

## 2017-06-06 MED ORDER — CEPHALEXIN 500 MG PO CAPS: 500.0000 mg | ORAL_CAPSULE | Freq: Two times a day (BID) | ORAL | Status: DC

## 2017-06-06 MED ORDER — FLEET ENEMA 7-19 GM/118ML RE ENEM: 1.0000 | ENEMA | Freq: Once | RECTAL | Status: DC | PRN

## 2017-06-06 MED ORDER — HYDROMORPHONE HCL 1 MG/ML IJ SOLN
0.2500 mg | INTRAMUSCULAR | Status: DC | PRN
  Administered 2017-06-06 (×3): 0.5 mg via INTRAVENOUS

## 2017-06-06 MED ORDER — BUPIVACAINE-EPINEPHRINE (PF) 0.25% -1:200000 IJ SOLN
INTRAMUSCULAR | Status: AC
  Filled 2017-06-06: qty 60

## 2017-06-06 MED ORDER — DEXAMETHASONE SODIUM PHOSPHATE 10 MG/ML IJ SOLN
10.0000 mg | Freq: Once | INTRAMUSCULAR | Status: AC
  Administered 2017-06-07: 10 mg via INTRAVENOUS
  Filled 2017-06-06: qty 1

## 2017-06-06 MED ORDER — PHENOL 1.4 % MT LIQD: 1.0000 | OROMUCOSAL | Status: DC | PRN

## 2017-06-06 MED ORDER — HYDROCHLOROTHIAZIDE 12.5 MG PO CAPS
12.5000 mg | ORAL_CAPSULE | Freq: Two times a day (BID) | ORAL | Status: DC
  Administered 2017-06-06 – 2017-06-07 (×2): 12.5 mg via ORAL
  Filled 2017-06-06 (×2): qty 1

## 2017-06-06 MED ORDER — ONDANSETRON HCL 4 MG PO TABS: 4.0000 mg | ORAL_TABLET | Freq: Four times a day (QID) | ORAL | Status: DC | PRN

## 2017-06-06 MED ORDER — BUPIVACAINE LIPOSOME 1.3 % IJ SUSP
INTRAMUSCULAR | Status: DC | PRN
  Administered 2017-06-06: 20 mL

## 2017-06-06 MED ORDER — DOCUSATE SODIUM 100 MG PO CAPS
100.0000 mg | ORAL_CAPSULE | Freq: Two times a day (BID) | ORAL | Status: DC
  Administered 2017-06-06 – 2017-06-07 (×2): 100 mg via ORAL
  Filled 2017-06-06 (×2): qty 1

## 2017-06-06 MED ORDER — CELECOXIB 200 MG PO CAPS
200.0000 mg | ORAL_CAPSULE | Freq: Two times a day (BID) | ORAL | Status: DC
  Administered 2017-06-06 – 2017-06-07 (×2): 200 mg via ORAL
  Filled 2017-06-06 (×2): qty 1

## 2017-06-06 MED ORDER — GABAPENTIN 300 MG PO CAPS
300.0000 mg | ORAL_CAPSULE | Freq: Three times a day (TID) | ORAL | Status: DC
  Administered 2017-06-06 – 2017-06-07 (×3): 300 mg via ORAL
  Filled 2017-06-06 (×3): qty 1

## 2017-06-06 MED ORDER — ONDANSETRON HCL 4 MG/2ML IJ SOLN: 4.0000 mg | Freq: Four times a day (QID) | INTRAMUSCULAR | Status: DC | PRN

## 2017-06-06 MED ORDER — HYDROMORPHONE HCL 1 MG/ML IJ SOLN
INTRAMUSCULAR | Status: AC
  Administered 2017-06-06: 0.5 mg via INTRAVENOUS
  Filled 2017-06-06: qty 1

## 2017-06-06 MED ORDER — PHENYLEPHRINE HCL 10 MG/ML IJ SOLN
INTRAMUSCULAR | Status: DC | PRN
  Administered 2017-06-06 (×3): 80 ug via INTRAVENOUS

## 2017-06-06 MED ORDER — ASPIRIN EC 325 MG PO TBEC: 325.0000 mg | DELAYED_RELEASE_TABLET | Freq: Two times a day (BID) | ORAL | 0 refills | Status: DC

## 2017-06-06 MED ORDER — METHOCARBAMOL 500 MG PO TABS
500.0000 mg | ORAL_TABLET | Freq: Four times a day (QID) | ORAL | Status: DC | PRN
  Administered 2017-06-06 (×2): 500 mg via ORAL
  Filled 2017-06-06: qty 1

## 2017-06-06 MED ORDER — HYDROCODONE-ACETAMINOPHEN 7.5-325 MG PO TABS
1.0000 | ORAL_TABLET | ORAL | Status: DC | PRN
  Administered 2017-06-06: 1 via ORAL
  Filled 2017-06-06: qty 1

## 2017-06-06 MED ORDER — PHENYLEPHRINE HCL 10 MG/ML IJ SOLN
INTRAMUSCULAR | Status: DC | PRN
  Administered 2017-06-06: 15 ug/min via INTRAVENOUS

## 2017-06-06 MED ORDER — ACETAMINOPHEN 325 MG PO TABS: 650.0000 mg | ORAL_TABLET | ORAL | Status: DC | PRN

## 2017-06-06 MED ORDER — PROPOFOL 10 MG/ML IV BOLUS
INTRAVENOUS | Status: AC
  Filled 2017-06-06: qty 20

## 2017-06-06 MED ORDER — CEFAZOLIN SODIUM-DEXTROSE 2-4 GM/100ML-% IV SOLN
2.0000 g | Freq: Four times a day (QID) | INTRAVENOUS | Status: DC
  Filled 2017-06-06: qty 100

## 2017-06-06 MED ORDER — LISINOPRIL 20 MG PO TABS
20.0000 mg | ORAL_TABLET | Freq: Two times a day (BID) | ORAL | Status: DC
  Administered 2017-06-06 – 2017-06-07 (×2): 20 mg via ORAL
  Filled 2017-06-06 (×2): qty 1

## 2017-06-06 MED ORDER — METOCLOPRAMIDE HCL 5 MG/ML IJ SOLN: 5.0000 mg | Freq: Three times a day (TID) | INTRAMUSCULAR | Status: DC | PRN

## 2017-06-06 MED ORDER — ALUMINUM HYDROXIDE GEL 320 MG/5ML PO SUSP
15.0000 mL | ORAL | Status: DC | PRN
  Filled 2017-06-06: qty 30

## 2017-06-06 MED ORDER — FENTANYL CITRATE (PF) 250 MCG/5ML IJ SOLN
INTRAMUSCULAR | Status: DC | PRN
  Administered 2017-06-06 (×4): 50 ug via INTRAVENOUS

## 2017-06-06 MED ORDER — SUGAMMADEX SODIUM 200 MG/2ML IV SOLN
INTRAVENOUS | Status: DC | PRN
  Administered 2017-06-06: 200 mg via INTRAVENOUS

## 2017-06-06 MED ORDER — METHOCARBAMOL 500 MG PO TABS
ORAL_TABLET | ORAL | Status: AC
  Administered 2017-06-06: 500 mg via ORAL
  Filled 2017-06-06: qty 1

## 2017-06-06 MED ORDER — OXYCODONE-ACETAMINOPHEN 5-325 MG PO TABS: 1.0000 | ORAL_TABLET | ORAL | 0 refills | Status: DC | PRN

## 2017-06-06 MED ORDER — PROMETHAZINE HCL 25 MG/ML IJ SOLN: 6.2500 mg | INTRAMUSCULAR | Status: DC | PRN

## 2017-06-06 MED ORDER — DIPHENHYDRAMINE HCL 12.5 MG/5ML PO ELIX: 12.5000 mg | ORAL_SOLUTION | ORAL | Status: DC | PRN

## 2017-06-06 MED ORDER — LISINOPRIL-HYDROCHLOROTHIAZIDE 20-12.5 MG PO TABS: 1.0000 | ORAL_TABLET | Freq: Two times a day (BID) | ORAL | Status: DC

## 2017-06-06 MED ORDER — ASPIRIN EC 325 MG PO TBEC
325.0000 mg | DELAYED_RELEASE_TABLET | Freq: Every day | ORAL | Status: DC
  Administered 2017-06-07: 325 mg via ORAL
  Filled 2017-06-06: qty 1

## 2017-06-06 MED ORDER — ACETAMINOPHEN 650 MG RE SUPP: 650.0000 mg | RECTAL | Status: DC | PRN

## 2017-06-06 MED ORDER — ONDANSETRON HCL 4 MG/2ML IJ SOLN
INTRAMUSCULAR | Status: DC | PRN
  Administered 2017-06-06: 4 mg via INTRAVENOUS

## 2017-06-06 MED ORDER — TRANEXAMIC ACID 1000 MG/10ML IV SOLN
1000.0000 mg | INTRAVENOUS | Status: AC
  Administered 2017-06-06: 1000 mg via INTRAVENOUS
  Filled 2017-06-06: qty 10

## 2017-06-06 MED ORDER — BUPIVACAINE-EPINEPHRINE (PF) 0.5% -1:200000 IJ SOLN
INTRAMUSCULAR | Status: DC | PRN
  Administered 2017-06-06: 20 mL via PERINEURAL
  Administered 2017-06-06: 30 mL via PERINEURAL

## 2017-06-06 MED ORDER — 0.9 % SODIUM CHLORIDE (POUR BTL) OPTIME
TOPICAL | Status: DC | PRN
Start: 2017-06-06 — End: 2017-06-06
  Administered 2017-06-06: 1000 mL

## 2017-06-06 MED ORDER — LACTATED RINGERS IV SOLN
INTRAVENOUS | Status: DC | PRN
Start: 2017-06-06 — End: 2017-06-06
  Administered 2017-06-06 (×2): via INTRAVENOUS

## 2017-06-06 MED ORDER — PHENYLEPHRINE HCL 10 MG/ML IJ SOLN: INTRAMUSCULAR | Status: DC | PRN

## 2017-06-06 MED ORDER — MIDAZOLAM HCL 2 MG/2ML IJ SOLN
INTRAMUSCULAR | Status: DC | PRN
  Administered 2017-06-06: 2 mg via INTRAVENOUS

## 2017-06-06 MED ORDER — CHLORHEXIDINE GLUCONATE 4 % EX LIQD: 60.0000 mL | Freq: Once | CUTANEOUS | Status: DC

## 2017-06-06 MED ORDER — DEXAMETHASONE SODIUM PHOSPHATE 10 MG/ML IJ SOLN
INTRAMUSCULAR | Status: DC | PRN
  Administered 2017-06-06: 10 mg via INTRAVENOUS

## 2017-06-06 MED ORDER — BISACODYL 5 MG PO TBEC: 5.0000 mg | DELAYED_RELEASE_TABLET | Freq: Every day | ORAL | Status: DC | PRN

## 2017-06-06 MED ORDER — OXYCODONE HCL 5 MG PO TABS
10.0000 mg | ORAL_TABLET | ORAL | Status: DC | PRN
  Administered 2017-06-06: 10 mg via ORAL

## 2017-06-06 MED ORDER — KCL IN DEXTROSE-NACL 20-5-0.45 MEQ/L-%-% IV SOLN
INTRAVENOUS | Status: DC
Start: 2017-06-06 — End: 2017-06-07
  Administered 2017-06-06: 18:00:00 via INTRAVENOUS
  Administered 2017-06-07: 125 mL via INTRAVENOUS
  Filled 2017-06-06 (×2): qty 1000

## 2017-06-06 MED ORDER — HYDROMORPHONE HCL 1 MG/ML IJ SOLN: 0.5000 mg | INTRAMUSCULAR | Status: DC | PRN

## 2017-06-06 MED ORDER — LIDOCAINE 2% (20 MG/ML) 5 ML SYRINGE
INTRAMUSCULAR | Status: DC | PRN
  Administered 2017-06-06: 60 mg via INTRAVENOUS

## 2017-06-06 MED ORDER — TIZANIDINE HCL 2 MG PO TABS: 2.0000 mg | ORAL_TABLET | Freq: Four times a day (QID) | ORAL | 0 refills | Status: DC | PRN

## 2017-06-06 MED ORDER — POLYETHYLENE GLYCOL 3350 17 G PO PACK: 17.0000 g | PACK | Freq: Every day | ORAL | Status: DC | PRN

## 2017-06-06 MED ORDER — BUPIVACAINE LIPOSOME 1.3 % IJ SUSP
20.0000 mL | Freq: Once | INTRAMUSCULAR | Status: DC
  Filled 2017-06-06: qty 20

## 2017-06-06 MED ORDER — TRANEXAMIC ACID 1000 MG/10ML IV SOLN
INTRAVENOUS | Status: AC | PRN
  Administered 2017-06-06: 2000 mg via TOPICAL

## 2017-06-06 MED ORDER — AMLODIPINE BESYLATE 5 MG PO TABS
5.0000 mg | ORAL_TABLET | Freq: Every day | ORAL | Status: DC
  Administered 2017-06-07: 5 mg via ORAL
  Filled 2017-06-06: qty 1

## 2017-06-06 MED ORDER — TRANEXAMIC ACID 1000 MG/10ML IV SOLN
1000.0000 mg | Freq: Once | INTRAVENOUS | Status: AC
  Administered 2017-06-06: 1000 mg via INTRAVENOUS
  Filled 2017-06-06: qty 10

## 2017-06-06 MED ORDER — OXYCODONE HCL 5 MG PO TABS
ORAL_TABLET | ORAL | Status: AC
  Administered 2017-06-06: 10 mg via ORAL
  Filled 2017-06-06: qty 2

## 2017-06-06 MED ORDER — PROPOFOL 10 MG/ML IV BOLUS
INTRAVENOUS | Status: DC | PRN
  Administered 2017-06-06: 120 mg via INTRAVENOUS

## 2017-06-06 MED ORDER — METOCLOPRAMIDE HCL 5 MG PO TABS: 5.0000 mg | ORAL_TABLET | Freq: Three times a day (TID) | ORAL | Status: DC | PRN

## 2017-06-06 MED ORDER — ROCURONIUM 10MG/ML (10ML) SYRINGE FOR MEDFUSION PUMP - OPTIME
INTRAVENOUS | Status: DC | PRN
  Administered 2017-06-06: 50 mg via INTRAVENOUS
  Administered 2017-06-06: 20 mg via INTRAVENOUS

## 2017-06-06 MED ORDER — MIDAZOLAM HCL 2 MG/2ML IJ SOLN
INTRAMUSCULAR | Status: AC
  Filled 2017-06-06: qty 2

## 2017-06-06 MED ORDER — MENTHOL 3 MG MT LOZG: 1.0000 | LOZENGE | OROMUCOSAL | Status: DC | PRN

## 2017-06-06 MED ORDER — METHOCARBAMOL 1000 MG/10ML IJ SOLN
500.0000 mg | Freq: Four times a day (QID) | INTRAMUSCULAR | Status: DC | PRN
  Filled 2017-06-06: qty 5

## 2017-06-06 MED ORDER — FENTANYL CITRATE (PF) 250 MCG/5ML IJ SOLN
INTRAMUSCULAR | Status: AC
  Filled 2017-06-06: qty 5

## 2017-06-06 SURGICAL SUPPLY — 44 items
BAG DECANTER FOR FLEXI CONT (MISCELLANEOUS) ×3 IMPLANT
BLADE SAW SGTL 18X1.27X75 (BLADE) ×2 IMPLANT
BLADE SAW SGTL 18X1.27X75MM (BLADE) ×1
CAPT HIP TOTAL 2 ×2 IMPLANT
COVER PERINEAL POST (MISCELLANEOUS) ×3 IMPLANT
COVER SURGICAL LIGHT HANDLE (MISCELLANEOUS) ×3 IMPLANT
DRAPE C-ARM 42X72 X-RAY (DRAPES) ×3 IMPLANT
DRAPE STERI IOBAN 125X83 (DRAPES) ×3 IMPLANT
DRAPE U-SHAPE 47X51 STRL (DRAPES) ×6 IMPLANT
DRSG AQUACEL AG ADV 3.5X10 (GAUZE/BANDAGES/DRESSINGS) ×3 IMPLANT
DURAPREP 26ML APPLICATOR (WOUND CARE) ×3 IMPLANT
ELECT BLADE 4.0 EZ CLEAN MEGAD (MISCELLANEOUS) ×3
ELECT REM PT RETURN 9FT ADLT (ELECTROSURGICAL) ×3
ELECTRODE BLDE 4.0 EZ CLN MEGD (MISCELLANEOUS) ×1 IMPLANT
ELECTRODE REM PT RTRN 9FT ADLT (ELECTROSURGICAL) ×1 IMPLANT
FACESHIELD WRAPAROUND (MASK) ×6 IMPLANT
FACESHIELD WRAPAROUND OR TEAM (MASK) ×2 IMPLANT
GLOVE BIO SURGEON STRL SZ7.5 (GLOVE) ×3 IMPLANT
GLOVE BIO SURGEON STRL SZ8.5 (GLOVE) ×3 IMPLANT
GLOVE BIOGEL PI IND STRL 8 (GLOVE) ×1 IMPLANT
GLOVE BIOGEL PI IND STRL 9 (GLOVE) ×1 IMPLANT
GLOVE BIOGEL PI INDICATOR 8 (GLOVE) ×2
GLOVE BIOGEL PI INDICATOR 9 (GLOVE) ×2
GOWN STRL REUS W/ TWL LRG LVL3 (GOWN DISPOSABLE) ×1 IMPLANT
GOWN STRL REUS W/ TWL XL LVL3 (GOWN DISPOSABLE) ×2 IMPLANT
GOWN STRL REUS W/TWL LRG LVL3 (GOWN DISPOSABLE) ×3
GOWN STRL REUS W/TWL XL LVL3 (GOWN DISPOSABLE) ×6
KIT BASIN OR (CUSTOM PROCEDURE TRAY) ×3 IMPLANT
KIT ROOM TURNOVER OR (KITS) ×3 IMPLANT
MANIFOLD NEPTUNE II (INSTRUMENTS) ×3 IMPLANT
NEEDLE HYPO 22GX1.5 SAFETY (NEEDLE) ×6 IMPLANT
NS IRRIG 1000ML POUR BTL (IV SOLUTION) ×3 IMPLANT
PACK TOTAL JOINT (CUSTOM PROCEDURE TRAY) ×3 IMPLANT
PAD ARMBOARD 7.5X6 YLW CONV (MISCELLANEOUS) ×6 IMPLANT
SUT VIC AB 1 CTX 36 (SUTURE) ×3
SUT VIC AB 1 CTX36XBRD ANBCTR (SUTURE) ×1 IMPLANT
SUT VIC AB 2-0 CT1 27 (SUTURE) ×3
SUT VIC AB 2-0 CT1 TAPERPNT 27 (SUTURE) ×1 IMPLANT
SUT VIC AB 3-0 CT1 27 (SUTURE) ×3
SUT VIC AB 3-0 CT1 TAPERPNT 27 (SUTURE) ×1 IMPLANT
SYR CONTROL 10ML LL (SYRINGE) ×6 IMPLANT
TOWEL OR 17X24 6PK STRL BLUE (TOWEL DISPOSABLE) ×3 IMPLANT
TOWEL OR 17X26 10 PK STRL BLUE (TOWEL DISPOSABLE) ×3 IMPLANT
TRAY CATH 16FR W/PLASTIC CATH (SET/KITS/TRAYS/PACK) IMPLANT

## 2017-06-06 NOTE — Anesthesia Procedure Notes (Addendum)
Procedure Name: Intubation Date/Time: 06/06/2017 12:25 PM Performed by: Bryson Corona, CRNA Pre-anesthesia Checklist: Patient identified, Emergency Drugs available, Suction available, Patient being monitored and Timeout performed Patient Re-evaluated:Patient Re-evaluated prior to induction Oxygen Delivery Method: Circle system utilized Preoxygenation: Pre-oxygenation with 100% oxygen Induction Type: IV induction Ventilation: Mask ventilation without difficulty Laryngoscope Size: Mac and 3 Grade View: Grade I Tube type: Oral Tube size: 7.0 mm Number of attempts: 1 Airway Equipment and Method: Stylet Placement Confirmation: ETT inserted through vocal cords under direct vision,  breath sounds checked- equal and bilateral and positive ETCO2 Secured at: 22 cm Tube secured with: Tape Dental Injury: Teeth and Oropharynx as per pre-operative assessment

## 2017-06-06 NOTE — Transfer of Care (Signed)
Immediate Anesthesia Transfer of Care Note  Patient: Marylene LandBetty P Florance  Procedure(s) Performed: TOTAL HIP ARTHROPLASTY ANTERIOR APPROACH (Left )  Patient Location: PACU  Anesthesia Type:General  Level of Consciousness: awake and alert   Airway & Oxygen Therapy: Patient Spontanous Breathing  Post-op Assessment: Report given to RN and Post -op Vital signs reviewed and stable  Post vital signs: Reviewed and stable  Last Vitals:  Vitals:   06/06/17 1028 06/06/17 1409  BP: (!) 160/65   Pulse: 89   Resp: 16   Temp: 36.9 C (P) 36.9 C  SpO2: 100%     Last Pain:  Vitals:   06/06/17 1028  TempSrc: Oral  PainSc: 5       Patients Stated Pain Goal: 2 (06/06/17 1028)  Complications: No apparent anesthesia complications

## 2017-06-06 NOTE — Interval H&P Note (Signed)
History and Physical Interval Note:  06/06/2017 11:01 AM  [name]  has presented today for surgery, with the diagnosis of LEFT HIP OSTEOARTHRITIS  The various methods of treatment have been discussed with the patient and family. After consideration of risks, benefits and other options for treatment, the patient has consented to  Procedure(s): TOTAL HIP ARTHROPLASTY ANTERIOR APPROACH (Left) as a surgical intervention .  The patient's history has been reviewed, patient examined, no change in status, stable for surgery.  I have reviewed the patient's chart and labs.  Questions were answered to the patient's satisfaction.     Nestor LewandowskyOWAN,Dayanira Giovannetti J

## 2017-06-06 NOTE — Op Note (Signed)
OPERATIVE REPORT    DATE OF PROCEDURE:  06/06/2017       PREOPERATIVE DIAGNOSIS:  LEFT HIP OSTEOARTHRITIS                                                          POSTOPERATIVE DIAGNOSIS:  LEFT HIP OSTEOARTHRITIS                                                           PROCEDURE: Anterior L total hip arthroplasty using a 48 mm DePuy Pinnacle  Cup, [employer], 0-degree polyethylene liner, a +1 32 mm ceramic head, a 3 std Depuy Triloc stem   SURGEON: Antoria Lanza [name]    ASSISTANT:   Eric K. [employer]  (present throughout entire procedure and necessary for timely completion of the procedure)   ANESTHESIA: Spinal BLOOD LOSS: 300 FLUID REPLACEMENT: 1500 crystalloid Antibiotic: 2gm ancef Tranexamic Acid: 1gm IV, 2gm Topical COMPLICATIONS: none    INDICATIONS FOR PROCEDURE: A 71 y.o. year-old With  LEFT HIP OSTEOARTHRITIS   for 5 years, x-rays show bone-on-bone arthritic changes, and osteophytes. Despite conservative measures with observation, anti-inflammatory medicine, narcotics, use of a cane, has severe unremitting pain and can ambulate only a few blocks before resting. Patient desires elective L total hip arthroplasty to decrease pain and increase function. The risks, benefits, and alternatives were discussed at length including but not limited to the risks of infection, bleeding, nerve injury, stiffness, blood clots, the need for revision surgery, cardiopulmonary complications, among others, and they were willing to proceed. Questions answered     PROCEDURE IN DETAIL: The patient was identified by armband,  received preoperative IV antibiotics in the holding area at [facility], taken to the operating room , appropriate anesthetic monitors  were attached and  anesthesia was induced with the patienton the gurney. The HANA boots were applied to the feet and he was then transferred to the HANA table with a peroneal post and support underneath the non-operative le,  which was locked in 5 lb traction. Theoperative lower extremity was then prepped and draped in the usual sterile fashion from just above the iliac crest to the knee. And a timeout procedure was performed. We then made a 12 cm incision along the interval at the leading edge of the tensor fascia lata of starting at 2 cm lateral to and 2 cm distal to the ASIS. Small bleeders in the skin and subcutaneous tissue identified and cauterized we dissected down to the fascia and made an incision in the fascia allowing [place] to elevate the fascia of the tensor muscle and exploited the interval between the rectus and the tensor fascia lata. A Hohmann retractor was then placed along the superior neck of the femur and a Cobra retractor along the inferior neck of the femur we teed the capsule starting out at the superior anterior aspect of the acetabulum going distally and made the T along the neck both leaflets of the T were tagged with #2 Ethibond suture. Cobra retractors were then placed along the inferior and superior neck allowing [place] to perform a standard neck cut and  removed the femoral head with a power corkscrew. We then placed a right angle Hohmann retractor along the anterior aspect of the acetabulum a spiked Cobra in the cotyloid notch and posteriorly a Muelller retractor. We then sequentially reamed up to a 48 mm basket reamer obtaining good coverage in all quadrants, verified by C-arm imaging. Under C-arm control with and hammered into place a 48 mm Pinnacle cup in 45 of abduction and 15 of anteversion. The cup seated nicely and required no supplemental screws. We then placed a central hole Eliminator and a 0 polyethylene liner. The foot was then externally rotated to 110, the HANA elevator was placed around the flare of the greater trochanter and the limb was extended and abducted delivering the proximal femur up into the wound. A medium Hohmann retractor was placed over the greater trochanter and a Mueller retractor  along the posterior femoral neck completing the exposure. We then performed releases superiorly and and inferiorly of the capsule going back to the pirformis fossa superiorly and to the lesser trochanter inferiorly. We then entered the proximal femur with the box cutting offset chisel followed by, a canal sounder, the chili pepper and broaching up to a 3 broach. This seated nicely and we reamed the calcar. A trial reduction was performed with a 1 mm 32 mm head.The limb lengths were excellent the hip was stable in 90 of external rotation. At this point the trial components removed and we hammered into place a # 3 std  Offset Tri-Lock stem with Gryption coating. A + 1 32 mm ceramic ball was then hammered into place the hip was reduced and final C-arm images obtained. The wound was thoroughly irrigated with normal saline solution. We repaired the ant capsule and the tensor fascia lot a with running 0 vicryl suture. the subcutaneous tissue was closed with 2-0 and 3-0 Vicryl suture followed by an Aquacil dressing. At this point the patient was awaken and transferred to hospital gurney without difficulty. The subcutaneous tissue with 0 and 2-0 undyed Vicryl suture and the skin with running  3-0 vicryl subcuticular suture. Aquacil dressing was applied. The patient was then unclamped, rolled supine, awaken extubated and taken to recovery room without difficulty in stable condition.   [name],[name] 06/06/2017, 1:27 PM

## 2017-06-06 NOTE — Anesthesia Preprocedure Evaluation (Addendum)
Anesthesia Evaluation  Patient identified by MRN, date of birth, ID band Patient awake    Reviewed: Allergy & Precautions, NPO status , Patient's Chart, lab work & pertinent test results  Airway Mallampati: II  TM Distance: >3 FB Neck ROM: Full    Dental no notable dental hx.    Pulmonary neg pulmonary ROS,    Pulmonary exam normal breath sounds clear to auscultation       Cardiovascular hypertension, Normal cardiovascular exam+ dysrhythmias + pacemaker  Rhythm:Regular Rate:Normal     Neuro/Psych negative neurological ROS  negative psych ROS   GI/Hepatic negative GI ROS, Neg liver ROS,   Endo/Other  negative endocrine ROS  Renal/GU Renal InsufficiencyRenal disease  negative genitourinary   Musculoskeletal negative musculoskeletal ROS (+)   Abdominal   Peds negative pediatric ROS (+)  Hematology negative hematology ROS (+)   Anesthesia Other Findings   Reproductive/Obstetrics negative OB ROS                             Anesthesia Physical Anesthesia Plan  ASA: III  Anesthesia Plan: General   Post-op Pain Management:    Induction: Intravenous  PONV Risk Score and Plan: 3 and Ondansetron, Dexamethasone and Treatment may vary due to age or medical condition  Airway Management Planned: Oral ETT  Additional Equipment:   Intra-op Plan:   Post-operative Plan:   Informed Consent: I have reviewed the patients History and Physical, chart, labs and discussed the procedure including the risks, benefits and alternatives for the proposed anesthesia with the patient or authorized representative who has indicated his/her understanding and acceptance.   Dental advisory given  Plan Discussed with: CRNA and Surgeon  Anesthesia Plan Comments: (Patient refuses SAB)       Anesthesia Quick Evaluation

## 2017-06-07 ENCOUNTER — Encounter (HOSPITAL_COMMUNITY): Payer: Self-pay | Admitting: Orthopedic Surgery

## 2017-06-07 LAB — CBC
HEMATOCRIT: 31.8 % — AB (ref 36.0–46.0)
HEMOGLOBIN: 10.8 g/dL — AB (ref 12.0–15.0)
MCH: 30.9 pg (ref 26.0–34.0)
MCHC: 34 g/dL (ref 30.0–36.0)
MCV: 91.1 fL (ref 78.0–100.0)
PLATELETS: 211 10*3/uL (ref 150–400)
RBC: 3.49 MIL/uL — AB (ref 3.87–5.11)
RDW: 13.9 % (ref 11.5–15.5)
WBC: 16.3 10*3/uL — AB (ref 4.0–10.5)

## 2017-06-07 LAB — BASIC METABOLIC PANEL
Anion gap: 8 (ref 5–15)
BUN: 31 mg/dL — AB (ref 6–20)
CHLORIDE: 105 mmol/L (ref 101–111)
CO2: 19 mmol/L — ABNORMAL LOW (ref 22–32)
CREATININE: 1.66 mg/dL — AB (ref 0.44–1.00)
Calcium: 8.2 mg/dL — ABNORMAL LOW (ref 8.9–10.3)
GFR, EST AFRICAN AMERICAN: 34 mL/min — AB (ref >60)
GFR, EST NON AFRICAN AMERICAN: 30 mL/min — AB (ref >60)
Glucose, Bld: 198 mg/dL — ABNORMAL HIGH (ref 65–99)
POTASSIUM: 5.2 mmol/L — AB (ref 3.5–5.1)
SODIUM: 132 mmol/L — AB (ref 135–145)

## 2017-06-07 NOTE — Discharge Summary (Signed)
Patient ID: [name] MRN: [id number] DOB/AGE: March 31, 1946 71 y.o.  Admit date: 06/06/2017 Discharge date: 06/07/2017  Admission Diagnoses:  Principal Problem:   Osteoarthritis of left hip Active Problems:   Primary osteoarthritis of left hip   Discharge Diagnoses:  Same  Past Medical History:  Diagnosis Date  . Arthritis    "qwhere" (06/06/2017)  . Chronic bilateral low back pain   . CKD (chronic kidney disease) stage 3, GFR 30-59 ml/min (HCC) 09/21/2012  . Complication of anesthesia    slow to wake up "once" (06/06/2017)  . DDD (degenerative disc disease), cervical   . Dysrhythmia   . Esophageal stricture   . GERD 10/04/2007   Qualifier: Diagnosis of  By: Linford Arnold MD, Santina Evans    . HIATAL HERNIA WITH REFLUX 10/04/2007   Qualifier: Diagnosis of  By: Linford Arnold MD, Santina Evans    . History of hiatal hernia   . History of kidney stones   . Hyperlipidemia 08/10/2010   Qualifier: Diagnosis of  By: Linford Arnold MD, Santina Evans    . Hypertension   . HYPERTENSION, BENIGN ESSENTIAL 04/27/2007   Qualifier: Diagnosis of  By: Linford Arnold MD, Santina Evans    . Impaired fasting glucose 08/10/2010   Qualifier: Diagnosis of  By: Linford Arnold MD, Santina Evans    . Lobar pneumonia (HCC)    "never missed a day of work w/it"  . 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 Laurier Nancy, [facility]   . Seborrheic dermatitis 06/12/2014    Surgeries: Procedure(s): TOTAL HIP ARTHROPLASTY ANTERIOR APPROACH on 06/06/2017   Consultants:   Discharged Condition: Improved  Hospital Course: [name] is an 71 y.o. female who was admitted 06/06/2017 for operative treatment ofOsteoarthritis of left hip. Patient has severe unremitting pain that affects sleep, daily activities, and work/hobbies. After pre-op clearance the patient was taken to the operating room on 06/06/2017 and underwent  Procedure(s): TOTAL HIP ARTHROPLASTY  ANTERIOR APPROACH.    Patient was given perioperative antibiotics:  Anti-infectives (From admission, onward)   Start     Dose/Rate Route Frequency Ordered Stop   06/07/17 2200  cephALEXin (KEFLEX) capsule 500 mg     500 mg Oral 2 times daily 06/06/17 1717     06/07/17 1830  ceFAZolin (ANCEF) IVPB 2g/100 mL premix     2 g 200 mL/hr over 30 Minutes Intravenous Every 6 hours 06/06/17 1717 06/08/17 0629   06/06/17 1200  ceFAZolin (ANCEF) IVPB 2g/100 mL premix     2 g 200 mL/hr over 30 Minutes Intravenous To [facility] 06/03/17 0835 06/06/17 1302       Patient was given sequential compression devices, early ambulation, and chemoprophylaxis to prevent DVT.  Patient benefited maximally from hospital stay and there were no complications.    Recent vital signs:  Patient Vitals for the past 24 hrs:  BP Temp Temp src Pulse Resp SpO2  06/07/17 0414 140/61 (!) 97.5 F (36.4 C) Axillary 93 16 98 %  06/07/17 0015 123/65 98.1 F (36.7 C) Oral (!) 106 14 99 %  06/06/17 2028 (!) 139/59 98.6 F (37 C) Oral (!) 102 16 100 %  06/06/17 1705 (!) 150/68 98.9 F (37.2 C) Oral (!) 103 16 96 %  06/06/17 1639 117/63 - - 89 12 99 %  06/06/17 1623 126/68 - - 86 12 100 %  06/06/17 1609 (!) 120/58 - - 91 14 100 %  06/06/17 1553 (!) 149/72 - -  94 18 100 %  06/06/17 1540 - (!) 97.2 F (36.2 C) - - - -  06/06/17 1538 (!) 137/58 - - 95 16 99 %  06/06/17 1523 (!) 144/65 - - 89 17 98 %  06/06/17 1509 (!) 141/60 - - 92 14 99 %  06/06/17 1453 132/63 - - 89 16 100 %  06/06/17 1445 (!) 123/51 - - 88 16 100 %  06/06/17 1438 (!) 126/51 - - 82 14 99 %  06/06/17 1424 127/66 - - 87 15 (!) 79 %  06/06/17 1409 (!) 133/97 98.4 F (36.9 C) - 86 16 98 %     Recent laboratory studies:  Recent Labs    06/07/17 0658 06/07/17 0929  WBC 16.3*  --   HGB 10.8*  --   HCT 31.8*  --   PLT 211  --   NA  --  132*  K  --  5.2*  CL  --  105  CO2  --  19*  BUN  --  31*  CREATININE  --  1.66*  GLUCOSE  --  198*   CALCIUM  --  8.2*     Discharge Medications:   Allergies as of 06/07/2017   No Known Allergies     Medication List    STOP taking these medications   HYDROcodone-acetaminophen 7.5-325 MG tablet Commonly known as:  NORCO     TAKE these medications   amLODipine 5 MG tablet Commonly known as:  NORVASC Take 1 tablet (5 mg total) by mouth daily.   aspirin EC 325 MG tablet Take 1 tablet (325 mg total) 2 (two) times daily by mouth.   carvedilol 6.25 MG tablet Commonly known as:  COREG Take 1 tablet (6.25 mg total) by mouth 2 (two) times daily with a meal.   celecoxib 200 MG capsule Commonly known as:  CELEBREX Take 200 mg by mouth 2 (two) times daily.   cephALEXin 500 MG capsule Commonly known as:  KEFLEX Take 500 mg by mouth 2 (two) times daily.   lisinopril-hydrochlorothiazide 20-12.5 MG tablet Commonly known as:  PRINZIDE,ZESTORETIC Take 1 tablet by mouth 2 (two) times daily.   oxyCODONE-acetaminophen 5-325 MG tablet Commonly known as:  ROXICET Take 1 tablet every 4 (four) hours as needed by mouth.   tiZANidine 2 MG tablet Commonly known as:  ZANAFLEX Take 1 tablet (2 mg total) every 6 (six) hours as needed by mouth for muscle spasms.            Durable Medical Equipment  (From admission, onward)        Start     Ordered   06/06/17 1718  DME Walker rolling  Once    Question:  Patient needs a walker to treat with the following condition  Answer:  Status post total hip replacement, left   06/06/17 1717   06/06/17 1718  DME 3 n 1  Once     06/06/17 1717   06/06/17 1718  DME Bedside commode  Once    Question:  Patient needs a bedside commode to treat with the following condition  Answer:  Status post total hip replacement, left   06/06/17 1717      Diagnostic Studies: Dg Chest 2 View  Result Date: 05/27/2017 CLINICAL DATA:  Preop chest x-ray for total hip arthroplasty. EXAM: CHEST  2 VIEW COMPARISON:  None. FINDINGS: Lordotic technique on the frontal  film demonstrates hypoinflated lungs without airspace consolidation or effusion. Cardiomediastinal silhouette is within normal. Left-sided  pacemaker intact. Mild degenerate change of the spine per IMPRESSION: Hypoinflation without acute cardiopulmonary disease. Electronically Signed   By: Elberta Fortisaniel  Boyle M.D.   On: 05/27/2017 16:34   Dg Hip Operative Unilat With Pelvis Left  Result Date: 06/06/2017 CLINICAL DATA:  71 year old female with left hip replacement. Initial encounter. EXAM: OPERATIVE left hip frontal projection 2  VIEWS TECHNIQUE: Fluoroscopic spot image(s) were submitted for interpretation post-operatively. COMPARISON:  None. FINDINGS: Post total left hip replacement which appears in satisfactory position on AP projection. Degenerative changes left sacroiliac joint and pubic symphysis. IMPRESSION: Post total left hip replacement which appears in satisfactory position on AP projection. Electronically Signed   By: Lacy DuverneySteven  Olson M.D.   On: 06/06/2017 13:44    Disposition:   Discharge Instructions    Call MD / Call 911   Complete by:  As directed    If you experience chest pain or shortness of breath, CALL 911 and be transported to the hospital emergency room.  If you develope a fever above 101 F, pus (white drainage) or increased drainage or redness at the wound, or calf pain, call your surgeon's office.   Constipation Prevention   Complete by:  As directed    Drink plenty of fluids.  Prune juice may be helpful.  You may use a stool softener, such as Colace (over the counter) 100 mg twice a day.  Use MiraLax (over the counter) for constipation as needed.   Diet - low sodium heart healthy   Complete by:  As directed    Driving restrictions   Complete by:  As directed    No driving for 2 weeks   Follow the hip precautions as taught in Physical Therapy   Complete by:  As directed    Increase activity slowly as tolerated   Complete by:  As directed    Patient may shower   Complete by:  As  directed    You may shower without a dressing once there is no drainage.  Do not wash over the wound.  If drainage remains, cover wound with plastic wrap and then shower.      Follow-up Information    Gean Birchwoodowan, Frank, MD Follow up in 2 week(s).   Specialty:  Orthopedic Surgery Contact information: [address] [place] [address] [phone]            Signed: Henry RusselHILLIPS, Jonathan Kirkendoll R 06/07/2017, 12:39 PM

## 2017-06-07 NOTE — Evaluation (Signed)
Physical Therapy Evaluation Patient Details Name: Samantha LandBetty P [name] MRN: [id number] DOB: 06/28/1946 Today's Date: 06/07/2017   History of Present Illness  Pt is a  71 y/o female who presents s/p L THA on 06/06/17. PMH signficiant for PPM, HTN, DDD, CKD, LBP, L TKA 2006 and revision 2007.  Clinical Impression  Pt is s/p THA resulting in the deficits listed below (see PT Problem List). At the time of PT eval pt was able to perform transfers and ambulation with gross min guard assist for balance support and safety with the RW. Pt was able to tolerate therapeutic exercise without complaints of increased pain. Pt will benefit from skilled PT to increase their independence and safety with mobility to allow discharge to the venue listed below.      Follow Up Recommendations DC plan and follow up therapy as arranged by surgeon;Home health PT    Equipment Recommendations  None recommended by PT    Recommendations for Other Services       Precautions / Restrictions Precautions Precautions: Fall Restrictions Weight Bearing Restrictions: Yes LLE Weight Bearing: Weight bearing as tolerated      Mobility  Bed Mobility               General bed mobility comments: Pt was received sitting up in recliner.   Transfers Overall transfer level: Needs assistance Equipment used: Rolling walker (2 wheeled) Transfers: Sit to/from Stand Sit to Stand: Min guard         General transfer comment: Hands-on guarding as pt powered-up to full standing position. VC's for hand placement on seated surface for safety.   Ambulation/Gait Ambulation/Gait assistance: Min guard Ambulation Distance (Feet): 250 Feet Assistive device: Rolling walker (2 wheeled) Gait Pattern/deviations: Step-through pattern;Decreased stride length;Trunk flexed Gait velocity: Decreased Gait velocity interpretation: Below normal speed for age/gender General Gait Details: Pt required 3 standing rest breaks due to fatigue and SOB,  however otherwise appeared very motivated for distance. Pt did not require any assistance to ambulate, however hands-on guarding provided throughout for safety. VC's for improved posture, increased heel strike on the L.   Stairs            Wheelchair Mobility    Modified Rankin (Stroke Patients Only)       Balance Overall balance assessment: Needs assistance Sitting-balance support: No upper extremity supported;Feet supported Sitting balance-Leahy Scale: Fair     Standing balance support: During functional activity;No upper extremity supported Standing balance-Leahy Scale: Poor Standing balance comment: Relies on UE support on walker for standing balance at this time.                              Pertinent Vitals/Pain Pain Assessment: 0-10 Pain Score: 0-No pain    Home Living Family/patient expects to be discharged to:: Private residence Living Arrangements: Alone Available Help at Discharge: Family;Available 24 hours/day Type of Home: House Home Access: Level entry     Home Layout: One level Home Equipment: Emergency planning/management officerhower seat;Walker - 2 wheels;Cane - single point;Bedside commode;Toilet riser;Grab bars - toilet;Grab bars - tub/shower      Prior Function Level of Independence: Independent with assistive device(s)         Comments: Used the walker the last 2 weeks prior to surgery      Hand Dominance   Dominant Hand: Right    Extremity/Trunk Assessment   Upper Extremity Assessment Upper Extremity Assessment: Overall WFL for tasks assessed  Lower Extremity Assessment Lower Extremity Assessment: LLE deficits/detail LLE Deficits / Details: Decreased strength and AROM consistent with above mentioned procedure.     Cervical / Trunk Assessment Cervical / Trunk Assessment: Normal  Communication   Communication: No difficulties  Cognition Arousal/Alertness: Awake/alert Behavior During Therapy: WFL for tasks assessed/performed Overall Cognitive  Status: Within Functional Limits for tasks assessed                                        General Comments      Exercises Total Joint Exercises Ankle Circles/Pumps: 10 reps Quad Sets: 10 reps Short Arc Quad: 10 reps Hip ABduction/ADduction: 10 reps   Assessment/Plan    PT Assessment Patient needs continued PT services  PT Problem List Decreased strength;Decreased range of motion;Decreased activity tolerance;Decreased balance;Decreased mobility;Decreased knowledge of use of DME;Decreased safety awareness;Decreased knowledge of precautions;Pain       PT Treatment Interventions DME instruction;Gait training;Stair training;Functional mobility training;Therapeutic activities;Therapeutic exercise;Neuromuscular re-education;Patient/family education    PT Goals (Current goals can be found in the Care Plan section)  Acute Rehab PT Goals Patient Stated Goal: Home possibly today PT Goal Formulation: With patient Time For Goal Achievement: 06/14/17 Potential to Achieve Goals: Good    Frequency 7X/week   Barriers to discharge        Co-evaluation               AM-PAC PT "6 Clicks" Daily Activity  Outcome Measure Difficulty turning over in bed (including adjusting bedclothes, sheets and blankets)?: None Difficulty moving from lying on back to sitting on the side of the bed? : A Little Difficulty sitting down on and standing up from a chair with arms (e.g., wheelchair, bedside commode, etc,.)?: A Little Help needed moving to and from a bed to chair (including a wheelchair)?: A Little Help needed walking in hospital room?: A Little Help needed climbing 3-5 steps with a railing? : A Little 6 Click Score: 19    End of Session Equipment Utilized During Treatment: Gait belt Activity Tolerance: Patient tolerated treatment well Patient left: in chair;with call bell/phone within reach Nurse Communication: Mobility status PT Visit Diagnosis: Unsteadiness on feet  (R26.81);Pain;Difficulty in walking, not elsewhere classified (R26.2) Pain - Right/Left: Left Pain - part of body: Leg    Time: [id number] PT Time Calculation (min) (ACUTE ONLY): 32 min   Charges:   PT Evaluation $PT Eval Moderate Complexity: 1 Mod PT Treatments $Gait Training: 8-22 mins   PT G Codes:        [name], PT, DPT Acute Rehabilitation Services Pager: [phone]   [name] 06/07/2017, 9:20 AM

## 2017-06-07 NOTE — Care Management Note (Signed)
Case Management Note  Patient Details  Name: [name] MRN: [id number] Date of Birth: 11/11/1945  Subjective/Objective:   71 yr old female s/p left total hip arthroplasty.                 Action/Plan: Case manager spoke with patient concerning discharge plan and DME.  Patient was preoperatively setup with [facility], no changes, she has RW and 3in1. Will have family support at discharge.      Expected Discharge Date:  06/07/17               Expected Discharge Plan:  Home w Home Health Services  In-House Referral:     Discharge planning Services  CM Consult  Post Acute Care Choice:  Home Health Choice offered to:  Patient  DME Arranged:  N/A(has RW and 3in1) DME Agency:  NA  HH Arranged:  PT HH Agency:  [facility] ([facility]  Status of Service:  Completed, signed off  If discussed at [employer] Meetings, dates discussed:    Additional Comments:  Durenda GuthrieBrady, Dorella Laster Naomi, RN 06/07/2017, 2:58 PM

## 2017-06-07 NOTE — Anesthesia Postprocedure Evaluation (Addendum)
Anesthesia Post Note  Patient: [name]  Procedure(s) Performed: TOTAL HIP ARTHROPLASTY ANTERIOR APPROACH (Left )     Patient location during evaluation: PACU Anesthesia Type: General Level of consciousness: awake and alert Pain management: pain level controlled Vital Signs Assessment: post-procedure vital signs reviewed and stable Respiratory status: spontaneous breathing, nonlabored ventilation, respiratory function stable and patient connected to nasal cannula oxygen Cardiovascular status: blood pressure returned to baseline and stable Postop Assessment: no apparent nausea or vomiting Anesthetic complications: no    Last Vitals:  Vitals:   06/07/17 0015 06/07/17 0414  BP: 123/65 140/61  Pulse: (!) 106 93  Resp: 14 16  Temp: 36.7 C (!) 36.4 C  SpO2: 99% 98%    Last Pain:  Vitals:   06/07/17 0809  TempSrc:   PainSc: 0-No pain                 Maili Shutters S

## 2017-06-07 NOTE — Progress Notes (Signed)
Physical Therapy Treatment Patient Details Name: Samantha LandBetty P Souter MRN: [id number] DOB: 06/24/1946 Today's Date: 06/07/2017    History of Present Illness Pt is a  71 y/o female who presents s/p L THA on 06/06/17. PMH signficiant for PPM, HTN, DDD, CKD, LBP, L TKA 2006 and revision 2007.    PT Comments    Pt progressing towards physical therapy goals. Was able to perform transfers and ambulation with gross min guard assist to supervision for safety. Pt anticipates d/c home this afternoon. Feel this is reasonable as pt is no longer requiring physical assist for mobility. If still here tomorrow, will see for a morning session prior to d/c.    Follow Up Recommendations  DC plan and follow up therapy as arranged by surgeon;Home health PT     Equipment Recommendations  None recommended by PT    Recommendations for Other Services       Precautions / Restrictions Precautions Precautions: Fall Restrictions Weight Bearing Restrictions: Yes LLE Weight Bearing: Weight bearing as tolerated    Mobility  Bed Mobility               General bed mobility comments: Pt was received sitting up in recliner.   Transfers Overall transfer level: Needs assistance Equipment used: Rolling walker (2 wheeled) Transfers: Sit to/from Stand Sit to Stand: Supervision         General transfer comment: Supervision for safety as pt powered-up to full standing position. Pt demonstrated proper hand placement on seated surface for safety.   Ambulation/Gait Ambulation/Gait assistance: Min guard;Supervision Ambulation Distance (Feet): 250 Feet Assistive device: Rolling walker (2 wheeled) Gait Pattern/deviations: Step-through pattern;Decreased stride length;Trunk flexed Gait velocity: Decreased Gait velocity interpretation: Below normal speed for age/gender General Gait Details: Pt required 2 standing rest breaks due to fatigue and SOB, however otherwise appeared very motivated for distance. Pt did not  require any assistance to ambulate, and close guard or supervision provided throughout for safety. VC's for improved posture, increased heel strike on the L.    Stairs            Wheelchair Mobility    Modified Rankin (Stroke Patients Only)       Balance Overall balance assessment: Needs assistance Sitting-balance support: No upper extremity supported;Feet supported Sitting balance-Leahy Scale: Fair     Standing balance support: During functional activity;No upper extremity supported Standing balance-Leahy Scale: Fair Standing balance comment: Static standing                            Cognition Arousal/Alertness: Awake/alert Behavior During Therapy: WFL for tasks assessed/performed Overall Cognitive Status: Within Functional Limits for tasks assessed                                        Exercises Total Joint Exercises Heel Slides: 10 reps Hip ABduction/ADduction: 10 reps Long Arc Quad: 10 reps    General Comments        Pertinent Vitals/Pain Pain Assessment: 0-10 Pain Score: 0-No pain    Home Living                      Prior Function            PT Goals (current goals can now be found in the care plan section) Acute Rehab PT Goals Patient Stated Goal: Home this afternoon  PT Goal Formulation: With patient Time For Goal Achievement: 06/14/17 Potential to Achieve Goals: Good Progress towards PT goals: Progressing toward goals    Frequency    7X/week      PT Plan Current plan remains appropriate    Co-evaluation              AM-PAC PT "6 Clicks" Daily Activity  Outcome Measure  Difficulty turning over in bed (including adjusting bedclothes, sheets and blankets)?: None Difficulty moving from lying on back to sitting on the side of the bed? : A Little Difficulty sitting down on and standing up from a chair with arms (e.g., wheelchair, bedside commode, etc,.)?: A Little Help needed moving to and  from a bed to chair (including a wheelchair)?: A Little Help needed walking in hospital room?: A Little Help needed climbing 3-5 steps with a railing? : A Little 6 Click Score: 19    End of Session Equipment Utilized During Treatment: Gait belt Activity Tolerance: Patient tolerated treatment well Patient left: in chair;with call bell/phone within reach Nurse Communication: Mobility status PT Visit Diagnosis: Unsteadiness on feet (R26.81);Pain;Difficulty in walking, not elsewhere classified (R26.2) Pain - Right/Left: Left Pain - part of body: Leg     Time: 1111-1140 PT Time Calculation (min) (ACUTE ONLY): 29 min  Charges:  $Gait Training: 23-37 mins                    G Codes:       Conni SlipperLaura Elowyn Raupp, PT, DPT Acute Rehabilitation Services Pager: [phone]    Marylynn PearsonLaura D Aydee Mcnew 06/07/2017, 1:14 PM

## 2017-06-07 NOTE — Progress Notes (Signed)
Discharge teaching complete. Meds, diet, activity, follow up appointments, incisional care reviewed and all questions answered. Copy of instructions and prescriptions given to patient. Patient discharged home via wheelchair with daughter.

## 2017-06-07 NOTE — Progress Notes (Signed)
PATIENT ID: [name]  MRN: [id number]  DOB/AGE:  11/08/1945 / 71 y.o.  1 Day Post-Op Procedure(s) (LRB): TOTAL HIP ARTHROPLASTY ANTERIOR APPROACH (Left)    PROGRESS NOTE Subjective: Patient is alert, oriented, no Nausea, no Vomiting, yes passing gas, . Taking PO well. Denies SOB, Chest or Calf Pain. Using Incentive Spirometer, PAS in place. Ambulate walked in room Patient reports pain as  2/10  .    Objective: Vital signs in last 24 hours: Vitals:   06/06/17 1705 06/06/17 2028 06/07/17 0015 06/07/17 0414  BP: (!) 150/68 (!) 139/59 123/65 140/61  Pulse: (!) 103 (!) 102 (!) 106 93  Resp: 16 16 14 16   Temp: 98.9 F (37.2 C) 98.6 F (37 C) 98.1 F (36.7 C) (!) 97.5 F (36.4 C)  TempSrc: Oral Oral Oral Axillary  SpO2: 96% 100% 99% 98%  Weight:      Height:          Intake/Output from previous day: I/O last 3 completed shifts: In: 3065 [P.O.:240; I.V.:2825] Out: 350 [Blood:350]   Intake/Output this shift: No intake/output data recorded.   LABORATORY DATA: No results for input(s): WBC, HGB, HCT, PLT, NA, K, CL, CO2, BUN, CREATININE, GLUCOSE, GLUCAP, INR, CALCIUM in the last 72 hours.  Invalid input(s): PT, 2  Examination: Neurologically intact ABD soft Neurovascular intact Sensation intact distally Intact pulses distally Dorsiflexion/Plantar flexion intact Incision: no drainage No cellulitis present Compartment soft} XR AP&Lat of hip shows well placed\fixed THA  Assessment:   1 Day Post-Op Procedure(s) (LRB): TOTAL HIP ARTHROPLASTY ANTERIOR APPROACH (Left) ADDITIONAL DIAGNOSIS:  Expected Acute Blood Loss Anemia,   Plan: PT/OT WBAT, THA  DVT Prophylaxis: SCDx72 hrs, ASA 325 mg BID x 2 weeks  DISCHARGE PLAN: Home, today if passes PT  DISCHARGE NEEDS: HHPT, Walker and 3-in-1 comode seatPatient ID: [name], female   DOB: 10/26/1945, 71 y.o.   MRN: [id number]

## 2017-06-29 ENCOUNTER — Encounter: Payer: Self-pay | Admitting: Cardiology

## 2017-08-10 ENCOUNTER — Encounter: Payer: Medicare Other | Admitting: Cardiology

## 2017-10-27 ENCOUNTER — Emergency Department (HOSPITAL_BASED_OUTPATIENT_CLINIC_OR_DEPARTMENT_OTHER)
Admission: EM | Admit: 2017-10-27 | Discharge: 2017-10-27 | Disposition: A | Discharge status: Home / Self Care | Payer: Medicare Other | Attending: Emergency Medicine | Admitting: Emergency Medicine

## 2017-10-27 ENCOUNTER — Emergency Department (HOSPITAL_BASED_OUTPATIENT_CLINIC_OR_DEPARTMENT_OTHER): Payer: Medicare Other

## 2017-10-27 ENCOUNTER — Other Ambulatory Visit: Payer: Self-pay

## 2017-10-27 ENCOUNTER — Encounter (HOSPITAL_BASED_OUTPATIENT_CLINIC_OR_DEPARTMENT_OTHER): Payer: Self-pay | Admitting: Emergency Medicine

## 2017-10-27 DIAGNOSIS — N183 Chronic kidney disease, stage 3 (moderate): Secondary | ICD-10-CM | POA: Diagnosis not present

## 2017-10-27 DIAGNOSIS — G501 Atypical facial pain: Secondary | ICD-10-CM | POA: Diagnosis not present

## 2017-10-27 DIAGNOSIS — R110 Nausea: Secondary | ICD-10-CM | POA: Diagnosis not present

## 2017-10-27 DIAGNOSIS — Z79899 Other long term (current) drug therapy: Secondary | ICD-10-CM | POA: Diagnosis not present

## 2017-10-27 DIAGNOSIS — R0600 Dyspnea, unspecified: Secondary | ICD-10-CM | POA: Diagnosis not present

## 2017-10-27 DIAGNOSIS — R070 Pain in throat: Secondary | ICD-10-CM | POA: Diagnosis not present

## 2017-10-27 DIAGNOSIS — I129 Hypertensive chronic kidney disease with stage 1 through stage 4 chronic kidney disease, or unspecified chronic kidney disease: Secondary | ICD-10-CM | POA: Insufficient documentation

## 2017-10-27 DIAGNOSIS — R05 Cough: Secondary | ICD-10-CM | POA: Insufficient documentation

## 2017-10-27 DIAGNOSIS — Z96642 Presence of left artificial hip joint: Secondary | ICD-10-CM | POA: Diagnosis not present

## 2017-10-27 DIAGNOSIS — J189 Pneumonia, unspecified organism: Secondary | ICD-10-CM | POA: Insufficient documentation

## 2017-10-27 DIAGNOSIS — R0981 Nasal congestion: Secondary | ICD-10-CM | POA: Insufficient documentation

## 2017-10-27 DIAGNOSIS — R0602 Shortness of breath: Secondary | ICD-10-CM | POA: Diagnosis present

## 2017-10-27 DIAGNOSIS — H9209 Otalgia, unspecified ear: Secondary | ICD-10-CM | POA: Diagnosis not present

## 2017-10-27 DIAGNOSIS — Z96652 Presence of left artificial knee joint: Secondary | ICD-10-CM | POA: Insufficient documentation

## 2017-10-27 DIAGNOSIS — Z950 Presence of cardiac pacemaker: Secondary | ICD-10-CM | POA: Diagnosis not present

## 2017-10-27 LAB — BASIC METABOLIC PANEL
ANION GAP: 10 (ref 5–15)
BUN: 45 mg/dL — ABNORMAL HIGH (ref 6–20)
CALCIUM: 8.3 mg/dL — AB (ref 8.9–10.3)
CO2: 19 mmol/L — ABNORMAL LOW (ref 22–32)
Chloride: 107 mmol/L (ref 101–111)
Creatinine, Ser: 2.16 mg/dL — ABNORMAL HIGH (ref 0.44–1.00)
GFR, EST AFRICAN AMERICAN: 25 mL/min — AB (ref >60)
GFR, EST NON AFRICAN AMERICAN: 22 mL/min — AB (ref >60)
GLUCOSE: 154 mg/dL — AB (ref 65–99)
Potassium: 4.3 mmol/L (ref 3.5–5.1)
SODIUM: 136 mmol/L (ref 135–145)

## 2017-10-27 LAB — CBC WITH DIFFERENTIAL/PLATELET
BASOS ABS: 0 10*3/uL (ref 0.0–0.1)
Basophils Relative: 0 %
EOS ABS: 0 10*3/uL (ref 0.0–0.7)
EOS PCT: 0 %
HCT: 39.7 % (ref 36.0–46.0)
Hemoglobin: 13.2 g/dL (ref 12.0–15.0)
Lymphocytes Relative: 20 %
Lymphs Abs: 2.9 10*3/uL (ref 0.7–4.0)
MCH: 29.4 pg (ref 26.0–34.0)
MCHC: 33.2 g/dL (ref 30.0–36.0)
MCV: 88.4 fL (ref 78.0–100.0)
MONO ABS: 1.3 10*3/uL — AB (ref 0.1–1.0)
Monocytes Relative: 9 %
Neutro Abs: 10.8 10*3/uL — ABNORMAL HIGH (ref 1.7–7.7)
Neutrophils Relative %: 71 %
PLATELETS: 252 10*3/uL (ref 150–400)
RBC: 4.49 MIL/uL (ref 3.87–5.11)
RDW: 14.5 % (ref 11.5–15.5)
WBC: 15 10*3/uL — ABNORMAL HIGH (ref 4.0–10.5)

## 2017-10-27 LAB — TROPONIN I

## 2017-10-27 MED ORDER — IPRATROPIUM-ALBUTEROL 0.5-2.5 (3) MG/3ML IN SOLN: 3.0000 mL | Freq: Four times a day (QID) | RESPIRATORY_TRACT | Status: DC

## 2017-10-27 MED ORDER — IPRATROPIUM-ALBUTEROL 0.5-2.5 (3) MG/3ML IN SOLN
RESPIRATORY_TRACT | Status: AC
Start: 2017-10-27 — End: 2017-10-27
  Administered 2017-10-27: 3 mL via RESPIRATORY_TRACT
  Filled 2017-10-27: qty 3

## 2017-10-27 MED ORDER — ALBUTEROL SULFATE (2.5 MG/3ML) 0.083% IN NEBU
INHALATION_SOLUTION | RESPIRATORY_TRACT | Status: AC
  Administered 2017-10-27: 2.5 mg via RESPIRATORY_TRACT
  Filled 2017-10-27: qty 3

## 2017-10-27 MED ORDER — ALBUTEROL SULFATE HFA 108 (90 BASE) MCG/ACT IN AERS
2.0000 | INHALATION_SPRAY | RESPIRATORY_TRACT | Status: DC | PRN
  Administered 2017-10-27: 2 via RESPIRATORY_TRACT
  Filled 2017-10-27: qty 6.7

## 2017-10-27 MED ORDER — IPRATROPIUM-ALBUTEROL 0.5-2.5 (3) MG/3ML IN SOLN
3.0000 mL | Freq: Four times a day (QID) | RESPIRATORY_TRACT | Status: AC
  Administered 2017-10-27: 3 mL via RESPIRATORY_TRACT

## 2017-10-27 MED ORDER — ALBUTEROL SULFATE (2.5 MG/3ML) 0.083% IN NEBU
2.5000 mg | INHALATION_SOLUTION | Freq: Once | RESPIRATORY_TRACT | Status: AC
  Administered 2017-10-27: 2.5 mg via RESPIRATORY_TRACT

## 2017-10-27 MED ORDER — IPRATROPIUM BROMIDE 0.02 % IN SOLN: 0.5000 mg | Freq: Once | RESPIRATORY_TRACT | Status: DC

## 2017-10-27 MED ORDER — SODIUM CHLORIDE 0.9 % IV BOLUS
1000.0000 mL | Freq: Once | INTRAVENOUS | Status: AC
  Administered 2017-10-27: 1000 mL via INTRAVENOUS

## 2017-10-27 MED ORDER — LEVOFLOXACIN 750 MG PO TABS: 750.0000 mg | ORAL_TABLET | ORAL | 0 refills | Status: DC

## 2017-10-27 MED ORDER — PREDNISONE 20 MG PO TABS: 40.0000 mg | ORAL_TABLET | Freq: Every day | ORAL | 0 refills | Status: DC

## 2017-10-27 MED ORDER — PREDNISONE 50 MG PO TABS
60.0000 mg | ORAL_TABLET | Freq: Once | ORAL | Status: AC
  Administered 2017-10-27: 60 mg via ORAL
  Filled 2017-10-27: qty 1

## 2017-10-27 MED ORDER — LEVOFLOXACIN 750 MG PO TABS
750.0000 mg | ORAL_TABLET | Freq: Once | ORAL | Status: AC
  Administered 2017-10-27: 750 mg via ORAL
  Filled 2017-10-27: qty 1

## 2017-10-27 MED FILL — levoFLOXacin 750 MG TABS: 750 | 8 days supply | Qty: 4 | Fill #0

## 2017-10-27 MED FILL — predniSONE 20 MG TABS: 20 | 5 days supply | Qty: 10 | Fill #0

## 2017-10-27 NOTE — Discharge Instructions (Addendum)
You will take the antibiotic every other day.  Make sure you return immediately if you become more short of breath, high fevers, not feeling any better in 2-3 days

## 2017-10-27 NOTE — ED Provider Notes (Signed)
[facility] [facility] EMERGENCY DEPARTMENT Provider Note   CSN: [id number] Arrival date & time: 10/27/17  0848     History   Chief Complaint Chief Complaint  Patient presents with  . URI  . Shortness of Breath    HPI [name] is a 72 y.o. female.  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  It started with congestion, facial pain, sore throat and cough.  However she continues to feel worse and not better.  She is also starting to notice some exertional dyspnea.  The history is provided by the patient and a relative.  URI   This is a new problem. Episode onset: 5 days ago. The problem has been gradually worsening. Associated symptoms include nausea, congestion, ear pain, headaches, plugged ear sensation, rhinorrhea, sore throat and cough. Pertinent negatives include no chest pain. Associated symptoms comments: Exertional dyspnea and chills.  No leg swelling. She has tried nothing for the symptoms. The treatment provided no relief.  Shortness of Breath  Associated symptoms include headaches, rhinorrhea, sore throat, ear pain and cough. Pertinent negatives include no chest pain.    Past Medical History:  Diagnosis Date  . Arthritis    "qwhere" (06/06/2017)  . Chronic bilateral low back pain   . CKD (chronic kidney disease) stage 3, GFR 30-59 ml/min (HCC) 09/21/2012  . Complication of anesthesia    slow to wake up "once" (06/06/2017)  . DDD (degenerative disc disease), cervical   . Dysrhythmia   . Esophageal stricture   . GERD 10/04/2007   Qualifier: Diagnosis of  By: Linford Arnold MD, Santina Evans    . HIATAL HERNIA WITH REFLUX 10/04/2007   Qualifier: Diagnosis of  By: Linford Arnold MD,  Santina Evans    . History of hiatal hernia   . History of kidney stones   . Hyperlipidemia 08/10/2010   Qualifier: Diagnosis of  By: Linford Arnold MD, Santina Evans    . Hypertension   . HYPERTENSION, BENIGN ESSENTIAL 04/27/2007   Qualifier: Diagnosis of  By: Linford Arnold MD, Santina Evans    . Impaired fasting glucose 08/10/2010   Qualifier: Diagnosis of  By: Linford Arnold MD, Santina Evans    . Lobar pneumonia (HCC)    "never missed a day of work w/it"  . 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 Laurier Nancy, [facility]   . Seborrheic dermatitis 06/12/2014    Patient Active Problem List   Diagnosis Date Noted  . Primary osteoarthritis of left hip 06/06/2017  . Osteoarthritis of left hip 06/03/2017  . Preoperative cardiovascular examination 05/30/2017  . Seborrheic dermatitis 06/12/2014  . Pacemaker 09/21/2012  . CKD (chronic kidney disease) stage 3, GFR 30-59 ml/min (HCC) 09/21/2012  . Hyperlipidemia 08/10/2010  . Impaired fasting glucose 08/10/2010  . GERD 10/04/2007  . HIATAL HERNIA WITH REFLUX 10/04/2007  . 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 ABDOMINAL HYSTERECTOMY    . BACK SURGERY    . BLADDER SUSPENSION    . CARPAL TUNNEL RELEASE Bilateral   . CYSTOSCOPY W/ STONE MANIPULATION    . DILATION AND CURETTAGE OF UTERUS    .  ESOPHAGOGASTRODUODENOSCOPY (EGD) WITH ESOPHAGEAL DILATION  X 1  . GUM SURGERY     ramus frame implant lower gum  . INSERT / REPLACE / REMOVE PACEMAKER  08/2012  . JOINT REPLACEMENT    . LUMBAR DISC SURGERY  X 2  . TARSAL TUNNEL RELEASE Bilateral   . TONSILLECTOMY    . TOTAL HIP ARTHROPLASTY Left 06/06/2017  . TOTAL HIP ARTHROPLASTY Left 06/06/2017   Procedure: TOTAL HIP ARTHROPLASTY ANTERIOR APPROACH;  Surgeon: [name], MD;  Location: [facility] OR;  Service: Orthopedics;  Laterality: Left;  . TOTAL  KNEE ARTHROPLASTY Left 2006  . TOTAL KNEE REVISION Left 2007   new hardware  due to staph infection, on chronic keflex  . TRANSTHORACIC ECHOCARDIOGRAM     08/14/12 [facility]): NL LVF, no segmental abnormality, borderline LVH, mild MR, mild MR, trace TR     OB History   None      Home Medications    Prior to Admission medications   Medication Sig Start Date End Date Taking? Authorizing Provider  celecoxib (CELEBREX) 200 MG capsule Take 200 mg by mouth 2 (two) times daily.  12/08/16   [provider]  cephALEXin (KEFLEX) 500 MG capsule Take 500 mg by mouth 2 (two) times daily.    [provider]  HYDROcodone-acetaminophen (NORCO) 7.5-325 MG tablet Take 1 tablet by mouth 3 (three) times daily as needed. 10/08/17   [provider]  lisinopril-hydrochlorothiazide (PRINZIDE,ZESTORETIC) 20-12.5 MG tablet Take 1 tablet by mouth 2 (two) times daily. 12/13/16   Agapito Games, MD  tiZANidine (ZANAFLEX) 2 MG tablet Take 1 tablet (2 mg total) every 6 (six) hours as needed by mouth for muscle spasms. 06/06/17   Allena Katz, PA-C    Family History Family History  Problem Relation Age of Onset  . Heart attack Mother   . Diabetes Mother 26  . Hypertension Mother   . Hypertension Father     Social History Social History   Tobacco Use  . Smoking status: Never Smoker  . Smokeless tobacco: Never Used  Substance Use Topics  . Alcohol use: No  . Drug use: No     Allergies   Patient has no known allergies.   Review of Systems Review of Systems  HENT: Positive for congestion, ear pain, rhinorrhea and sore throat.   Respiratory: Positive for cough and shortness of breath.   Cardiovascular: Negative for chest pain.  Gastrointestinal: Positive for nausea.  Neurological: Positive for headaches.  All other systems reviewed and are negative.    Physical Exam Updated Vital Signs BP 131/74 (BP Location: Right Arm)   Pulse (!) 109   Temp 99.1  F (37.3 C) (Oral)   Resp (!) 24   Ht 5\' 2"  (1.575 m)   Wt 89.8 kg (198 lb)   SpO2 98%   BMI 36.21 kg/m   Physical Exam  Constitutional: She is oriented to person, place, and time. She appears well-developed and well-nourished. No distress.  HENT:  Head: Normocephalic and atraumatic.  Right Ear: Tympanic membrane normal.  Left Ear: Tympanic membrane normal.  Nose: Mucosal edema and rhinorrhea present.  Mouth/Throat: Posterior oropharyngeal erythema present. No oropharyngeal exudate or posterior oropharyngeal edema.  Eyes: Pupils are equal, round, and reactive to light. Conjunctivae and EOM are normal.  Neck: Normal range of motion. Neck supple.  Cardiovascular: Normal rate, regular rhythm and intact distal pulses.  No murmur heard. Pulmonary/Chest: Effort normal and breath sounds normal. Tachypnea noted. No respiratory distress.  She has no wheezes. She has no rales.  Just completed neb and has good air movement throughout without wheezing  Abdominal: Soft. She exhibits no distension. There is no tenderness. There is no rebound and no guarding.  Musculoskeletal: Normal range of motion. She exhibits no edema or tenderness.  Neurological: She is alert and oriented to person, place, and time.  Skin: Skin is warm and dry. No rash noted. No erythema.  Psychiatric: She has a normal mood and affect. Her behavior is normal.  Nursing note and vitals reviewed.    ED Treatments / Results  Labs (all labs ordered are listed, but only abnormal results are displayed) Labs Reviewed  CBC WITH DIFFERENTIAL/PLATELET - Abnormal; Notable for the following components:      Result Value   WBC 15.0 (*)    Neutro Abs 10.8 (*)    Monocytes Absolute 1.3 (*)    All other components within normal limits  BASIC METABOLIC PANEL - Abnormal; Notable for the following components:   CO2 19 (*)    Glucose, Bld 154 (*)    BUN 45 (*)    Creatinine, Ser 2.16 (*)    Calcium 8.3 (*)    GFR calc non Af Amer 22  (*)    GFR calc Af Amer 25 (*)    All other components within normal limits  TROPONIN I    EKG EKG Interpretation  Date/Time:  Thursday October 27 2017 09:01:42 EDT Ventricular Rate:  104 PR Interval:    QRS Duration: 134 QT Interval:  364 QTC Calculation: 479 R Axis:   -58 Text Interpretation:  Atrial-sensed ventricular-paced rhythm No further analysis attempted due to paced rhythm Baseline wander in lead(s) II III aVL aVF V3 No significant change since last tracing Confirmed by Gwyneth Sprout ([id number]) on 10/27/2017 9:35:22 AM   Radiology Dg Chest 2 View  Result Date: 10/27/2017 CLINICAL DATA:  Cough, fever, chest congestion, and shortness of breath for the past 5 days. Nonsmoker. EXAM: CHEST - 2 VIEW COMPARISON:  None in PACs FINDINGS: The lungs are adequately inflated. The lung markings are coarse in the infrahilar regions. There is no pleural effusion. The heart and pulmonary vascularity are normal. The ICD is in stable position. The bony structures exhibit no acute abnormalities. IMPRESSION: Coarse infrahilar lung markings posteriorly may reflect atelectasis or early pneumonia. Followup PA and lateral chest X-ray is recommended in 3-4 weeks following trial of antibiotic therapy to ensure resolution and exclude underlying malignancy. Electronically Signed   By: David  [place] M.D.   On: 10/27/2017 09:39    Procedures Procedures (including critical care time)  Medications Ordered in ED Medications  sodium chloride 0.9 % bolus 1,000 mL (1,000 mLs Intravenous New Bag/Given 10/27/17 1038)  levofloxacin (LEVAQUIN) tablet 750 mg (has no administration in time range)  predniSONE (DELTASONE) tablet 60 mg (has no administration in time range)  albuterol (PROVENTIL HFA;VENTOLIN HFA) 108 (90 Base) MCG/ACT inhaler 2 puff (has no administration in time range)  albuterol (PROVENTIL) (2.5 MG/3ML) 0.083% nebulizer solution 2.5 mg (2.5 mg Nebulization Given 10/27/17 0910)  ipratropium-albuterol  (DUONEB) 0.5-2.5 (3) MG/3ML nebulizer solution 3 mL (3 mLs Nebulization Given 10/27/17 0910)     Initial Impression / Assessment and Plan / ED Course  I have reviewed the triage vital signs and the nursing notes.  Pertinent labs & imaging results that were available during my care of the patient were reviewed by me and considered in my medical decision making (see chart for details).  Elderly female with multiple medical problems presenting today with 5 days of URI symptoms which are worsening.  Productive cough, chills and exertional dyspnea.  Patient does have a prior history of pneumonia.  Currently oxygenating 98% on room air.  After one nebulizer treatment patient is moving air well and states that she feels better.  Low suspicion that this is CHF or cardiac related.  EKG shows a paced rhythm but no other acute findings.  Chest x-ray today shows concern for possible early pneumonia versus atelectasis.  CBC, BMP, troponin pending  11:02 AM Labs are consistent with mild AK I with creatinine of 2.14 from her baseline of 1.4.  Leukocytosis of 15,000 and normal troponin.  Findings discussed with the patient and she still appears mildly short of breath with oxygen saturation of 93-95% on room air.  Offered admission but patient insisted that she would be going home.  Patient is taking Keflex daily and will add Levaquin.  Based on renal function she gets 750 mg every 48 hours.  Also given prednisone and an inhaler for inflammation.  Discussed with patient and her family member a low threshold for return.  Patient seems to understand the risk and is willing to return if things get worse. Final Clinical Impressions(s) / ED Diagnoses   Final diagnoses:  Community acquired pneumonia, unspecified laterality    ED Discharge Orders        Ordered    levofloxacin (LEVAQUIN) 750 MG tablet  Every 48 hours     10/27/17 1106    predniSONE (DELTASONE) 20 MG tablet  Daily     10/27/17 1106         Gwyneth SproutPlunkett, [place] Strieter, MD 10/27/17 1107

## 2017-10-27 NOTE — ED Notes (Signed)
NAD at this time. Pt is stable and going home.  

## 2017-10-27 NOTE — ED Triage Notes (Signed)
Pt having some URI symptoms for one week.  Pt has baseline sob but feels worse lately, especially with movement.  No acute respiratory distress noted.

## 2017-11-07 ENCOUNTER — Other Ambulatory Visit: Payer: Self-pay | Admitting: Orthopedic Surgery

## 2017-11-18 NOTE — Pre-Procedure Instructions (Signed)
Marylene LandBetty P Asaro  11/18/2017      [facility] [phone] - [place], [place] - [address] [facility] [address] [place] [id number] Phone: [phone] Fax: [phone]    Your procedure is scheduled on Wednesday May 1.  [facility] 10:45 [facility].M.  Call this number if you have problems the morning of surgery:  [phone]   Remember:  Do not eat food or drink liquids after midnight.  Take these medicines the morning of surgery with A SIP OF WATER:   Hydrocodone-acetaminophen (Norco) if needed Tizanidine (Zanaflex) if needed  7 days prior to surgery STOP taking any Aspirin(unless otherwise instructed by your surgeon), Aleve, Naproxen, Ibuprofen, Motrin, Advil, Goody's, BC's, all herbal medications, fish oil, and all vitamins    Do not wear jewelry, make-up or nail polish.  Do not wear lotions, powders, or perfumes, or deodorant.  Do not shave 48 hours prior to surgery.  Men may shave face and neck.  Do not bring valuables to the hospital.  [facility] is not responsible for any belongings or valuables.  Contacts, dentures or bridgework may not be worn into surgery.  Leave your suitcase in the car.  After surgery it may be brought to your room.  For patients admitted to the hospital, discharge time will be determined by your treatment team.  Patients discharged the day of surgery will not be allowed to drive home.   Special instructions:    [facility]- Preparing For Surgery  Before surgery, you can play an important role. Because skin is not sterile, your skin needs to be as free of germs as possible. You can reduce the number of germs on your skin by washing with CHG (chlorahexidine gluconate) Soap before surgery.  CHG is an antiseptic cleaner which kills germs and bonds with the skin to continue killing germs even after washing.  Please do not use if you have an allergy to CHG or antibacterial soaps.  If your skin becomes reddened/irritated stop using the CHG.  Do not shave (including legs and underarms) for at least 48 hours prior to first CHG shower. It is OK to shave your face.  Please follow these instructions carefully.   1. Shower the NIGHT BEFORE SURGERY and the MORNING OF SURGERY with CHG.   2. If you chose to wash your hair, wash your hair first as usual with your normal shampoo.  3. After you shampoo, rinse your hair and body thoroughly to remove the shampoo.  4. Use CHG as you would any other liquid soap. You can apply CHG directly to the skin and wash gently with a scrungie or a clean washcloth.   5. Apply the CHG Soap to your body ONLY FROM THE NECK DOWN.  Do not use on open wounds or open sores. Avoid contact with your eyes, ears, mouth and genitals (private parts). Wash Face and genitals (private parts)  with your normal soap.  6. Wash thoroughly, paying special attention to the area where your surgery will be performed.  7. Thoroughly rinse your body with warm water from the neck down.  8. DO NOT shower/wash with your normal soap after using and rinsing off the CHG Soap.  9. Pat yourself dry with a CLEAN TOWEL.  10. Wear CLEAN PAJAMAS to bed the night before surgery, wear comfortable clothes the morning of surgery  11. Place CLEAN SHEETS on your bed  the night of your first shower and DO NOT SLEEP WITH PETS.    Day of Surgery: Do not apply any deodorants/lotions. Please wear clean clothes to the hospital/surgery center.      Please read over the following fact sheets that you were given. Coughing and Deep Breathing, Total Joint Packet, MRSA Information and Surgical Site Infection Prevention

## 2017-11-21 ENCOUNTER — Encounter (HOSPITAL_COMMUNITY)
Admission: RE | Admit: 2017-11-21 | Discharge: 2017-11-21 | Disposition: A | Discharge status: Home / Self Care | Payer: Medicare Other | Source: Ambulatory Visit | Attending: Orthopedic Surgery | Admitting: Orthopedic Surgery

## 2017-11-21 ENCOUNTER — Encounter (HOSPITAL_COMMUNITY): Payer: Self-pay

## 2017-11-21 DIAGNOSIS — I499 Cardiac arrhythmia, unspecified: Secondary | ICD-10-CM | POA: Insufficient documentation

## 2017-11-21 DIAGNOSIS — N183 Chronic kidney disease, stage 3 (moderate): Secondary | ICD-10-CM | POA: Insufficient documentation

## 2017-11-21 DIAGNOSIS — M1711 Unilateral primary osteoarthritis, right knee: Secondary | ICD-10-CM | POA: Diagnosis not present

## 2017-11-21 DIAGNOSIS — Z01812 Encounter for preprocedural laboratory examination: Secondary | ICD-10-CM | POA: Insufficient documentation

## 2017-11-21 DIAGNOSIS — Z950 Presence of cardiac pacemaker: Secondary | ICD-10-CM | POA: Insufficient documentation

## 2017-11-21 DIAGNOSIS — I129 Hypertensive chronic kidney disease with stage 1 through stage 4 chronic kidney disease, or unspecified chronic kidney disease: Secondary | ICD-10-CM | POA: Insufficient documentation

## 2017-11-21 DIAGNOSIS — E785 Hyperlipidemia, unspecified: Secondary | ICD-10-CM | POA: Insufficient documentation

## 2017-11-21 LAB — SURGICAL PCR SCREEN
MRSA, PCR: NEGATIVE
Staphylococcus aureus: NEGATIVE

## 2017-11-21 LAB — URINALYSIS, ROUTINE W REFLEX MICROSCOPIC
Bilirubin Urine: NEGATIVE
Glucose, UA: NEGATIVE mg/dL
Hgb urine dipstick: NEGATIVE
Ketones, ur: NEGATIVE mg/dL
Leukocytes, UA: NEGATIVE
Nitrite: NEGATIVE
Protein, ur: NEGATIVE mg/dL
SPECIFIC GRAVITY, URINE: 1.017 (ref 1.005–1.030)
pH: 6 (ref 5.0–8.0)

## 2017-11-21 LAB — CBC WITH DIFFERENTIAL/PLATELET
BASOS ABS: 0 10*3/uL (ref 0.0–0.1)
Basophils Relative: 0 %
Eosinophils Absolute: 0.5 10*3/uL (ref 0.0–0.7)
Eosinophils Relative: 8 %
HEMATOCRIT: 39.1 % (ref 36.0–46.0)
Hemoglobin: 12.7 g/dL (ref 12.0–15.0)
LYMPHS PCT: 28 %
Lymphs Abs: 1.8 10*3/uL (ref 0.7–4.0)
MCH: 29.4 pg (ref 26.0–34.0)
MCHC: 32.5 g/dL (ref 30.0–36.0)
MCV: 90.5 fL (ref 78.0–100.0)
Monocytes Absolute: 0.5 10*3/uL (ref 0.1–1.0)
Monocytes Relative: 7 %
NEUTROS ABS: 3.8 10*3/uL (ref 1.7–7.7)
NEUTROS PCT: 57 %
PLATELETS: 297 10*3/uL (ref 150–400)
RBC: 4.32 MIL/uL (ref 3.87–5.11)
RDW: 14.4 % (ref 11.5–15.5)
WBC: 6.6 10*3/uL (ref 4.0–10.5)

## 2017-11-21 LAB — BASIC METABOLIC PANEL
ANION GAP: 11 (ref 5–15)
BUN: 26 mg/dL — ABNORMAL HIGH (ref 6–20)
CO2: 23 mmol/L (ref 22–32)
Calcium: 9.2 mg/dL (ref 8.9–10.3)
Chloride: 105 mmol/L (ref 101–111)
Creatinine, Ser: 1.5 mg/dL — ABNORMAL HIGH (ref 0.44–1.00)
GFR calc Af Amer: 39 mL/min — ABNORMAL LOW (ref >60)
GFR, EST NON AFRICAN AMERICAN: 34 mL/min — AB (ref >60)
Glucose, Bld: 120 mg/dL — ABNORMAL HIGH (ref 65–99)
POTASSIUM: 4.6 mmol/L (ref 3.5–5.1)
Sodium: 139 mmol/L (ref 135–145)

## 2017-11-21 LAB — TYPE AND SCREEN
ABO/RH(D): O POS
ANTIBODY SCREEN: NEGATIVE

## 2017-11-21 LAB — PROTIME-INR
INR: 0.99
Prothrombin Time: 13 seconds (ref 11.4–15.2)

## 2017-11-21 LAB — APTT: APTT: 30 s (ref 24–36)

## 2017-11-22 NOTE — Progress Notes (Addendum)
Anesthesia Chart Review:   Case:  [id number] Date/Time:  11/30/17 1230   Procedure:  TOTAL KNEE ARTHROPLASTY (Right )   Anesthesia type:  Spinal   Pre-op diagnosis:  RIGHT KNEE OSTEOARTHRITIS   Location:  [facility]OM 07 / [facility]   Surgeon:  [name], MD      Addendum 11/29/17:  Pt saw Dr. Dulce Sellar 11/29/17 and was cleared for surgery provided device check by industry 11/29/17 showed device interrogation is stable.  He also documents "Intraoperatively she should yet use a smart magnet as she appears pacemaker dependent". Note by Azalia Bilis, RN 11/29/17 states device was checked.    Addendum  11/23/17:  Pt does not follow with a device clinic for pacemaker.  Cardiologist Dr. Dulce Sellar recommends pt establish with device clinic and have device checked prior to surgery. Olegario Messier in Dr. Wadie Lessen office notified.    DISCUSSION: Pt is a 72 year old female with pacemaker (Medtronic) for complete heart block. Pt tolerated L THA 06/06/17 without issue. Never smoker.    VS: BP (!) 170/70   Temp 36.7 C   Resp 20   Ht 5' 2.5" (1.588 m)   Wt 196 lb (88.9 kg)   SpO2 99%   BMI 35.28 kg/m    PROVIDERS: Agapito Games, MD Patient Care Team: [name], MD as Consulting Physician (Orthopedic Surgery) Cardiologist is Norman Herrlich, MD. Last office visit 05/31/17; 6 month f/u recommended.    LABS: Labs reviewed: Acceptable for surgery. (all labs ordered are listed, but only abnormal results are displayed)  Labs Reviewed  BASIC METABOLIC PANEL - Abnormal; Notable for the following components:      Result Value   Glucose, Bld 120 (*)    BUN 26 (*)    Creatinine, Ser 1.50 (*)    GFR calc non Af Amer 34 (*)    GFR calc Af Amer 39 (*)    All other components within normal limits  SURGICAL PCR SCREEN  APTT  CBC WITH DIFFERENTIAL/PLATELET  PROTIME-INR  URINALYSIS, ROUTINE W REFLEX MICROSCOPIC  TYPE AND SCREEN     IMAGES:  CXR 10/27/17:  - Coarse infrahilar lung markings posteriorly may  reflect atelectasis or early pneumonia. Followup PA and lateral chest X-ray is recommended in 3-4 weeks following trial of antibiotic therapy to ensure resolution and exclude underlying malignancy   EKG 10/27/17: Atrial-sensed ventricular-paced rhythm. Baseline wander in lead(s) II III aVL aVF V3   CV:  Echo 08/14/12 (correspondence 06/08/17 in media tab):  - Normal LV systolic function, estimated EF 65%, no appreciable segmental abnormality, borderline concentric LVH, mild MR, trace TR.   Perioperative prescription form for pacemaker pending.   Past Medical History:  Diagnosis Date  . Arthritis    "qwhere" (06/06/2017)  . Chronic bilateral low back pain   . CKD (chronic kidney disease) stage 3, GFR 30-59 ml/min (HCC) 09/21/2012  . Complication of anesthesia    slow to wake up "once" (06/06/2017)  . DDD (degenerative disc disease), cervical   . Dysrhythmia   . Esophageal stricture   . GERD 10/04/2007   Qualifier: Diagnosis of  By: Linford Arnold MD, Santina Evans    . HIATAL HERNIA WITH REFLUX 10/04/2007   Qualifier: Diagnosis of  By: Linford Arnold MD, Santina Evans    . History of hiatal hernia   . History of kidney stones   . Hyperlipidemia 08/10/2010   Qualifier: Diagnosis of  By: Linford Arnold MD, Santina Evans    . Hypertension   . HYPERTENSION, BENIGN ESSENTIAL 04/27/2007  Qualifier: Diagnosis of  By: Linford ArnoldMetheney MD, Santina Evansatherine    . Impaired fasting glucose 08/10/2010   Qualifier: Diagnosis of  By: Linford ArnoldMetheney MD, Santina Evansatherine    . Lobar pneumonia (HCC)    "never missed a day of work w/it"  . 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 Laurier NancyZann Tyson, [facility]   . Seborrheic dermatitis 06/12/2014    Past Surgical History:  Procedure Laterality Date  . ABDOMINAL HYSTERECTOMY    . BACK SURGERY    . BLADDER SUSPENSION    . CARPAL TUNNEL RELEASE Bilateral   . CYSTOSCOPY W/ STONE MANIPULATION    . DILATION AND CURETTAGE OF  UTERUS    . ESOPHAGOGASTRODUODENOSCOPY (EGD) WITH ESOPHAGEAL DILATION  X 1  . GUM SURGERY     ramus frame implant lower gum  . INSERT / REPLACE / REMOVE PACEMAKER  08/2012  . JOINT REPLACEMENT    . LUMBAR DISC SURGERY  X 2  . TARSAL TUNNEL RELEASE Bilateral   . TONSILLECTOMY    . TOTAL HIP ARTHROPLASTY Left 06/06/2017  . TOTAL HIP ARTHROPLASTY Left 06/06/2017   Procedure: TOTAL HIP ARTHROPLASTY ANTERIOR APPROACH;  Surgeon: Gean Birchwoodowan, Frank, MD;  Location: [facility];  Service: Orthopedics;  Laterality: Left;  . TOTAL KNEE ARTHROPLASTY Left 2006  . TOTAL KNEE REVISION Left 2007   new hardware  due to staph infection, on chronic keflex  . TRANSTHORACIC ECHOCARDIOGRAM     08/14/12 [facility]): NL LVF, no segmental abnormality, borderline LVH, mild MR, mild MR, trace TR    MEDICATIONS: . celecoxib (CELEBREX) 200 MG capsule  . cephALEXin (KEFLEX) 500 MG capsule  . guaiFENesin (MUCINEX) 600 MG 12 hr tablet  . HYDROcodone-acetaminophen (NORCO) 7.5-325 MG tablet  . levofloxacin (LEVAQUIN) 750 MG tablet  . lisinopril-hydrochlorothiazide (PRINZIDE,ZESTORETIC) 20-12.5 MG tablet  . predniSONE (DELTASONE) 20 MG tablet  . tiZANidine (ZANAFLEX) 2 MG tablet   No current facility-administered medications for this encounter.     If no changes, I anticipate pt can proceed with surgery as scheduled.   Rica Mastngela Diontay Rosencrans, FNP-BC [facility]iology Phone: [phone] 11/29/2017 4:36 PM

## 2017-11-23 ENCOUNTER — Encounter: Payer: Self-pay | Admitting: Cardiology

## 2017-11-23 ENCOUNTER — Ambulatory Visit (INDEPENDENT_AMBULATORY_CARE_PROVIDER_SITE_OTHER): Payer: Medicare Other | Admitting: Family Medicine

## 2017-11-23 ENCOUNTER — Encounter: Payer: Self-pay | Admitting: Family Medicine

## 2017-11-23 VITALS — BP 138/64 | HR 76 | Ht 63.0 in | Wt 195.0 lb

## 2017-11-23 DIAGNOSIS — I10 Essential (primary) hypertension: Secondary | ICD-10-CM

## 2017-11-23 DIAGNOSIS — R7301 Impaired fasting glucose: Secondary | ICD-10-CM | POA: Diagnosis not present

## 2017-11-23 DIAGNOSIS — M1711 Unilateral primary osteoarthritis, right knee: Secondary | ICD-10-CM | POA: Diagnosis not present

## 2017-11-23 LAB — POCT GLYCOSYLATED HEMOGLOBIN (HGB A1C): Hemoglobin A1C: 6.2

## 2017-11-23 MED ORDER — AMLODIPINE BESYLATE 10 MG PO TABS: 10.0000 mg | ORAL_TABLET | Freq: Every day | ORAL | 1 refills | Status: DC

## 2017-11-23 MED ORDER — LISINOPRIL-HYDROCHLOROTHIAZIDE 20-12.5 MG PO TABS: 1.0000 | ORAL_TABLET | Freq: Two times a day (BID) | ORAL | 1 refills | Status: DC

## 2017-11-23 NOTE — Progress Notes (Signed)
   Subjective:    Patient ID: [name], female    DOB: 12/18/1945, 72 y.o.   MRN: [id number]  HPI 72 year old female comes in today with a history of high blood pressure reporting that when she went for her preop physical for right knee surgery her blood pressure was in the 170s.  They encouraged her to follow-up with her PCP to have this managed before surgery.  Currently taking amlodipine 5 mg daily and lisinopril HCT.  Impaired fasting glucose-no increased thirst or urination. No symptoms consistent with hypoglycemia.   She feels her blood pressures up because this is been a particularly stressful year for her.  She is had a lot of stressful family events and personal events.  She had a hip surgery that did not go well and now is having knee problems.  Her daughter was in a really bad car accident.  Review of Systems     Objective:   Physical Exam  Constitutional: She is oriented to person, place, and time. She appears well-developed and well-nourished.  HENT:  Head: Normocephalic and atraumatic.  Cardiovascular: Normal rate, regular rhythm and normal heart sounds.  Pulmonary/Chest: Effort normal and breath sounds normal.  Neurological: She is alert and oriented to person, place, and time.  Skin: Skin is warm and dry.  Psychiatric: She has a normal mood and affect. Her behavior is normal.        Assessment & Plan:  HTN - will increase amlodipine to 10mg .  Hopefully this will helps. Refilled meds today.  Recheck BP in 2 weeks with Nurse visit.    IFG -hemoglobin A1c 6.2 today which is up from previous a year ago.  Continue to work on healthy diet and staying active which hopefully will improve after she has her knee replacement.  Follow-up in 6 months.  Right knee OA - schedule for surgery on May 1st.  Discussed the importance of getting her blood pressure under control before surgery.  Acute distress-gave encouragement.

## 2017-11-23 NOTE — Patient Instructions (Signed)
DASH Eating Plan DASH stands for "Dietary Approaches to Stop Hypertension." The DASH eating plan is a healthy eating plan that has been shown to reduce high blood pressure (hypertension). It may also reduce your risk for type 2 diabetes, heart disease, and stroke. The DASH eating plan may also help with weight loss. What are tips for following this plan? General guidelines  Avoid eating more than 2,300 mg (milligrams) of salt (sodium) a day. If you have hypertension, you may need to reduce your sodium intake to 1,500 mg a day.  Limit alcohol intake to no more than 1 drink a day for nonpregnant women and 2 drinks a day for men. One drink equals 12 oz of beer, 5 oz of wine, or 1 oz of hard liquor.  Work with your health care provider to maintain a healthy body weight or to lose weight. Ask what an ideal weight is for you.  Get at least 30 minutes of exercise that causes your heart to beat faster (aerobic exercise) most days of the week. Activities may include walking, swimming, or biking.  Work with your health care provider or diet and nutrition specialist (dietitian) to adjust your eating plan to your individual calorie needs. Reading food labels  Check food labels for the amount of sodium per serving. Choose foods with less than 5 percent of the Daily Value of sodium. Generally, foods with less than 300 mg of sodium per serving fit into this eating plan.  To find whole grains, look for the word "whole" as the first word in the ingredient list. Shopping  Buy products labeled as "low-sodium" or "no salt added."  Buy fresh foods. Avoid canned foods and premade or frozen meals. Cooking  Avoid adding salt when cooking. Use salt-free seasonings or herbs instead of table salt or sea salt. Check with your health care provider or pharmacist before using salt substitutes.  Do not fry foods. Cook foods using healthy methods such as baking, boiling, grilling, and broiling instead.  Cook with  heart-healthy oils, such as olive, canola, soybean, or sunflower oil. Meal planning   Eat a balanced diet that includes: ? 5 or more servings of fruits and vegetables each day. At each meal, try to fill half of your plate with fruits and vegetables. ? Up to 6-8 servings of whole grains each day. ? Less than 6 oz of lean meat, poultry, or fish each day. A 3-oz serving of meat is about the same size as a deck of cards. One egg equals 1 oz. ? 2 servings of low-fat dairy each day. ? A serving of nuts, seeds, or beans 5 times each week. ? Heart-healthy fats. Healthy fats called Omega-3 fatty acids are found in foods such as flaxseeds and coldwater fish, like sardines, salmon, and mackerel.  Limit how much you eat of the following: ? Canned or prepackaged foods. ? Food that is high in trans fat, such as fried foods. ? Food that is high in saturated fat, such as fatty meat. ? Sweets, desserts, sugary drinks, and other foods with added sugar. ? Full-fat dairy products.  Do not salt foods before eating.  Try to eat at least 2 vegetarian meals each week.  Eat more home-cooked food and less restaurant, buffet, and fast food.  When eating at a restaurant, ask that your food be prepared with less salt or no salt, if possible. What foods are recommended? The items listed may not be a complete list. Talk with your dietitian about what   dietary choices are best for you. Grains Whole-grain or whole-wheat bread. Whole-grain or whole-wheat pasta. Brown rice. Oatmeal. Quinoa. Bulgur. Whole-grain and low-sodium cereals. Pita bread. Low-fat, low-sodium crackers. Whole-wheat flour tortillas. Vegetables Fresh or frozen vegetables (raw, steamed, roasted, or grilled). Low-sodium or reduced-sodium tomato and vegetable juice. Low-sodium or reduced-sodium tomato sauce and tomato paste. Low-sodium or reduced-sodium canned vegetables. Fruits All fresh, dried, or frozen fruit. Canned fruit in natural juice (without  added sugar). Meat and other protein foods Skinless chicken or turkey. Ground chicken or turkey. Pork with fat trimmed off. Fish and seafood. Egg whites. Dried beans, peas, or lentils. Unsalted nuts, nut butters, and seeds. Unsalted canned beans. Lean cuts of beef with fat trimmed off. Low-sodium, lean deli meat. Dairy Low-fat (1%) or fat-free (skim) milk. Fat-free, low-fat, or reduced-fat cheeses. Nonfat, low-sodium ricotta or cottage cheese. Low-fat or nonfat yogurt. Low-fat, low-sodium cheese. Fats and oils Soft margarine without trans fats. Vegetable oil. Low-fat, reduced-fat, or light mayonnaise and salad dressings (reduced-sodium). Canola, safflower, olive, soybean, and sunflower oils. Avocado. Seasoning and other foods Herbs. Spices. Seasoning mixes without salt. Unsalted popcorn and pretzels. Fat-free sweets. What foods are not recommended? The items listed may not be a complete list. Talk with your dietitian about what dietary choices are best for you. Grains Baked goods made with fat, such as croissants, muffins, or some breads. Dry pasta or rice meal packs. Vegetables Creamed or fried vegetables. Vegetables in a cheese sauce. Regular canned vegetables (not low-sodium or reduced-sodium). Regular canned tomato sauce and paste (not low-sodium or reduced-sodium). Regular tomato and vegetable juice (not low-sodium or reduced-sodium). Pickles. Olives. Fruits Canned fruit in a light or heavy syrup. Fried fruit. Fruit in cream or butter sauce. Meat and other protein foods Fatty cuts of meat. Ribs. Fried meat. Bacon. Sausage. Bologna and other processed lunch meats. Salami. Fatback. Hotdogs. Bratwurst. Salted nuts and seeds. Canned beans with added salt. Canned or smoked fish. Whole eggs or egg yolks. Chicken or turkey with skin. Dairy Whole or 2% milk, cream, and half-and-half. Whole or full-fat cream cheese. Whole-fat or sweetened yogurt. Full-fat cheese. Nondairy creamers. Whipped toppings.  Processed cheese and cheese spreads. Fats and oils Butter. Stick margarine. Lard. Shortening. Ghee. Bacon fat. Tropical oils, such as coconut, palm kernel, or palm oil. Seasoning and other foods Salted popcorn and pretzels. Onion salt, garlic salt, seasoned salt, table salt, and sea salt. Worcestershire sauce. Tartar sauce. Barbecue sauce. Teriyaki sauce. Soy sauce, including reduced-sodium. Steak sauce. Canned and packaged gravies. Fish sauce. Oyster sauce. Cocktail sauce. Horseradish that you find on the shelf. Ketchup. Mustard. Meat flavorings and tenderizers. Bouillon cubes. Hot sauce and Tabasco sauce. Premade or packaged marinades. Premade or packaged taco seasonings. Relishes. Regular salad dressings. Where to find more information:  National Heart, Lung, and Blood Institute: www.nhlbi.nih.gov  American Heart Association: www.heart.org Summary  The DASH eating plan is a healthy eating plan that has been shown to reduce high blood pressure (hypertension). It may also reduce your risk for type 2 diabetes, heart disease, and stroke.  With the DASH eating plan, you should limit salt (sodium) intake to 2,300 mg a day. If you have hypertension, you may need to reduce your sodium intake to 1,500 mg a day.  When on the DASH eating plan, aim to eat more fresh fruits and vegetables, whole grains, lean proteins, low-fat dairy, and heart-healthy fats.  Work with your health care provider or diet and nutrition specialist (dietitian) to adjust your eating plan to your individual   calorie needs. This information is not intended to replace advice given to you by your health care provider. Make sure you discuss any questions you have with your health care provider. Document Released: 07/08/2011 Document Revised: 07/12/2016 Document Reviewed: 07/12/2016 Elsevier Interactive Patient Education  2018 Elsevier Inc.  

## 2017-11-28 NOTE — Progress Notes (Signed)
Cardiology Office Note:    Date:  11/29/2017   ID:  [name], DOB 07/13/1946, MRN [id number]  PCP:  Agapito Games, MD  Cardiologist:  Norman Herrlich, MD    Referring MD: Agapito [name], *    ASSESSMENT:    1. Preop cardiovascular exam   2. Pacemaker   3. 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  The cardiac status is stable. The planned procedure is intermediate risk. The functional capacity is uncertain due to limitation from knee pain Recent cardiac tests performed EKG today stable paced rhythm Given the above his overall risk for the planned procedure is low acceptable, her device be interrogated this afternoon in our pacemaker clinic.  Antiplatelet/ anticoagulant recommendation: None Other cardiac medication or device recommendation: Smart magnet intraoperatively Anesthesia recommendation: None Observation, monitoring,and postoperative test recommendation: Telemetry bed first 24 hours postoperatively The patient is optimized from a cardiology perspective: Yes provider her device interrogation is stable today   In order of problems listed above:  2.        Stable it is been greater than 6 months since her last device check and she will be seen today in our pacemaker clinic.  There is no indication of pacemaker malfunction.  Intraoperatively she should yet use a smart magnet as she appears pacemaker dependent 3.        Stable hypertension continue current treatment   Next appointment: 6 months   Medication Adjustments/Labs and Tests Ordered: Current medicines are reviewed at length with the patient today.  Concerns regarding medicines are outlined above.  Orders Placed This Encounter  Procedures  . EKG 12-Lead   No orders of the defined types  were placed in this encounter.   Chief Complaint  Patient presents with  . Pre-op Exam    per Dr [name] prior to right knee surgery    History of Present Illness:    [name] is a 72 y.o. 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 Adapta Pacemaker-08/14/2012 - Implanted  Inventory item:Model/Cat no.:  Serial no.:Manufacturer: MEDTRONIC CARDIOVASCULAR  Compliance with diet, lifestyle and medications: Yes She is a good result from initial total knee arthroplasty now has severe pain and limitation near the knee.  Due to scheduling she was missed in our pacemaker device clinic for enrollment.  She will be checked quickly today her last pacemaker check in October was stable and she has no indication of dysfunction.  She has no chest pain shortness of breath palpitation or syncope. Past Medical History:  Diagnosis Date  . Arthritis    "qwhere" (06/06/2017)  . Chronic bilateral low back pain   . CKD (chronic kidney disease) stage 3, GFR 30-59 ml/min (HCC) 09/21/2012  . Complication of anesthesia    slow to wake up "once" (06/06/2017)  . DDD (degenerative disc disease), cervical   . Dysrhythmia   . Esophageal stricture   . GERD 10/04/2007   Qualifier: Diagnosis of  By: Linford Arnold MD, Santina Evans    . HIATAL HERNIA WITH REFLUX 10/04/2007   Qualifier: Diagnosis of  By: Linford Arnold MD, Santina Evans    . History of hiatal hernia   . History of kidney stones   . Hyperlipidemia 08/10/2010   Qualifier: Diagnosis of  By: Linford Arnold MD, Santina Evans    .  Hypertension   . HYPERTENSION, BENIGN ESSENTIAL 04/27/2007   Qualifier: Diagnosis of  By: Linford Arnold MD, Santina Evans    . Impaired fasting glucose 08/10/2010   Qualifier: Diagnosis of  By: Linford Arnold MD, Santina Evans    . Lobar pneumonia (HCC)    "never missed a day of work w/it"  . Other specified cardiac arrhythmias 01/02/2015  . Presence of permanent cardiac pacemaker    Medtronic dual chamber pacemaker 08/14/12 (for  complete heart block with syncope) Dr. Laurier Nancy, [facility]   . Seborrheic dermatitis 06/12/2014    Past Surgical History:  Procedure Laterality Date  . ABDOMINAL HYSTERECTOMY    . BACK SURGERY    . BLADDER SUSPENSION    . CARPAL TUNNEL RELEASE Bilateral   . CYSTOSCOPY W/ STONE MANIPULATION    . DILATION AND CURETTAGE OF UTERUS    . ESOPHAGOGASTRODUODENOSCOPY (EGD) WITH ESOPHAGEAL DILATION  X 1  . GUM SURGERY     ramus frame implant lower gum  . INSERT / REPLACE / REMOVE PACEMAKER  08/2012  . JOINT REPLACEMENT    . LUMBAR DISC SURGERY  X 2  . TARSAL TUNNEL RELEASE Bilateral   . TONSILLECTOMY    . TOTAL HIP ARTHROPLASTY Left 06/06/2017  . TOTAL HIP ARTHROPLASTY Left 06/06/2017   Procedure: TOTAL HIP ARTHROPLASTY ANTERIOR APPROACH;  Surgeon: [name], MD;  Location: [facility];  Service: Orthopedics;  Laterality: Left;  . TOTAL KNEE ARTHROPLASTY Left 2006  . TOTAL KNEE REVISION Left 2007   new hardware  due to staph infection, on chronic keflex  . TRANSTHORACIC ECHOCARDIOGRAM     08/14/12 [facility]): NL LVF, no segmental abnormality, borderline LVH, mild MR, mild MR, trace TR    Current Medications: Current Meds  Medication Sig  . amLODipine (NORVASC) 10 MG tablet Take 1 tablet (10 mg total) by mouth daily.  . cephALEXin (KEFLEX) 500 MG capsule Take 500 mg by mouth 2 (two) times daily.  Marland Kitchen lisinopril-hydrochlorothiazide (PRINZIDE,ZESTORETIC) 20-12.5 MG tablet Take 1 tablet by mouth 2 (two) times daily.     Allergies:   Patient has no known allergies.   Social History   Socioeconomic History  . Marital status: Widowed    Spouse name: Not on file  . Number of children: Not on file  . Years of education: Not on file  . Highest education level: Not on file  Occupational History  . Not on file  Social Needs  . Financial resource strain: Not on file  . Food insecurity:    Worry: Not on file    Inability: Not on file  . Transportation needs:     Medical: Not on file    Non-medical: Not on file  Tobacco Use  . Smoking status: Never Smoker  . Smokeless tobacco: Never Used  Substance and Sexual Activity  . Alcohol use: No  . Drug use: No  . Sexual activity: Not on file  Lifestyle  . Physical activity:    Days per week: Not on file    Minutes per session: Not on file  . Stress: Not on file  Relationships  . Social connections:    Talks on phone: Not on file    Gets together: Not on file    Attends religious service: Not on file    Active member of club or organization: Not on file    Attends meetings of clubs or organizations: Not on file    Relationship status: Not on file  Other Topics Concern  .  Not on file  Social History Narrative  . Not on file     Family History: The patient's family history includes Diabetes (age of onset: 62) in her mother; Heart attack in her mother; Hypertension in her father and mother. ROS:   Please see the history of present illness.    All other systems reviewed and are negative.  EKGs/Labs/Other Studies Reviewed:    The following studies were reviewed today:  EKG:  EKG ordered today.  The ekg ordered today demonstrates ventricular paced rhythm dual-chamber with APCs  Recent Labs: 11/21/2017: BUN 26; Creatinine, Ser 1.50; Hemoglobin 12.7; Platelets 297; Potassium 4.6; Sodium 139  Recent Lipid Panel    Component Value Date/Time   CHOL 160 11/14/2013 1121   TRIG 144 11/14/2013 1121   HDL 45 11/14/2013 1121   CHOLHDL 3.6 11/14/2013 1121   VLDL 29 11/14/2013 1121   LDLCALC 86 11/14/2013 1121    Physical Exam:    VS:  BP (!) 112/56 (BP Location: Right Arm, Patient Position: Sitting, Cuff Size: Large)   Pulse 84   Ht  (1.6 m)   Wt 195 lb (88.5 kg)   SpO2 98%   BMI 34.54 kg/m     Wt Readings from Last 3 Encounters:  11/29/17 195 lb (88.5 kg)  11/23/17 195 lb (88.5 kg)  11/21/17 196 lb (88.9 kg)     GEN:  Well nourished, well developed in no acute distress HEENT:  Normal NECK: No JVD; No carotid bruits LYMPHATICS: No lymphadenopathy CARDIAC: RRR, no murmurs, rubs, gallops RESPIRATORY:  Clear to auscultation without rales, wheezing or rhonchi  ABDOMEN: Soft, non-tender, non-distended MUSCULOSKELETAL:  No edema; No deformity  SKIN: Warm and dry NEUROLOGIC:  Alert and oriented x 3 PSYCHIATRIC:  Normal affect    Signed, Norman Herrlich, MD  11/29/2017 10:39 AM    [facility]

## 2017-11-29 ENCOUNTER — Ambulatory Visit (INDEPENDENT_AMBULATORY_CARE_PROVIDER_SITE_OTHER): Payer: Medicare Other | Admitting: *Deleted

## 2017-11-29 ENCOUNTER — Ambulatory Visit: Payer: Medicare Other | Admitting: Cardiology

## 2017-11-29 ENCOUNTER — Encounter: Payer: Self-pay | Admitting: Cardiology

## 2017-11-29 VITALS — BP 112/56 | HR 84 | Ht 63.0 in | Wt 195.0 lb

## 2017-11-29 DIAGNOSIS — I129 Hypertensive chronic kidney disease with stage 1 through stage 4 chronic kidney disease, or unspecified chronic kidney disease: Secondary | ICD-10-CM | POA: Diagnosis not present

## 2017-11-29 DIAGNOSIS — Z950 Presence of cardiac pacemaker: Secondary | ICD-10-CM

## 2017-11-29 DIAGNOSIS — M1711 Unilateral primary osteoarthritis, right knee: Secondary | ICD-10-CM | POA: Diagnosis present

## 2017-11-29 DIAGNOSIS — I495 Sick sinus syndrome: Secondary | ICD-10-CM

## 2017-11-29 DIAGNOSIS — Z01810 Encounter for preprocedural cardiovascular examination: Secondary | ICD-10-CM | POA: Diagnosis not present

## 2017-11-29 MED ORDER — BUPIVACAINE LIPOSOME 1.3 % IJ SUSP
20.0000 mL | Freq: Once | INTRAMUSCULAR | Status: AC
  Administered 2017-11-30: 20 mL
  Filled 2017-11-29: qty 20

## 2017-11-29 MED ORDER — TRANEXAMIC ACID 1000 MG/10ML IV SOLN
1000.0000 mg | INTRAVENOUS | Status: AC
  Administered 2017-11-30: 1000 mg via INTRAVENOUS
  Filled 2017-11-29: qty 1100

## 2017-11-29 MED ORDER — TRANEXAMIC ACID 1000 MG/10ML IV SOLN
2000.0000 mg | INTRAVENOUS | Status: AC
  Administered 2017-11-30: 2000 mg via TOPICAL
  Filled 2017-11-29: qty 20

## 2017-11-29 NOTE — Progress Notes (Signed)
Device checked for cardiac clearance by industry d/t pt not having established with EP. pt to f/u with WC in [place] on 01/25/18 to establish.

## 2017-11-29 NOTE — H&P (Signed)
TOTAL KNEE ADMISSION H&P  Patient is being admitted for right total knee arthroplasty.  Subjective:  Chief Complaint:right knee pain.  HPI: [name], 72 y.o. female, has a history of pain and functional disability in the right knee due to arthritis and has failed non-surgical conservative treatments for greater than 12 weeks to includeNSAID's and/or analgesics, corticosteriod injections, viscosupplementation injections, use of assistive devices, weight reduction as appropriate and activity modification.  Onset of symptoms was gradual, starting several years ago with gradually worsening course since that time. The patient noted no past surgery on the right knee(s).  Patient currently rates pain in the right knee(s) at 10 out of 10 with activity. Patient has night pain, worsening of pain with activity and weight bearing, pain that interferes with activities of daily living, pain with passive range of motion, crepitus and joint swelling.  Patient has evidence of subchondral sclerosis, periarticular osteophytes and joint space narrowing by imaging studies.   There is no active infection.  Patient Active Problem List   Diagnosis Date Noted  . Primary osteoarthritis of left hip 06/06/2017  . Osteoarthritis of left hip 06/03/2017  . Preop cardiovascular exam 05/30/2017  . Other specified cardiac arrhythmias 01/02/2015  . Seborrheic dermatitis 06/12/2014  . Pacemaker 09/21/2012  . CKD (chronic kidney disease) stage 3, GFR 30-59 ml/min (HCC) 09/21/2012  . Hyperlipidemia 08/10/2010  . Impaired fasting glucose 08/10/2010  . GERD 10/04/2007  . HIATAL HERNIA WITH REFLUX 10/04/2007  . 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 Arthritis    "qwhere" (06/06/2017)  . Chronic bilateral low back pain   . CKD (chronic kidney disease) stage 3, GFR 30-59 ml/min (HCC) 09/21/2012  . Complication of anesthesia    slow to wake up  "once" (06/06/2017)  . DDD (degenerative disc disease), cervical   . Dysrhythmia   . Esophageal stricture   . GERD 10/04/2007   Qualifier: Diagnosis of  By: Linford Arnold MD, Santina Evans    . HIATAL HERNIA WITH REFLUX 10/04/2007   Qualifier: Diagnosis of  By: Linford Arnold MD, Santina Evans    . History of hiatal hernia   . History of kidney stones   . Hyperlipidemia 08/10/2010   Qualifier: Diagnosis of  By: Linford Arnold MD, Santina Evans    . Hypertension   . HYPERTENSION, BENIGN ESSENTIAL 04/27/2007   Qualifier: Diagnosis of  By: Linford Arnold MD, Santina Evans    . Impaired fasting glucose 08/10/2010   Qualifier: Diagnosis of  By: Linford Arnold MD, Santina Evans    . Lobar pneumonia (HCC)    "never missed a day of work w/it"  . Other specified cardiac arrhythmias 01/02/2015  . 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 Laurier Nancy, [facility]   . Seborrheic dermatitis 06/12/2014    Past Surgical History:  Procedure Laterality Date  . ABDOMINAL HYSTERECTOMY    . BACK SURGERY    . BLADDER SUSPENSION    . CARPAL TUNNEL RELEASE Bilateral   . CYSTOSCOPY W/ STONE MANIPULATION    . DILATION AND CURETTAGE OF UTERUS    . ESOPHAGOGASTRODUODENOSCOPY (EGD) WITH ESOPHAGEAL DILATION  X 1  . GUM SURGERY     ramus frame implant lower gum  . INSERT / REPLACE / REMOVE PACEMAKER  08/2012  . JOINT REPLACEMENT    . LUMBAR DISC SURGERY  X 2  . TARSAL TUNNEL RELEASE Bilateral   .  TONSILLECTOMY    . TOTAL HIP ARTHROPLASTY Left 06/06/2017  . TOTAL HIP ARTHROPLASTY Left 06/06/2017   Procedure: TOTAL HIP ARTHROPLASTY ANTERIOR APPROACH;  Surgeon: [name], MD;  Location: [facility] OR;  Service: Orthopedics;  Laterality: Left;  . TOTAL KNEE ARTHROPLASTY Left 2006  . TOTAL KNEE REVISION Left 2007   new hardware  due to staph infection, on chronic keflex  . TRANSTHORACIC ECHOCARDIOGRAM     08/14/12 [facility]): NL LVF, no segmental abnormality,  borderline LVH, mild MR, mild MR, trace TR    No current facility-administered medications for this encounter.    Current Outpatient Medications  Medication Sig Dispense Refill Last Dose  . cephALEXin (KEFLEX) 500 MG capsule Take 500 mg by mouth 2 (two) times daily.   Not Taking  . amLODipine (NORVASC) 10 MG tablet Take 1 tablet (10 mg total) by mouth daily. 90 tablet 1   . lisinopril-hydrochlorothiazide (PRINZIDE,ZESTORETIC) 20-12.5 MG tablet Take 1 tablet by mouth 2 (two) times daily. 180 tablet 1    No Known Allergies  Social History   Tobacco Use  . Smoking status: Never Smoker  . Smokeless tobacco: Never Used  Substance Use Topics  . Alcohol use: No    Family History  Problem Relation Age of Onset  . Heart attack Mother   . Diabetes Mother 24  . Hypertension Mother   . Hypertension Father      Review of Systems  Constitutional: Positive for diaphoresis.  HENT: Negative.   Eyes: Negative.   Respiratory: Positive for shortness of breath.   Cardiovascular:       HTN  Gastrointestinal: Negative.   Genitourinary: Negative.   Musculoskeletal: Positive for joint pain.  Neurological: Negative.   Endo/Heme/Allergies: Negative.   Psychiatric/Behavioral: Negative.     Objective:  Physical Exam  Constitutional: She is oriented to person, place, and time. She appears well-developed and well-nourished.  HENT:  Head: Normocephalic and atraumatic.  Eyes: Pupils are equal, round, and reactive to light.  Neck: Normal range of motion. Neck supple.  Cardiovascular: Intact distal pulses.  Respiratory: Effort normal.  Musculoskeletal: She exhibits tenderness.  the patient has no pain with left hip flexion extension internal and external rotation or log roll.  Patient's right knee has severe pain over the medial joint line.  Obvious crepitance with range of motion.  She has a range from 5 to 105.  No instability.  Calves are soft and nontender.  Neurological: She is alert and  oriented to person, place, and time.  Skin: Skin is warm and dry.  Psychiatric: She has a normal mood and affect. Her behavior is normal. Judgment and thought content normal.    Vital signs in last 24 hours:    Labs:   Estimated body mass index is 34.54 kg/m as calculated from the following:   Height as of 11/23/17:  (1.6 m).   Weight as of 11/23/17: 88.5 kg (195 lb).   Imaging Review Plain radiographs demonstrate  show severe end-stage arthritis bone-on-bone medial compartment with periarticular osteophyte formation.   Preoperative templating of the joint replacement has been completed, documented, and submitted to the Operating Room personnel in order to optimize intra-operative equipment management.   Anticipated LOS equal to or greater than 2 midnights due to - Age 61 and older with one or more of the following:  - Obesity  - Expected need for hospital services (PT, OT, Nursing) required for safe  discharge  - Anticipated need for postoperative skilled nursing  care or inpatient rehab  - Active co-morbidities: Chronic pain requiring opiods and CKD   Assessment/Plan:  End stage arthritis, right knee   The patient history, physical examination, clinical judgment of the provider and imaging studies are consistent with end stage degenerative joint disease of the right knee(s) and total knee arthroplasty is deemed medically necessary. The treatment options including medical management, injection therapy arthroscopy and arthroplasty were discussed at length. The risks and benefits of total knee arthroplasty were presented and reviewed. The risks due to aseptic loosening, infection, stiffness, patella tracking problems, thromboembolic complications and other imponderables were discussed. The patient acknowledged the explanation, agreed to proceed with the plan and consent was signed. Patient is being admitted for inpatient treatment for surgery, pain control, PT, OT, prophylactic  antibiotics, VTE prophylaxis, progressive ambulation and ADL's and discharge planning. The patient is planning to be discharged home with home health services.

## 2017-11-29 NOTE — Patient Instructions (Signed)
Medication Instructions:  Your physician recommends that you continue on your current medications as directed. Please refer to the Current Medication list given to you today.  Labwork: None  Testing/Procedures: None  Follow-Up: Your physician wants you to follow-up in: 6 months. You will receive a reminder letter in the mail two months in advance. If you don't receive a letter, please call our office to schedule the follow-up appointment.  You will receive a phone call to schedule with electrophysiology-Dr. Elberta Fortis.  Any Other Special Instructions Will Be Listed Below (If Applicable).     If you need a refill on your cardiac medications before your next appointment, please call your pharmacy.

## 2017-11-30 ENCOUNTER — Encounter (HOSPITAL_COMMUNITY): Payer: Self-pay | Admitting: *Deleted

## 2017-11-30 ENCOUNTER — Encounter (HOSPITAL_COMMUNITY)
Admission: AD | Disposition: A | Discharge status: Home / Self Care | Payer: Self-pay | Source: Ambulatory Visit | Attending: Orthopedic Surgery

## 2017-11-30 ENCOUNTER — Observation Stay (HOSPITAL_COMMUNITY)
Admission: AD | Admit: 2017-11-30 | Discharge: 2017-12-01 | Disposition: A | Discharge status: Home / Self Care | Payer: Medicare Other | Source: Ambulatory Visit | Attending: Orthopedic Surgery | Admitting: Orthopedic Surgery

## 2017-11-30 ENCOUNTER — Ambulatory Visit (HOSPITAL_COMMUNITY): Payer: Medicare Other | Admitting: Emergency Medicine

## 2017-11-30 ENCOUNTER — Other Ambulatory Visit: Payer: Self-pay

## 2017-11-30 ENCOUNTER — Ambulatory Visit (HOSPITAL_COMMUNITY): Payer: Medicare Other | Admitting: Anesthesiology

## 2017-11-30 DIAGNOSIS — Z8249 Family history of ischemic heart disease and other diseases of the circulatory system: Secondary | ICD-10-CM | POA: Insufficient documentation

## 2017-11-30 DIAGNOSIS — N183 Chronic kidney disease, stage 3 (moderate): Secondary | ICD-10-CM | POA: Insufficient documentation

## 2017-11-30 DIAGNOSIS — Z79899 Other long term (current) drug therapy: Secondary | ICD-10-CM | POA: Diagnosis not present

## 2017-11-30 DIAGNOSIS — Z96652 Presence of left artificial knee joint: Secondary | ICD-10-CM | POA: Insufficient documentation

## 2017-11-30 DIAGNOSIS — Z7982 Long term (current) use of aspirin: Secondary | ICD-10-CM | POA: Diagnosis not present

## 2017-11-30 DIAGNOSIS — Z96642 Presence of left artificial hip joint: Secondary | ICD-10-CM | POA: Insufficient documentation

## 2017-11-30 DIAGNOSIS — I129 Hypertensive chronic kidney disease with stage 1 through stage 4 chronic kidney disease, or unspecified chronic kidney disease: Secondary | ICD-10-CM | POA: Insufficient documentation

## 2017-11-30 DIAGNOSIS — E669 Obesity, unspecified: Secondary | ICD-10-CM | POA: Diagnosis not present

## 2017-11-30 DIAGNOSIS — K219 Gastro-esophageal reflux disease without esophagitis: Secondary | ICD-10-CM | POA: Diagnosis not present

## 2017-11-30 DIAGNOSIS — I4891 Unspecified atrial fibrillation: Secondary | ICD-10-CM | POA: Diagnosis not present

## 2017-11-30 DIAGNOSIS — Z6834 Body mass index (BMI) 34.0-34.9, adult: Secondary | ICD-10-CM | POA: Insufficient documentation

## 2017-11-30 DIAGNOSIS — Z950 Presence of cardiac pacemaker: Secondary | ICD-10-CM | POA: Diagnosis not present

## 2017-11-30 DIAGNOSIS — M1711 Unilateral primary osteoarthritis, right knee: Principal | ICD-10-CM | POA: Diagnosis present

## 2017-11-30 HISTORY — PX: TOTAL KNEE ARTHROPLASTY: SHX125

## 2017-11-30 SURGERY — ARTHROPLASTY, KNEE, TOTAL
Anesthesia: Spinal | Laterality: Right

## 2017-11-30 MED ORDER — PHENYLEPHRINE 40 MCG/ML (10ML) SYRINGE FOR IV PUSH (FOR BLOOD PRESSURE SUPPORT)
PREFILLED_SYRINGE | INTRAVENOUS | Status: DC | PRN
  Administered 2017-11-30 (×2): 120 ug via INTRAVENOUS

## 2017-11-30 MED ORDER — CEPHALEXIN 500 MG PO CAPS
500.0000 mg | ORAL_CAPSULE | Freq: Two times a day (BID) | ORAL | Status: DC
  Administered 2017-11-30 – 2017-12-01 (×2): 500 mg via ORAL
  Filled 2017-11-30 (×2): qty 1

## 2017-11-30 MED ORDER — HYDROCHLOROTHIAZIDE 12.5 MG PO CAPS
12.5000 mg | ORAL_CAPSULE | Freq: Every day | ORAL | Status: DC
  Administered 2017-12-01: 12.5 mg via ORAL
  Filled 2017-11-30: qty 1

## 2017-11-30 MED ORDER — LACTATED RINGERS IV SOLN
INTRAVENOUS | Status: DC
  Administered 2017-11-30 (×2): via INTRAVENOUS

## 2017-11-30 MED ORDER — FENTANYL CITRATE (PF) 100 MCG/2ML IJ SOLN
INTRAMUSCULAR | Status: AC
  Administered 2017-11-30: 50 ug via INTRAVENOUS
  Filled 2017-11-30: qty 2

## 2017-11-30 MED ORDER — GABAPENTIN 300 MG PO CAPS
300.0000 mg | ORAL_CAPSULE | Freq: Three times a day (TID) | ORAL | Status: DC
  Administered 2017-11-30 – 2017-12-01 (×2): 300 mg via ORAL
  Filled 2017-11-30 (×2): qty 1

## 2017-11-30 MED ORDER — BISACODYL 5 MG PO TBEC: 5.0000 mg | DELAYED_RELEASE_TABLET | Freq: Every day | ORAL | Status: DC | PRN

## 2017-11-30 MED ORDER — AMLODIPINE BESYLATE 10 MG PO TABS
10.0000 mg | ORAL_TABLET | Freq: Every day | ORAL | Status: DC
  Administered 2017-12-01: 10 mg via ORAL
  Filled 2017-11-30 (×2): qty 1

## 2017-11-30 MED ORDER — DEXAMETHASONE SODIUM PHOSPHATE 10 MG/ML IJ SOLN
INTRAMUSCULAR | Status: AC
  Filled 2017-11-30: qty 1

## 2017-11-30 MED ORDER — PANTOPRAZOLE SODIUM 40 MG PO TBEC
40.0000 mg | DELAYED_RELEASE_TABLET | Freq: Every day | ORAL | Status: DC
  Administered 2017-12-01: 40 mg via ORAL
  Filled 2017-11-30 (×2): qty 1

## 2017-11-30 MED ORDER — BUPIVACAINE-EPINEPHRINE (PF) 0.25% -1:200000 IJ SOLN
INTRAMUSCULAR | Status: DC | PRN
  Administered 2017-11-30: 30 mL via PERINEURAL

## 2017-11-30 MED ORDER — DOCUSATE SODIUM 100 MG PO CAPS
100.0000 mg | ORAL_CAPSULE | Freq: Two times a day (BID) | ORAL | Status: DC
  Administered 2017-11-30 – 2017-12-01 (×2): 100 mg via ORAL
  Filled 2017-11-30 (×2): qty 1

## 2017-11-30 MED ORDER — HYDROMORPHONE HCL 2 MG/ML IJ SOLN
0.5000 mg | INTRAMUSCULAR | Status: DC | PRN
  Administered 2017-12-01: 1 mg via INTRAVENOUS
  Filled 2017-11-30: qty 1

## 2017-11-30 MED ORDER — POLYETHYLENE GLYCOL 3350 17 G PO PACK: 17.0000 g | PACK | Freq: Every day | ORAL | Status: DC | PRN

## 2017-11-30 MED ORDER — CHLORHEXIDINE GLUCONATE 4 % EX LIQD: 60.0000 mL | Freq: Once | CUTANEOUS | Status: DC

## 2017-11-30 MED ORDER — METOCLOPRAMIDE HCL 5 MG/ML IJ SOLN: 5.0000 mg | Freq: Three times a day (TID) | INTRAMUSCULAR | Status: DC | PRN

## 2017-11-30 MED ORDER — DIPHENHYDRAMINE HCL 12.5 MG/5ML PO ELIX: 12.5000 mg | ORAL_SOLUTION | ORAL | Status: DC | PRN

## 2017-11-30 MED ORDER — OXYCODONE-ACETAMINOPHEN 5-325 MG PO TABS: 1.0000 | ORAL_TABLET | ORAL | 0 refills | Status: DC | PRN

## 2017-11-30 MED ORDER — PHENYLEPHRINE 40 MCG/ML (10ML) SYRINGE FOR IV PUSH (FOR BLOOD PRESSURE SUPPORT)
PREFILLED_SYRINGE | INTRAVENOUS | Status: AC
  Filled 2017-11-30: qty 10

## 2017-11-30 MED ORDER — CELECOXIB 200 MG PO CAPS
200.0000 mg | ORAL_CAPSULE | Freq: Two times a day (BID) | ORAL | Status: DC
  Administered 2017-11-30 – 2017-12-01 (×2): 200 mg via ORAL
  Filled 2017-11-30 (×2): qty 1

## 2017-11-30 MED ORDER — METOCLOPRAMIDE HCL 5 MG/ML IJ SOLN: 10.0000 mg | Freq: Once | INTRAMUSCULAR | Status: DC | PRN

## 2017-11-30 MED ORDER — ASPIRIN EC 325 MG PO TBEC: 325.0000 mg | DELAYED_RELEASE_TABLET | Freq: Two times a day (BID) | ORAL | 0 refills | Status: DC

## 2017-11-30 MED ORDER — HYDROMORPHONE HCL 2 MG/ML IJ SOLN
0.3000 mg | INTRAMUSCULAR | Status: DC | PRN
  Administered 2017-11-30 (×2): 0.5 mg via INTRAVENOUS

## 2017-11-30 MED ORDER — PROPOFOL 500 MG/50ML IV EMUL
INTRAVENOUS | Status: DC | PRN
  Administered 2017-11-30: 100 ug/kg/min via INTRAVENOUS

## 2017-11-30 MED ORDER — MIDAZOLAM HCL 2 MG/2ML IJ SOLN
2.0000 mg | Freq: Once | INTRAMUSCULAR | Status: AC
  Administered 2017-11-30: 2 mg via INTRAVENOUS

## 2017-11-30 MED ORDER — FENTANYL CITRATE (PF) 250 MCG/5ML IJ SOLN
INTRAMUSCULAR | Status: AC
  Filled 2017-11-30: qty 5

## 2017-11-30 MED ORDER — LIDOCAINE 2% (20 MG/ML) 5 ML SYRINGE
INTRAMUSCULAR | Status: AC
  Filled 2017-11-30: qty 10

## 2017-11-30 MED ORDER — PROPOFOL 10 MG/ML IV BOLUS
INTRAVENOUS | Status: DC | PRN
  Administered 2017-11-30: 30 mg via INTRAVENOUS

## 2017-11-30 MED ORDER — BUPIVACAINE IN DEXTROSE 0.75-8.25 % IT SOLN
INTRATHECAL | Status: DC | PRN
  Administered 2017-11-30: 1.6 mL via INTRATHECAL

## 2017-11-30 MED ORDER — ACETAMINOPHEN 325 MG PO TABS: 325.0000 mg | ORAL_TABLET | Freq: Four times a day (QID) | ORAL | Status: DC | PRN

## 2017-11-30 MED ORDER — TRANEXAMIC ACID 1000 MG/10ML IV SOLN
1000.0000 mg | Freq: Once | INTRAVENOUS | Status: AC
  Administered 2017-11-30: 1000 mg via INTRAVENOUS
  Filled 2017-11-30: qty 10

## 2017-11-30 MED ORDER — LISINOPRIL 20 MG PO TABS
20.0000 mg | ORAL_TABLET | Freq: Every day | ORAL | Status: DC
  Administered 2017-12-01: 20 mg via ORAL
  Filled 2017-11-30 (×2): qty 1

## 2017-11-30 MED ORDER — MENTHOL 3 MG MT LOZG: 1.0000 | LOZENGE | OROMUCOSAL | Status: DC | PRN

## 2017-11-30 MED ORDER — HYDROMORPHONE HCL 1 MG/ML IJ SOLN: 0.5000 mg | INTRAMUSCULAR | Status: DC | PRN

## 2017-11-30 MED ORDER — ONDANSETRON HCL 4 MG/2ML IJ SOLN: 4.0000 mg | Freq: Four times a day (QID) | INTRAMUSCULAR | Status: DC | PRN

## 2017-11-30 MED ORDER — DEXAMETHASONE SODIUM PHOSPHATE 10 MG/ML IJ SOLN
INTRAMUSCULAR | Status: DC | PRN
  Administered 2017-11-30: 10 mg via INTRAVENOUS

## 2017-11-30 MED ORDER — METOCLOPRAMIDE HCL 5 MG PO TABS: 5.0000 mg | ORAL_TABLET | Freq: Three times a day (TID) | ORAL | Status: DC | PRN

## 2017-11-30 MED ORDER — MIDAZOLAM HCL 2 MG/2ML IJ SOLN
INTRAMUSCULAR | Status: AC
  Administered 2017-11-30: 2 mg via INTRAVENOUS
  Filled 2017-11-30: qty 2

## 2017-11-30 MED ORDER — MEPERIDINE HCL 50 MG/ML IJ SOLN: 6.2500 mg | INTRAMUSCULAR | Status: DC | PRN

## 2017-11-30 MED ORDER — LISINOPRIL-HYDROCHLOROTHIAZIDE 20-12.5 MG PO TABS: 1.0000 | ORAL_TABLET | Freq: Two times a day (BID) | ORAL | Status: DC

## 2017-11-30 MED ORDER — EPHEDRINE 5 MG/ML INJ
INTRAVENOUS | Status: AC
  Filled 2017-11-30: qty 10

## 2017-11-30 MED ORDER — ROCURONIUM BROMIDE 10 MG/ML (PF) SYRINGE
PREFILLED_SYRINGE | INTRAVENOUS | Status: AC
  Filled 2017-11-30: qty 5

## 2017-11-30 MED ORDER — ONDANSETRON HCL 4 MG/2ML IJ SOLN
INTRAMUSCULAR | Status: AC
  Filled 2017-11-30: qty 2

## 2017-11-30 MED ORDER — LACTATED RINGERS IV SOLN: INTRAVENOUS | Status: DC

## 2017-11-30 MED ORDER — SODIUM CHLORIDE 0.9 % IJ SOLN
INTRAMUSCULAR | Status: DC | PRN
  Administered 2017-11-30: 50 mL via INTRAVENOUS

## 2017-11-30 MED ORDER — PHENOL 1.4 % MT LIQD: 1.0000 | OROMUCOSAL | Status: DC | PRN

## 2017-11-30 MED ORDER — ALUM & MAG HYDROXIDE-SIMETH 200-200-20 MG/5ML PO SUSP: 30.0000 mL | ORAL | Status: DC | PRN

## 2017-11-30 MED ORDER — SUCCINYLCHOLINE CHLORIDE 200 MG/10ML IV SOSY
PREFILLED_SYRINGE | INTRAVENOUS | Status: AC
  Filled 2017-11-30: qty 10

## 2017-11-30 MED ORDER — ASPIRIN EC 325 MG PO TBEC
325.0000 mg | DELAYED_RELEASE_TABLET | Freq: Every day | ORAL | Status: DC
  Administered 2017-12-01: 325 mg via ORAL
  Filled 2017-11-30: qty 1

## 2017-11-30 MED ORDER — ROPIVACAINE HCL 7.5 MG/ML IJ SOLN
INTRAMUSCULAR | Status: DC | PRN
  Administered 2017-11-30: 20 mL via PERINEURAL

## 2017-11-30 MED ORDER — OXYCODONE HCL 5 MG PO TABS
ORAL_TABLET | ORAL | Status: AC
  Filled 2017-11-30: qty 2

## 2017-11-30 MED ORDER — METHOCARBAMOL 500 MG PO TABS
500.0000 mg | ORAL_TABLET | Freq: Four times a day (QID) | ORAL | Status: DC | PRN
  Administered 2017-11-30 – 2017-12-01 (×2): 500 mg via ORAL
  Filled 2017-11-30: qty 1

## 2017-11-30 MED ORDER — PROPOFOL 10 MG/ML IV BOLUS
INTRAVENOUS | Status: AC
  Filled 2017-11-30: qty 20

## 2017-11-30 MED ORDER — TIZANIDINE HCL 2 MG PO TABS: 2.0000 mg | ORAL_TABLET | Freq: Four times a day (QID) | ORAL | 0 refills | Status: DC | PRN

## 2017-11-30 MED ORDER — METHOCARBAMOL 1000 MG/10ML IJ SOLN
500.0000 mg | Freq: Four times a day (QID) | INTRAVENOUS | Status: DC | PRN
  Filled 2017-11-30: qty 5

## 2017-11-30 MED ORDER — PHENYLEPHRINE HCL 10 MG/ML IJ SOLN
INTRAVENOUS | Status: DC | PRN
  Administered 2017-11-30: 25 ug/min via INTRAVENOUS

## 2017-11-30 MED ORDER — ONDANSETRON HCL 4 MG PO TABS: 4.0000 mg | ORAL_TABLET | Freq: Four times a day (QID) | ORAL | Status: DC | PRN

## 2017-11-30 MED ORDER — KCL IN DEXTROSE-NACL 20-5-0.45 MEQ/L-%-% IV SOLN
INTRAVENOUS | Status: DC
  Administered 2017-11-30: 100 mL/h via INTRAVENOUS
  Filled 2017-11-30: qty 1000

## 2017-11-30 MED ORDER — ZOLPIDEM TARTRATE 5 MG PO TABS: 5.0000 mg | ORAL_TABLET | Freq: Every evening | ORAL | Status: DC | PRN

## 2017-11-30 MED ORDER — OXYCODONE HCL 5 MG PO TABS
5.0000 mg | ORAL_TABLET | ORAL | Status: DC | PRN
  Administered 2017-11-30 – 2017-12-01 (×2): 10 mg via ORAL
  Filled 2017-11-30: qty 2

## 2017-11-30 MED ORDER — KCL IN DEXTROSE-NACL 20-5-0.45 MEQ/L-%-% IV SOLN
INTRAVENOUS | Status: AC
  Administered 2017-11-30: 100 mL/h via INTRAVENOUS
  Filled 2017-11-30: qty 1000

## 2017-11-30 MED ORDER — SODIUM CHLORIDE 0.9 % IR SOLN
Status: DC | PRN
  Administered 2017-11-30: 3000 mL

## 2017-11-30 MED ORDER — FENTANYL CITRATE (PF) 100 MCG/2ML IJ SOLN
50.0000 ug | Freq: Once | INTRAMUSCULAR | Status: AC
  Administered 2017-11-30: 50 ug via INTRAVENOUS

## 2017-11-30 MED ORDER — HYDROMORPHONE HCL 2 MG/ML IJ SOLN
INTRAMUSCULAR | Status: AC
  Administered 2017-11-30: 0.5 mg via INTRAVENOUS
  Filled 2017-11-30: qty 1

## 2017-11-30 MED ORDER — METHOCARBAMOL 500 MG PO TABS
ORAL_TABLET | ORAL | Status: AC
  Filled 2017-11-30: qty 1

## 2017-11-30 MED ORDER — FLEET ENEMA 7-19 GM/118ML RE ENEM: 1.0000 | ENEMA | Freq: Once | RECTAL | Status: DC | PRN

## 2017-11-30 MED ORDER — BUPIVACAINE-EPINEPHRINE 0.25% -1:200000 IJ SOLN
INTRAMUSCULAR | Status: AC
  Filled 2017-11-30: qty 1

## 2017-11-30 MED ORDER — CEFAZOLIN SODIUM-DEXTROSE 2-4 GM/100ML-% IV SOLN
2.0000 g | INTRAVENOUS | Status: AC
  Administered 2017-11-30: 2 g via INTRAVENOUS
  Filled 2017-11-30: qty 100

## 2017-11-30 MED ORDER — ONDANSETRON HCL 4 MG/2ML IJ SOLN
INTRAMUSCULAR | Status: DC | PRN
  Administered 2017-11-30: 4 mg via INTRAVENOUS

## 2017-11-30 SURGICAL SUPPLY — 59 items
BANDAGE ELASTIC 6 VELCRO ST LF (GAUZE/BANDAGES/DRESSINGS) ×2 IMPLANT
BANDAGE ESMARK 6X9 LF (GAUZE/BANDAGES/DRESSINGS) ×1 IMPLANT
BLADE SAG 18X100X1.27 (BLADE) ×3 IMPLANT
BLADE SAGITTAL 13X1.27X60 (BLADE) IMPLANT
BLADE SAGITTAL 13X1.27X60MM (BLADE)
BLADE SAW SGTL 13X75X1.27 (BLADE) ×2 IMPLANT
BNDG CMPR 9X6 STRL LF SNTH (GAUZE/BANDAGES/DRESSINGS) ×1
BNDG CMPR MED 10X6 ELC LF (GAUZE/BANDAGES/DRESSINGS) ×1
BNDG ELASTIC 6X10 VLCR STRL LF (GAUZE/BANDAGES/DRESSINGS) ×3 IMPLANT
BNDG ESMARK 6X9 LF (GAUZE/BANDAGES/DRESSINGS) ×3
BOWL SMART MIX CTS (DISPOSABLE) ×3 IMPLANT
CAPT KNEE TOTAL 3 ATTUNE ×2 IMPLANT
CEMENT HV SMART SET (Cement) ×6 IMPLANT
COVER SURGICAL LIGHT HANDLE (MISCELLANEOUS) ×3 IMPLANT
CUFF TOURNIQUET SINGLE 34IN LL (TOURNIQUET CUFF) ×3 IMPLANT
CUFF TOURNIQUET SINGLE 44IN (TOURNIQUET CUFF) IMPLANT
DRAPE EXTREMITY T 121X128X90 (DRAPE) ×3 IMPLANT
DRAPE U-SHAPE 47X51 STRL (DRAPES) ×3 IMPLANT
DRSG AQUACEL AG ADV 3.5X10 (GAUZE/BANDAGES/DRESSINGS) ×3 IMPLANT
DURAPREP 26ML APPLICATOR (WOUND CARE) ×3 IMPLANT
ELECT REM PT RETURN 9FT ADLT (ELECTROSURGICAL) ×3
ELECTRODE REM PT RTRN 9FT ADLT (ELECTROSURGICAL) ×1 IMPLANT
GLOVE BIO SURGEON STRL SZ 6.5 (GLOVE) ×1 IMPLANT
GLOVE BIO SURGEON STRL SZ7.5 (GLOVE) ×3 IMPLANT
GLOVE BIO SURGEON STRL SZ8.5 (GLOVE) ×3 IMPLANT
GLOVE BIO SURGEONS STRL SZ 6.5 (GLOVE) ×1
GLOVE BIOGEL PI IND STRL 8 (GLOVE) ×1 IMPLANT
GLOVE BIOGEL PI IND STRL 9 (GLOVE) ×1 IMPLANT
GLOVE BIOGEL PI INDICATOR 8 (GLOVE) ×2
GLOVE BIOGEL PI INDICATOR 9 (GLOVE) ×2
GLOVE ECLIPSE 7.5 STRL STRAW (GLOVE) ×2 IMPLANT
GLOVE INDICATOR 6.5 STRL GRN (GLOVE) ×2 IMPLANT
GLOVE INDICATOR 7.5 STRL GRN (GLOVE) ×2 IMPLANT
GOWN STRL REUS W/ TWL LRG LVL3 (GOWN DISPOSABLE) ×1 IMPLANT
GOWN STRL REUS W/ TWL XL LVL3 (GOWN DISPOSABLE) ×2 IMPLANT
GOWN STRL REUS W/TWL LRG LVL3 (GOWN DISPOSABLE) ×3
GOWN STRL REUS W/TWL XL LVL3 (GOWN DISPOSABLE) ×6
HANDPIECE INTERPULSE COAX TIP (DISPOSABLE) ×3
HOOD PEEL AWAY FACE SHEILD DIS (HOOD) ×6 IMPLANT
KIT BASIN OR (CUSTOM PROCEDURE TRAY) ×3 IMPLANT
KIT TURNOVER KIT B (KITS) ×3 IMPLANT
MANIFOLD NEPTUNE II (INSTRUMENTS) ×3 IMPLANT
NEEDLE 22X1 1/2 (OR ONLY) (NEEDLE) ×6 IMPLANT
NS IRRIG 1000ML POUR BTL (IV SOLUTION) ×3 IMPLANT
PACK TOTAL JOINT (CUSTOM PROCEDURE TRAY) ×3 IMPLANT
PAD ARMBOARD 7.5X6 YLW CONV (MISCELLANEOUS) ×6 IMPLANT
SET HNDPC FAN SPRY TIP SCT (DISPOSABLE) ×1 IMPLANT
SUT VIC AB 0 CT1 27 (SUTURE) ×3
SUT VIC AB 0 CT1 27XBRD ANBCTR (SUTURE) ×1 IMPLANT
SUT VIC AB 1 CTX 36 (SUTURE) ×3
SUT VIC AB 1 CTX36XBRD ANBCTR (SUTURE) ×1 IMPLANT
SUT VIC AB 2-0 CT1 27 (SUTURE) ×3
SUT VIC AB 2-0 CT1 TAPERPNT 27 (SUTURE) ×1 IMPLANT
SUT VIC AB 3-0 CT1 27 (SUTURE) ×3
SUT VIC AB 3-0 CT1 TAPERPNT 27 (SUTURE) ×1 IMPLANT
SYR CONTROL 10ML LL (SYRINGE) ×6 IMPLANT
TOWEL OR 17X24 6PK STRL BLUE (TOWEL DISPOSABLE) ×3 IMPLANT
TOWEL OR 17X26 10 PK STRL BLUE (TOWEL DISPOSABLE) ×3 IMPLANT
TRAY CATH 16FR W/PLASTIC CATH (SET/KITS/TRAYS/PACK) IMPLANT

## 2017-11-30 NOTE — Anesthesia Procedure Notes (Signed)
Anesthesia Regional Block: Adductor canal block   Pre-Anesthetic Checklist: ,, timeout performed, Correct Patient, Correct Site, Correct Laterality, Correct Procedure, Correct Position, site marked, Risks and benefits discussed,  Surgical consent,  Pre-op evaluation,  At surgeon's request and post-op pain management  Laterality: Right  Prep: chloraprep       Needles:  Injection technique: Single-shot  Needle Type: Echogenic Stimulator Needle     Needle Length: 9cm  Needle Gauge: 21   Needle insertion depth: 9 cm   Additional Needles:   Procedures:,,,, ultrasound used (permanent image in chart),,,,  Narrative:  Start time: 11/30/2017 11:41 AM End time: 11/30/2017 11:46 AM Injection made incrementally with aspirations every 5 mL.  Performed by: Personally  Anesthesiologist: Mal Amabile, MD  Additional Notes: Timeout performed. Patient sedated. Relevant anatomy ID'd using [place]. Incremental 2-96ml injection of LA with frequent aspiration. Patient tolerated procedure well.        Right Adductor Canal Block

## 2017-11-30 NOTE — Discharge Instructions (Signed)

## 2017-11-30 NOTE — Progress Notes (Signed)
Orthopedic Tech Progress Note Patient Details:  [name] [birth year] [id number]  Ortho Devices Type of Ortho Device: Bone foam zero knee Ortho Device/Splint Interventions: Ordered   Post Interventions Instructions Provided: Care of device   Jennye Moccasin 11/30/2017, 4:29 PM

## 2017-11-30 NOTE — Transfer of Care (Signed)
Immediate Anesthesia Transfer of Care Note  Patient: [name]  Procedure(s) Performed: TOTAL KNEE ARTHROPLASTY (Right )  Patient Location: PACU  Anesthesia Type:Spinal  Level of Consciousness: awake, alert  and oriented  Airway & Oxygen Therapy: Patient Spontanous Breathing  Post-op Assessment: Report given to RN  Post vital signs: Reviewed and stable  Last Vitals:  Vitals Value Taken Time  BP    Temp    Pulse 97 11/30/2017  3:18 PM  Resp 19 11/30/2017  3:18 PM  SpO2 95 % 11/30/2017  3:18 PM  Vitals shown include unvalidated device data.  Last Pain:  Vitals:   11/30/17 1152  TempSrc:   PainSc: 0-No pain         Complications: No apparent anesthesia complications

## 2017-11-30 NOTE — Anesthesia Procedure Notes (Signed)
Procedure Name: MAC Date/Time: 11/30/2017 1:14 PM Performed by: Barrington Ellison, CRNA Pre-anesthesia Checklist: Patient identified, Emergency Drugs available, Suction available, Patient being monitored and Timeout performed Patient Re-evaluated:Patient Re-evaluated prior to induction Oxygen Delivery Method: Simple face mask

## 2017-11-30 NOTE — Interval H&P Note (Signed)
History and Physical Interval Note:  11/30/2017 12:58 PM  [name]  has presented today for surgery, with the diagnosis of RIGHT KNEE OSTEOARTHRITIS  The various methods of treatment have been discussed with the patient and family. After consideration of risks, benefits and other options for treatment, the patient has consented to  Procedure(s): TOTAL KNEE ARTHROPLASTY (Right) as a surgical intervention .  The patient's history has been reviewed, patient examined, no change in status, stable for surgery.  I have reviewed the patient's chart and labs.  Questions were answered to the patient's satisfaction.     Nestor Lewandowsky

## 2017-11-30 NOTE — Op Note (Signed)
PATIENT ID:      [name]  MRN:     [id number] DOB/AGE:    July 26, 1946 / 72 y.o.       OPERATIVE REPORT    DATE OF PROCEDURE:  11/30/2017       PREOPERATIVE DIAGNOSIS:   RIGHT KNEE OSTEOARTHRITIS      Estimated body mass index is 34.54 kg/m as calculated from the following:   Height as of this encounter:  (1.6 m).   Weight as of this encounter: 195 lb (88.5 kg).                                                        POSTOPERATIVE DIAGNOSIS:   RIGHT KNEE OSTEOARTHRITIS                                                                      PROCEDURE:  Procedure(s): TOTAL KNEE ARTHROPLASTY Using DepuyAttune RP implants #5R Femur, #5Tibia, 5 mm Attune RP bearing, 38 Patella     SURGEON: Nestor Lewandowsky    ASSISTANT:   Tomi Likens. [employer]   (Present and scrubbed throughout the case, critical for assistance with exposure, retraction, instrumentation, and closure.)         ANESTHESIA: Spinal, 20cc Exparel, 50cc 0.25% Marcaine  EBL: 300cc  FLUID REPLACEMENT: 1600 crystalloid  TOURNIQUET TIME:  Drains: None  Tranexamic Acid: 1gm IV, 2gm topical  COMPLICATIONS:  None         INDICATIONS FOR PROCEDURE: The patient has  RIGHT KNEE OSTEOARTHRITIS, Var deformities, XR shows bone on bone arthritis, lateral subluxation of tibia. Patient has failed all conservative measures including anti-inflammatory medicines, narcotics, attempts at  exercise and weight loss, cortisone injections and viscosupplementation.  Risks and benefits of surgery have been discussed, questions answered.   DESCRIPTION OF PROCEDURE: The patient identified by armband, received  IV antibiotics, in the holding area at [facility]. Patient taken to the operating room, appropriate anesthetic  monitors were attached, and Spinal anesthesia was  induced. Tourniquet  applied high to the operative thigh. Lateral post and foot positioner  applied to the table, the lower extremity was then prepped and draped  in  usual sterile fashion from the toes to the tourniquet. Time-out procedure was performed. We began the operation, with the knee flexed 120 degrees, by making the anterior midline incision starting at handbreadth above the patella going over the patella 1 cm medial to and 4 cm distal to the tibial tubercle. Small bleeders in the skin and the  subcutaneous tissue identified and cauterized. Transverse retinaculum was incised and reflected medially and a medial parapatellar arthrotomy was accomplished. the patella was everted and theprepatellar fat pad resected. The superficial medial collateral  ligament was then elevated from anterior to posterior along the proximal  flare of the tibia and anterior half of the menisci resected. The knee was hyperflexed exposing bone on bone arthritis. Peripheral and notch osteophytes as well as the cruciate ligaments were then resected. We continued to  work our way around posteriorly along the proximal  tibia, and externally  rotated the tibia subluxing it out from underneath the femur. A McHale  retractor was placed through the notch and a lateral Hohmann retractor  placed, and we then drilled through the proximal tibia in line with the  axis of the tibia followed by an intramedullary guide rod and 2-degree  posterior slope cutting guide. The tibial cutting guide, 3 degree posterior sloped, was pinned into place allowing resection of 0 mm of bone medially and 12 mm of bone laterally. Satisfied with the tibial resection, we then  entered the distal femur 2 mm anterior to the PCL origin with the  intramedullary guide rod and applied the distal femoral cutting guide  set at 9 mm, with 5 degrees of valgus. This was pinned along the  epicondylar axis. At this point, the distal femoral cut was accomplished without difficulty. We then sized for a #5R femoral component and pinned the guide in 3 degrees of external rotation. The chamfer cutting guide was pinned into place. The  anterior, posterior, and chamfer cuts were accomplished without difficulty followed by  the Attune RP box cutting guide and the box cut. We also removed posterior osteophytes from the posterior femoral condyles. At this  time, the knee was brought into full extension. We checked our  extension and flexion gaps and found them symmetric for a 5 mm bearing. Distracting in extension with a lamina spreader, the posterior horns of the menisci were removed, and Exparel, diluted to 60 cc, with 20cc NS, and 20cc 0.5% Marcaine,was injected into the capsule and synovium of the knee. The posterior patella cut was accomplished with the 9.5 mm Attune cutting guide, sized for a 38mm dome, and the fixation pegs drilled.The knee  was then once again hyperflexed exposing the proximal tibia. We sized for a # 5 tibial base plate, applied the smokestack and the conical reamer followed by the the Delta fin keel punch. We then hammered into place the Attune RP trial femoral component, drilled the lugs, inserted a  5 mm trial bearing, trial patellar button, and took the knee through range of motion from 0-130 degrees. No thumb pressure was required for patellar Tracking. At this point, the limb was wrapped with an Esmarch bandage and the tourniquet inflated to 350 mmHg. All trial components were removed, mating surfaces irrigated with pulse lavage, and dried with suction and sponges. 10 cc of the Exparel solution was applied to the cancellus bone of the patella distal femur and proximal tibia.  After waiting 1 minute, the bony surfaces were again, dried with sponges. A double batch of DePuy HV cement with 1500 mg of Zinacef was mixed and applied to all bony metallic mating surfaces except for the posterior condyles of the femur itself. In order, we hammered into place the tibial tray and removed excess cement, the femoral component and removed excess cement. The final Attune RP bearing  was inserted, and the knee brought to full  extension with compression.  The patellar button was clamped into place, and excess cement  removed. While the cement cured the wound was irrigated out with normal saline solution pulse lavage. Ligament stability and patellar tracking were checked and found to be excellent. The parapatellar arthrotomy was closed with  running #1 Vicryl suture. The subcutaneous tissue with 0 and 2-0 undyed  Vicryl suture, and the skin with running 3-0 SQ vicryl. A dressing of Xeroform,  4 x 4, dressing sponges, Webril, and Ace wrap applied. The patient  awakened, and  taken to recovery room without difficulty.   Nestor Lewandowsky 11/30/2017, 2:38 PM

## 2017-11-30 NOTE — Anesthesia Postprocedure Evaluation (Signed)
Anesthesia Post Note  Patient: [name]  Procedure(s) Performed: TOTAL KNEE ARTHROPLASTY (Right )     Patient location during evaluation: PACU Anesthesia Type: Spinal Level of consciousness: oriented and awake and alert Pain management: pain level controlled Vital Signs Assessment: post-procedure vital signs reviewed and stable Respiratory status: spontaneous breathing, respiratory function stable and patient connected to nasal cannula oxygen Cardiovascular status: blood pressure returned to baseline and stable Postop Assessment: no headache, no backache and no apparent nausea or vomiting Anesthetic complications: no    Last Vitals:  Vitals:   11/30/17 1620 11/30/17 1635  BP: (!) 121/56 (!) 124/53  Pulse: 97 99  Resp: (!) 22 16  Temp:  36.7 C  SpO2: 95% 90%    Last Pain:  Vitals:   11/30/17 1635  TempSrc:   PainSc: Asleep                 Mattison Golay COKER

## 2017-11-30 NOTE — Anesthesia Preprocedure Evaluation (Addendum)
Anesthesia Evaluation  Patient identified by MRN, date of birth, ID band Patient awake    Reviewed: Allergy & Precautions, NPO status , Patient's Chart, lab work & pertinent test results  History of Anesthesia Complications (+) history of anesthetic complications  Airway Mallampati: II       Dental no notable dental hx. (+) Dental Advisory Given, Edentulous Upper, Edentulous Lower   Pulmonary pneumonia, resolved,    Pulmonary exam normal breath sounds clear to auscultation       Cardiovascular hypertension, Pt. on medications and Pt. on home beta blockers Normal cardiovascular exam+ dysrhythmias Atrial Fibrillation + pacemaker  Rhythm:Regular Rate:Normal     Neuro/Psych negative neurological ROS  negative psych ROS   GI/Hepatic hiatal hernia, GERD  Medicated and Controlled,  Endo/Other  Obesity Hyperlipidemia  Renal/GU Renal InsufficiencyRenal disease  negative genitourinary   Musculoskeletal  (+) Arthritis , Osteoarthritis,  OA right knee   Abdominal (+) + obese,   Peds  Hematology  (+) anemia ,   Anesthesia Other Findings   Reproductive/Obstetrics                           Anesthesia Physical Anesthesia Plan  ASA: III  Anesthesia Plan: General   Post-op Pain Management:  Regional for Post-op pain   Induction: Intravenous  PONV Risk Score and Plan: 3 and Propofol infusion, Ondansetron, Dexamethasone and Treatment may vary due to age or medical condition  Airway Management Planned: Oral ETT  Additional Equipment:   Intra-op Plan:   Post-operative Plan: Extubation in OR  Informed Consent: I have reviewed the patients History and Physical, chart, labs and discussed the procedure including the risks, benefits and alternatives for the proposed anesthesia with the patient or authorized representative who has indicated his/her understanding and acceptance.   Dental advisory  given  Plan Discussed with: CRNA, Anesthesiologist and Surgeon  Anesthesia Plan Comments:        Anesthesia Quick Evaluation

## 2017-11-30 NOTE — Anesthesia Procedure Notes (Signed)
Spinal  Patient location during procedure: OR Start time: 11/30/2017 1:19 PM Staffing Anesthesiologist: Mal Amabile, MD Performed: anesthesiologist  Preanesthetic Checklist Completed: patient identified, site marked, surgical consent, pre-op evaluation, timeout performed, IV checked, risks and benefits discussed and monitors and equipment checked Spinal Block Patient position: sitting Prep: site prepped and draped and DuraPrep Patient monitoring: heart rate, cardiac monitor, continuous pulse ox and blood pressure Approach: midline Location: L3-4 Injection technique: single-shot Needle Needle type: Pencan  Needle gauge: 24 G Needle length: 9 cm Needle insertion depth: 6 cm Assessment Sensory level: T4 Additional Notes Patient tolerated procedure well. Adequate sensory level.

## 2017-12-01 ENCOUNTER — Encounter (HOSPITAL_COMMUNITY): Payer: Self-pay | Admitting: Orthopedic Surgery

## 2017-12-01 DIAGNOSIS — M1711 Unilateral primary osteoarthritis, right knee: Secondary | ICD-10-CM | POA: Diagnosis not present

## 2017-12-01 LAB — BASIC METABOLIC PANEL
Anion gap: 10 (ref 5–15)
Anion gap: 13 (ref 5–15)
BUN: 54 mg/dL — ABNORMAL HIGH (ref 6–20)
BUN: 5 mg/dL — AB (ref 6–20)
CHLORIDE: 101 mmol/L (ref 101–111)
CO2: 21 mmol/L — AB (ref 22–32)
CO2: 21 mmol/L — ABNORMAL LOW (ref 22–32)
CREATININE: 2.23 mg/dL — AB (ref 0.44–1.00)
CREATININE: 0.65 mg/dL (ref 0.44–1.00)
Calcium: 8.4 mg/dL — ABNORMAL LOW (ref 8.9–10.3)
Calcium: 8.5 mg/dL — ABNORMAL LOW (ref 8.9–10.3)
Chloride: 106 mmol/L (ref 101–111)
GFR calc non Af Amer: 21 mL/min — ABNORMAL LOW (ref >60)
GFR, EST AFRICAN AMERICAN: 24 mL/min — AB (ref >60)
Glucose, Bld: 210 mg/dL — ABNORMAL HIGH (ref 65–99)
Glucose, Bld: 78 mg/dL (ref 65–99)
Potassium: 7 mmol/L (ref 3.5–5.1)
Potassium: 3.2 mmol/L — ABNORMAL LOW (ref 3.5–5.1)
SODIUM: 140 mmol/L (ref 135–145)
Sodium: 132 mmol/L — ABNORMAL LOW (ref 135–145)

## 2017-12-01 LAB — CBC
HEMATOCRIT: 34.1 % — AB (ref 36.0–46.0)
Hemoglobin: 11 g/dL — ABNORMAL LOW (ref 12.0–15.0)
MCH: 29.6 pg (ref 26.0–34.0)
MCHC: 32.3 g/dL (ref 30.0–36.0)
MCV: 91.7 fL (ref 78.0–100.0)
Platelets: 312 10*3/uL (ref 150–400)
RBC: 3.72 MIL/uL — ABNORMAL LOW (ref 3.87–5.11)
RDW: 14.8 % (ref 11.5–15.5)
WBC: 16.8 10*3/uL — ABNORMAL HIGH (ref 4.0–10.5)

## 2017-12-01 NOTE — Progress Notes (Signed)
Inpatient Diabetes Program Recommendations  AACE/ADA: New Consensus Statement on Inpatient Glycemic Control (2015)  Target Ranges:  Prepandial:   less than 140 mg/dL      Peak postprandial:   less than 180 mg/dL (1-2 hours)      Critically ill patients:  140 - 180 mg/dL   Lab Results  Component Value Date   HGBA1C 6.2 11/23/2017    Review of Glycemic Control Results for [name], [name] (MRN [id number]) as of 12/01/2017 10:27  Ref. Range 12/01/2017 03:50 12/01/2017 08:37  Glucose Latest Ref Range: 65 - 99 mg/dL 425 (H) 78   Diabetes history: no hx noted Outpatient Diabetes medications: none Current orders for Inpatient glycemic control: none  Inpatient Diabetes Program Recommendations:    Patient received Decadron 10 mg X1, thus anticipate BS to be increased. A1C 6.2%. Would recommend checking BS TID and HS.   Regular diet ordered, may consider switching to carb modified?  Thanks, Lujean Rave, MSN, RNC-OB Diabetes Coordinator [phone] (8a-5p)

## 2017-12-01 NOTE — Evaluation (Signed)
Physical Therapy Evaluation Patient Details Name: [name] MRN: [id number] DOB: 19-Jul-1946 Today's Date: 12/01/2017   History of Present Illness  Pt. is a 72 y.o. F with significant PMH of pacemaker, CKD stage 3, hyperlipidemia, L THA, and L TKA. Now s/p right total knee arthroplasty.   Clinical Impression  Pt is s/p TKA resulting in the deficits listed below (see PT Problem List).  Patient ambulating 200 feet with RW and supervision. Presenting with expected post op weakness, decreased range of motion, and gait deviations. Gait speed indicative of limited community ambulator. Recommending follow up physical therapy to address deficits and maximize functional independence. Pt will benefit from skilled PT to increase their independence and safety with mobility.    Follow Up Recommendations Follow surgeon's recommendation for DC plan and follow-up therapies   (patient stating she wants PT at [facility])    Equipment Recommendations  Rolling walker with 5" wheels    Recommendations for Other Services       Precautions / Restrictions Precautions Precautions: Fall;Knee Precaution Booklet Issued: Yes (comment) Restrictions Weight Bearing Restrictions: Yes RLE Weight Bearing: Weight bearing as tolerated      Mobility  Bed Mobility Overal bed mobility: Modified Independent             General bed mobility comments: Modified independent with supine to sit  Transfers Overall transfer level: Needs assistance Equipment used: Rolling walker (2 wheeled) Transfers: Sit to/from Stand Sit to Stand: Supervision         General transfer comment: Supervision for safety from bed and toilet  Ambulation/Gait Ambulation/Gait assistance: Supervision Ambulation Distance (Feet): 200 Feet Assistive device: Rolling walker (2 wheeled) Gait Pattern/deviations: Step-through pattern;Decreased weight shift to right Gait velocity: 1.4 ft/s Gait velocity interpretation: 1.31 - 2.62  ft/sec, indicative of limited community ambulator General Gait Details: VC's for decreased right foot external rotation and increased right knee flexion for swing through; patient unable to correct, stating, "I've been walking this way forever."  Stairs            Wheelchair Mobility    Modified Rankin (Stroke Patients Only)       Balance Overall balance assessment: Mild deficits observed, not formally tested                                           Pertinent Vitals/Pain Pain Assessment: No/denies pain    Home Living Family/patient expects to be discharged to:: Private residence Living Arrangements: Alone Available Help at Discharge: Family;Available 24 hours/day Type of Home: House Home Access: Level entry     Home Layout: One level Home Equipment: Emergency planning/management officer - 2 wheels;Cane - single point;Bedside commode;Toilet riser;Grab bars - toilet;Grab bars - tub/shower      Prior Function Level of Independence: Independent with assistive device(s)         Comments: use of RW     Hand Dominance   Dominant Hand: Right    Extremity/Trunk Assessment   Upper Extremity Assessment Upper Extremity Assessment: Overall WFL for tasks assessed    Lower Extremity Assessment Lower Extremity Assessment: RLE deficits/detail;LLE deficits/detail RLE Deficits / Details: expected post op deficits. Able to perform quad set and ankle dorsiflexion/plantarflexion LLE Deficits / Details: [facility]       Communication   Communication: No difficulties  Cognition Arousal/Alertness: Awake/alert Behavior During Therapy: WFL for tasks assessed/performed Overall Cognitive Status: Within Functional  Limits for tasks assessed                                        General Comments      Exercises Total Joint Exercises Ankle Circles/Pumps: 10 reps;Both;Seated Quad Sets: 10 reps;Both;Seated Heel Slides: 5 reps;Right;Seated Goniometric ROM: Seated:  0 degrees extension - 100 degrees flexion   Assessment/Plan    PT Assessment Patient needs continued PT services  PT Problem List Decreased strength;Decreased range of motion;Decreased activity tolerance;Decreased balance;Decreased mobility;Decreased knowledge of precautions       PT Treatment Interventions DME instruction;Gait training;Functional mobility training;Stair training;Therapeutic activities;Therapeutic exercise;Balance training;Patient/family education    PT Goals (Current goals can be found in the Care Plan section)  Acute Rehab PT Goals Patient Stated Goal: Do aquatic therapy PT Goal Formulation: With patient Time For Goal Achievement: 12/15/17    Frequency 7X/week   Barriers to discharge        Co-evaluation               AM-PAC PT "6 Clicks" Daily Activity  Outcome Measure Difficulty turning over in bed (including adjusting bedclothes, sheets and blankets)?: None Difficulty moving from lying on back to sitting on the side of the bed? : None Difficulty sitting down on and standing up from a chair with arms (e.g., wheelchair, bedside commode, etc,.)?: A Little Help needed moving to and from a bed to chair (including a wheelchair)?: A Little Help needed walking in hospital room?: A Little Help needed climbing 3-5 steps with a railing? : A Little 6 Click Score: 20    End of Session Equipment Utilized During Treatment: Gait belt Activity Tolerance: Patient tolerated treatment well Patient left: in chair;with call bell/phone within reach;with nursing/sitter in room Nurse Communication: Mobility status PT Visit Diagnosis: Unsteadiness on feet (R26.81);Muscle weakness (generalized) (M62.81);Difficulty in walking, not elsewhere classified (R26.2)    Time: 1610-9604 PT Time Calculation (min) (ACUTE ONLY): 30 min   Charges:   PT Evaluation $PT Eval Moderate Complexity: 1 Mod PT Treatments $Gait Training: 8-22 mins   PT G Codes:        Laurina Bustle,  PT, DPT Acute Rehabilitation Services  Pager: [phone]   Vanetta Mulders 12/01/2017, 9:19 AM

## 2017-12-01 NOTE — Care Management Obs Status (Signed)
MEDICARE OBSERVATION STATUS NOTIFICATION   Patient Details  Name: [name] MRN: [id number] Date of Birth: 12/21/1945   Medicare Observation Status Notification Given:  Yes    Angelyna Henderson Naomi, RN 12/01/2017, 11:33 AM 

## 2017-12-01 NOTE — Progress Notes (Signed)
Physical Therapy Treatment Patient Details Name: BRANTLEIGH MIFFLIN MRN: [id number] DOB: [birth year] Today's Date: 12/01/2017    History of Present Illness Pt. is a 72 y.o. F with significant PMH of pacemaker, CKD stage 3, hyperlipidemia, L THA, and L TKA. Now s/p right total knee arthroplasty.     PT Comments    Patient is progressing very well towards their physical therapy goals. Plan for discharge home this afternoon. Session mainly focused on gait training and reviewing HEP. Patient stating, "I haven't been able to walk without pain in so long." Patient ambulated 350 feet with RW. Will benefit from follow up physical therapy to address gait deviations and progress functional strengthening.     Follow Up Recommendations  Follow surgeon's recommendation for DC plan and follow-up therapies     Equipment Recommendations  Rolling walker with 5" wheels    Recommendations for Other Services       Precautions / Restrictions Precautions Precautions: Fall;Knee Precaution Booklet Issued: Yes (comment) Restrictions Weight Bearing Restrictions: Yes RLE Weight Bearing: Weight bearing as tolerated    Mobility  Bed Mobility Overal bed mobility: Modified Independent             General bed mobility comments: Modified independent with supine to sit  Transfers Overall transfer level: Needs assistance Equipment used: Rolling walker (2 wheeled) Transfers: Sit to/from Stand Sit to Stand: Supervision         General transfer comment: Supervision for safety from bed. VC's for hand placement.   Ambulation/Gait Ambulation/Gait assistance: Supervision Ambulation Distance (Feet): 350 Feet Assistive device: Rolling walker (2 wheeled) Gait Pattern/deviations: Step-through pattern;Decreased weight shift to right;Decreased step length - left Gait velocity: 1.4 ft/s Gait velocity interpretation: 1.31 - 2.62 ft/sec, indicative of limited community ambulator General Gait Details: VC's for  increased right knee flexion with swing phase and decreased right step length   Stairs             Wheelchair Mobility    Modified Rankin (Stroke Patients Only)       Balance Overall balance assessment: Mild deficits observed, not formally tested                                          Cognition Arousal/Alertness: Awake/alert Behavior During Therapy: WFL for tasks assessed/performed Overall Cognitive Status: Within Functional Limits for tasks assessed                                        Exercises Total Joint Exercises Ankle Circles/Pumps: 10 reps;Both;Seated Quad Sets: 10 reps;Both;Seated Towel Squeeze: 10 reps;Seated Heel Slides: 5 reps;Right;Seated Long Arc Quad: Left;15 reps;Seated Goniometric ROM: Seated: 0 degrees extension - 100 degrees flexion Other Exercises Other Exercises: Reviewed timeline for progression to standing exercises    General Comments        Pertinent Vitals/Pain Pain Assessment: No/denies pain    Home Living Family/patient expects to be discharged to:: Private residence Living Arrangements: Alone Available Help at Discharge: Family;Available 24 hours/day Type of Home: House Home Access: Level entry   Home Layout: One level Home Equipment: Emergency planning/management officer - 2 wheels;Cane - single point;Bedside commode;Toilet riser;Grab bars - toilet;Grab bars - tub/shower      Prior Function Level of Independence: Independent with assistive device(s)      Comments: use  of RW   PT Goals (current goals can now be found in the care plan section) Acute Rehab PT Goals Patient Stated Goal: Do aquatic therapy PT Goal Formulation: With patient Time For Goal Achievement: 12/15/17 Progress towards PT goals: Progressing toward goals    Frequency    7X/week      PT Plan Current plan remains appropriate    Co-evaluation              AM-PAC PT "6 Clicks" Daily Activity  Outcome Measure   Difficulty turning over in bed (including adjusting bedclothes, sheets and blankets)?: None Difficulty moving from lying on back to sitting on the side of the bed? : None Difficulty sitting down on and standing up from a chair with arms (e.g., wheelchair, bedside commode, etc,.)?: A Little Help needed moving to and from a bed to chair (including a wheelchair)?: A Little Help needed walking in hospital room?: A Little Help needed climbing 3-5 steps with a railing? : A Little 6 Click Score: 20    End of Session Equipment Utilized During Treatment: Gait belt Activity Tolerance: Patient tolerated treatment well Patient left: in chair;with call bell/phone within reach;with nursing/sitter in room Nurse Communication: Mobility status PT Visit Diagnosis: Unsteadiness on feet (R26.81);Muscle weakness (generalized) (M62.81);Difficulty in walking, not elsewhere classified (R26.2)     Time: 1610-9604 PT Time Calculation (min) (ACUTE ONLY): 12 min  Charges:  $Gait Training: 8-22 mins                    G Codes:       Laurina Bustle, PT, DPT Acute Rehabilitation Services  Pager: [phone]    Vanetta Mulders 12/01/2017, 12:11 PM

## 2017-12-01 NOTE — Care Management CC44 (Signed)
Condition Code 44 Documentation Completed  Patient Details  Name: [name] MRN: [id number] Date of Birth: 1945-08-10   Condition Code 44 given:  Yes Patient signature on Condition Code 44 notice:  Yes Documentation of 2 MD's agreement:  Yes Code 44 added to claim:  Yes    Durenda Guthrie, RN 12/01/2017, 11:33 AM

## 2017-12-01 NOTE — Care Management Obs Status (Signed)
MEDICARE OBSERVATION STATUS NOTIFICATION   Patient Details  Name: [name] MRN: [id number] Date of Birth: 07/13/1946   Medicare Observation Status Notification Given:  Yes    Durenda Guthrie, RN 12/01/2017, 11:33 AM

## 2017-12-01 NOTE — Progress Notes (Signed)
PATIENT ID: [name]  MRN: [id number]  DOB/AGE:  73-Apr-1947 / 72 y.o.  1 Day Post-Op Procedure(s) (LRB): TOTAL KNEE ARTHROPLASTY (Right)    PROGRESS NOTE Subjective: Patient is alert, oriented, no Nausea, no Vomiting, yes passing gas. Taking PO well. Denies SOB, Chest or Calf Pain. Using Incentive Spirometer, PAS in place. Ambulate WBAT, Patient reports pain as 2/10 .    Objective: Vital signs in last 24 hours: Vitals:   11/30/17 1635 11/30/17 2115 12/01/17 0043 12/01/17 0539  BP: (Abnormal) 124/53 111/60 (Abnormal) 121/51 106/63  Pulse: 99 95 90 74  Resp: Temp: 98.1 F (36.7 C) (Abnormal) 97.5 F (36.4 C) 97.8 F (36.6 C) (Abnormal) 97.5 F (36.4 C)  TempSrc:  Oral Oral Oral  SpO2: 90% 97% 95% 97%  Weight:      Height:          Intake/Output from previous day: I/O last 3 completed shifts: In: 1500 [I.V.:1500] Out: 275 [Urine:200; Blood:75]   Intake/Output this shift: No intake/output data recorded.   LABORATORY DATA: Recent Labs    12/01/17 0350  WBC 16.8*  HGB 11.0*  HCT 34.1*  PLT 312  NA 132*  K 7.0*  CL 101  CO2 21*  BUN 54*  CREATININE 2.23*  GLUCOSE 210*  CALCIUM 8.4*    Examination: Neurologically intact ABD soft Neurovascular intact Sensation intact distally Intact pulses distally Dorsiflexion/Plantar flexion intact Incision: dressing C/D/I No cellulitis present Compartment soft} Blood draw was downstream from IV  Assessment:   1 Day Post-Op Procedure(s) (LRB): TOTAL KNEE ARTHROPLASTY (Right) ADDITIONAL DIAGNOSIS: Expected Acute Blood Loss Anemia, CRF 3,GERD Anticipated LOS equal to or greater than 2 midnights due to - Age 73 and older with one or more of the following:  - Obesity  - Expected need for hospital services (PT, OT, Nursing) required for safe  discharge    - Active co-morbidities: Cardiac Arrhythmia     Plan: PT/OT WBAT, AROM and PROM  DVT Prophylaxis:  SCDx72hrs, ASA 325 mg BID x 2 weeks DISCHARGE  PLAN: Home, repeat BMET DISCHARGE NEEDS: HHPT, Walker and 3-in-1 comode seat     Nestor Lewandowsky 12/01/2017, 8:00 AM

## 2017-12-01 NOTE — Progress Notes (Signed)
CRITICAL VALUE ALERT  Critical Value: Potassium 7.0  Date & Time Notied:  12/01/2017  Provider Notified: Dr [name]  Orders Received/Actions taken: IV fluids with potassium stopped.

## 2017-12-01 NOTE — Progress Notes (Signed)
Written and verbal discharge instruction provided.  The patient and her daughter verbalizes understanding the discharge instructions.  Patient has a scheduled follow up appointment and prescriptions.

## 2017-12-01 NOTE — Progress Notes (Signed)
Dr Wadie Lessen office notified of the patient requesting to be discharged today.  Awaiting call back

## 2017-12-01 NOTE — Discharge Summary (Signed)
Patient ID: ENVI EAGLESON MRN: [id number] DOB/AGE: August 07, 1945 72 y.o.  Admit date: 11/30/2017 Discharge date: 12/01/2017  Admission Diagnoses:  Principal Problem:   Osteoarthritis of right knee Active Problems:   Primary osteoarthritis of right knee   Primary localized osteoarthritis of right knee   Discharge Diagnoses:  Same  Past Medical History:  Diagnosis Date  . Arthritis    "qwhere" (06/06/2017)  . Chronic bilateral low back pain   . CKD (chronic kidney disease) stage 3, GFR 30-59 ml/min (HCC) 09/21/2012  . Complication of anesthesia    slow to wake up "once" (06/06/2017)  . DDD (degenerative disc disease), cervical   . Dysrhythmia   . Esophageal stricture   . GERD 10/04/2007   Qualifier: Diagnosis of  By: Linford Arnold MD, Santina Evans    . HIATAL HERNIA WITH REFLUX 10/04/2007   Qualifier: Diagnosis of  By: Linford Arnold MD, Santina Evans    . History of hiatal hernia   . History of kidney stones   . Hyperlipidemia 08/10/2010   Qualifier: Diagnosis of  By: Linford Arnold MD, Santina Evans    . Hypertension   . HYPERTENSION, BENIGN ESSENTIAL 04/27/2007   Qualifier: Diagnosis of  By: Linford Arnold MD, Santina Evans    . Impaired fasting glucose 08/10/2010   Qualifier: Diagnosis of  By: Linford Arnold MD, Santina Evans    . Lobar pneumonia (HCC)    "never missed a day of work w/it"  . Other specified cardiac arrhythmias 01/02/2015  . 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 Laurier Nancy, [facility]   . Seborrheic dermatitis 06/12/2014    Surgeries: Procedure(s): TOTAL KNEE ARTHROPLASTY on 11/30/2017   Consultants:   Discharged Condition: Improved  Hospital Course: [name] is an 72 y.o. female who was admitted 11/30/2017 for operative treatment ofOsteoarthritis of right knee. Patient has severe unremitting pain that affects sleep, daily activities, and work/hobbies. After pre-op clearance the patient was taken to the  operating room on 11/30/2017 and underwent  Procedure(s): TOTAL KNEE ARTHROPLASTY.    Patient was given perioperative antibiotics:  Anti-infectives (From admission, onward)   Start     Dose/Rate Route Frequency Ordered Stop   11/30/17 2200  cephALEXin (KEFLEX) capsule 500 mg     500 mg Oral 2 times daily 11/30/17 2018     11/30/17 1029  ceFAZolin (ANCEF) IVPB 2g/100 mL premix     2 g 200 mL/hr over 30 Minutes Intravenous On call to O.R. 11/30/17 1029 11/30/17 1320       Patient was given sequential compression devices, early ambulation, and chemoprophylaxis to prevent DVT.  Patient benefited maximally from hospital stay and there were no complications.    Recent vital signs:  Patient Vitals for the past 24 hrs:  BP Temp Temp src Pulse Resp SpO2  12/01/17 1337 107/79 98.3 F (36.8 C) Oral 96 17 100 %  12/01/17 0539 106/63 (!) 97.5 F (36.4 C) Oral 74 18 97 %  12/01/17 0043 (!) 121/51 97.8 F (36.6 C) Oral 90 16 95 %  11/30/17 2115 111/60 (!) 97.5 F (36.4 C) Oral 95 16 97 %     Recent laboratory studies:  Recent Labs    12/01/17 0350 12/01/17 0837  WBC 16.8*  --   HGB 11.0*  --   HCT 34.1*  --   PLT 312  --   NA 132* 140  K 7.0* 3.2*  CL 101 106  CO2 21* 21*  BUN  54* 5*  CREATININE 2.23* 0.65  GLUCOSE 210* 78  CALCIUM 8.4* 8.5*     Discharge Medications:   Allergies as of 12/01/2017   No Known Allergies     Medication List    TAKE these medications   amLODipine 10 MG tablet Commonly known as:  NORVASC Take 1 tablet (10 mg total) by mouth daily.   aspirin EC 325 MG tablet Take 1 tablet (325 mg total) by mouth 2 (two) times daily.   cephALEXin 500 MG capsule Commonly known as:  KEFLEX Take 500 mg by mouth 2 (two) times daily.   lisinopril-hydrochlorothiazide 20-12.5 MG tablet Commonly known as:  PRINZIDE,ZESTORETIC Take 1 tablet by mouth 2 (two) times daily.   oxyCODONE-acetaminophen 5-325 MG tablet Commonly known as:  PERCOCET/ROXICET Take 1  tablet by mouth every 4 (four) hours as needed for severe pain.   tiZANidine 2 MG tablet Commonly known as:  ZANAFLEX Take 1 tablet (2 mg total) by mouth every 6 (six) hours as needed.            Durable Medical Equipment  (From admission, onward)        Start     Ordered   11/30/17 2019  DME Walker rolling  Once    Question:  Patient needs a walker to treat with the following condition  Answer:  Status post right knee replacement   11/30/17 2018   11/30/17 2019  DME 3 n 1  Once     11/30/17 2018       Discharge Care Instructions  (From admission, onward)        Start     Ordered   12/01/17 0000  Weight bearing as tolerated     12/01/17 1652      Diagnostic Studies: No results found.  Disposition: Discharge disposition: 01-Home or Self Care       Discharge Instructions    Call MD / Call 911   Complete by:  As directed    If you experience chest pain or shortness of breath, CALL 911 and be transported to the hospital emergency room.  If you develope a fever above 101 F, pus (white drainage) or increased drainage or redness at the wound, or calf pain, call your surgeon's office.   Constipation Prevention   Complete by:  As directed    Drink plenty of fluids.  Prune juice may be helpful.  You may use a stool softener, such as Colace (over the counter) 100 mg twice a day.  Use MiraLax (over the counter) for constipation as needed.   Diet - low sodium heart healthy   Complete by:  As directed    Driving restrictions   Complete by:  As directed    No driving for 2 weeks   Increase activity slowly as tolerated   Complete by:  As directed    Patient may shower   Complete by:  As directed    You may shower without a dressing once there is no drainage.  Do not wash over the wound.  If drainage remains, cover wound with plastic wrap and then shower.   Weight bearing as tolerated   Complete by:  As directed       Follow-up Information    [name], MD In 2  weeks.   Specialty:  Orthopedic Surgery Contact information: [address] [place] [address] [phone]            Signed: Dannielle Burn 12/01/2017, 4:52 PM

## 2017-12-07 ENCOUNTER — Emergency Department (HOSPITAL_BASED_OUTPATIENT_CLINIC_OR_DEPARTMENT_OTHER): Payer: Medicare Other

## 2017-12-07 ENCOUNTER — Encounter (HOSPITAL_BASED_OUTPATIENT_CLINIC_OR_DEPARTMENT_OTHER): Payer: Self-pay

## 2017-12-07 ENCOUNTER — Other Ambulatory Visit: Payer: Self-pay

## 2017-12-07 ENCOUNTER — Emergency Department (HOSPITAL_BASED_OUTPATIENT_CLINIC_OR_DEPARTMENT_OTHER)
Admission: EM | Admit: 2017-12-07 | Discharge: 2017-12-07 | Disposition: A | Discharge status: Home / Self Care | Payer: Medicare Other | Attending: Emergency Medicine | Admitting: Emergency Medicine

## 2017-12-07 DIAGNOSIS — I129 Hypertensive chronic kidney disease with stage 1 through stage 4 chronic kidney disease, or unspecified chronic kidney disease: Secondary | ICD-10-CM | POA: Insufficient documentation

## 2017-12-07 DIAGNOSIS — N183 Chronic kidney disease, stage 3 (moderate): Secondary | ICD-10-CM | POA: Insufficient documentation

## 2017-12-07 DIAGNOSIS — R1010 Upper abdominal pain, unspecified: Secondary | ICD-10-CM

## 2017-12-07 DIAGNOSIS — Z950 Presence of cardiac pacemaker: Secondary | ICD-10-CM | POA: Insufficient documentation

## 2017-12-07 DIAGNOSIS — Z79899 Other long term (current) drug therapy: Secondary | ICD-10-CM | POA: Insufficient documentation

## 2017-12-07 DIAGNOSIS — Z7982 Long term (current) use of aspirin: Secondary | ICD-10-CM | POA: Insufficient documentation

## 2017-12-07 DIAGNOSIS — Z96642 Presence of left artificial hip joint: Secondary | ICD-10-CM | POA: Diagnosis not present

## 2017-12-07 DIAGNOSIS — Z96653 Presence of artificial knee joint, bilateral: Secondary | ICD-10-CM | POA: Diagnosis not present

## 2017-12-07 DIAGNOSIS — R1013 Epigastric pain: Secondary | ICD-10-CM | POA: Diagnosis present

## 2017-12-07 LAB — URINALYSIS, ROUTINE W REFLEX MICROSCOPIC
Bilirubin Urine: NEGATIVE
Glucose, UA: NEGATIVE mg/dL
Hgb urine dipstick: NEGATIVE
KETONES UR: NEGATIVE mg/dL
Leukocytes, UA: NEGATIVE
NITRITE: NEGATIVE
PROTEIN: NEGATIVE mg/dL
Specific Gravity, Urine: 1.01 (ref 1.005–1.030)
pH: 6 (ref 5.0–8.0)

## 2017-12-07 LAB — CBC WITH DIFFERENTIAL/PLATELET
BASOS ABS: 0 10*3/uL (ref 0.0–0.1)
Basophils Relative: 0 %
EOS ABS: 0.8 10*3/uL — AB (ref 0.0–0.7)
Eosinophils Relative: 5 %
HCT: 37.1 % (ref 36.0–46.0)
Hemoglobin: 12.7 g/dL (ref 12.0–15.0)
Lymphocytes Relative: 14 %
Lymphs Abs: 2.3 10*3/uL (ref 0.7–4.0)
MCH: 30.8 pg (ref 26.0–34.0)
MCHC: 34.2 g/dL (ref 30.0–36.0)
MCV: 89.8 fL (ref 78.0–100.0)
MONO ABS: 1.3 10*3/uL — AB (ref 0.1–1.0)
Monocytes Relative: 8 %
NEUTROS PCT: 73 %
Neutro Abs: 11.8 10*3/uL — ABNORMAL HIGH (ref 1.7–7.7)
PLATELETS: 439 10*3/uL — AB (ref 150–400)
RBC: 4.13 MIL/uL (ref 3.87–5.11)
RDW: 14.2 % (ref 11.5–15.5)
WBC: 16.2 10*3/uL — AB (ref 4.0–10.5)

## 2017-12-07 LAB — COMPREHENSIVE METABOLIC PANEL
ALBUMIN: 3.6 g/dL (ref 3.5–5.0)
ALK PHOS: 32 U/L — AB (ref 38–126)
ALT: 16 U/L (ref 14–54)
ANION GAP: 13 (ref 5–15)
AST: 18 U/L (ref 15–41)
BILIRUBIN TOTAL: 0.5 mg/dL (ref 0.3–1.2)
BUN: 34 mg/dL — ABNORMAL HIGH (ref 6–20)
CALCIUM: 9.3 mg/dL (ref 8.9–10.3)
CO2: 21 mmol/L — ABNORMAL LOW (ref 22–32)
Chloride: 100 mmol/L — ABNORMAL LOW (ref 101–111)
Creatinine, Ser: 1.53 mg/dL — ABNORMAL HIGH (ref 0.44–1.00)
GFR, EST AFRICAN AMERICAN: 38 mL/min — AB (ref >60)
GFR, EST NON AFRICAN AMERICAN: 33 mL/min — AB (ref >60)
Glucose, Bld: 107 mg/dL — ABNORMAL HIGH (ref 65–99)
Potassium: 5.3 mmol/L — ABNORMAL HIGH (ref 3.5–5.1)
Sodium: 134 mmol/L — ABNORMAL LOW (ref 135–145)
TOTAL PROTEIN: 7.2 g/dL (ref 6.5–8.1)

## 2017-12-07 LAB — TROPONIN I

## 2017-12-07 LAB — LIPASE, BLOOD: Lipase: 42 U/L (ref 11–51)

## 2017-12-07 MED ORDER — OXYCODONE-ACETAMINOPHEN 5-325 MG PO TABS: 1.0000 | ORAL_TABLET | ORAL | 0 refills | Status: DC | PRN

## 2017-12-07 MED ORDER — ONDANSETRON HCL 4 MG/2ML IJ SOLN
4.0000 mg | Freq: Once | INTRAMUSCULAR | Status: AC
  Administered 2017-12-07: 4 mg via INTRAVENOUS
  Filled 2017-12-07: qty 2

## 2017-12-07 MED ORDER — MORPHINE SULFATE (PF) 4 MG/ML IV SOLN
4.0000 mg | Freq: Once | INTRAVENOUS | Status: AC
  Administered 2017-12-07: 4 mg via INTRAVENOUS
  Filled 2017-12-07: qty 1

## 2017-12-07 MED ORDER — SODIUM CHLORIDE 0.9 % IV BOLUS
1000.0000 mL | Freq: Once | INTRAVENOUS | Status: AC
  Administered 2017-12-07: 1000 mL via INTRAVENOUS

## 2017-12-07 NOTE — ED Notes (Signed)
Ice chips provided.

## 2017-12-07 NOTE — ED Provider Notes (Signed)
[facility] EMERGENCY DEPARTMENT Provider Note   CSN: [id number] Arrival date & time: 12/07/17  1727     History   Chief Complaint Chief Complaint  Patient presents with  . Abdominal Pain    HPI [name] is a 72 y.o. female.  HPI  72 year old female presents with abdominal pain and back pain.  Pain started on 5/6.  Has been constant.  The pain is an aching sensation and is both in the back and in the epigastrium.  She had a couple episodes of vomiting and has been nauseated.  She is also been constipated since she had surgery last week on her knee.  No fever, chest pain, shortness of breath.  No urinary symptoms.  Pain is severe.  She is tried her oxycodone every 4 hours with no relief.  Past Medical History:  Diagnosis Date  . Arthritis    "qwhere" (06/06/2017)  . Chronic bilateral low back pain   . CKD (chronic kidney disease) stage 3, GFR 30-59 ml/min (HCC) 09/21/2012  . Complication of anesthesia    slow to wake up "once" (06/06/2017)  . DDD (degenerative disc disease), cervical   . Dysrhythmia   . Esophageal stricture   . GERD 10/04/2007   Qualifier: Diagnosis of  By: Linford Arnold MD, Santina Evans    . HIATAL HERNIA WITH REFLUX 10/04/2007   Qualifier: Diagnosis of  By: Linford Arnold MD, Santina Evans    . History of hiatal hernia   . History of kidney stones   . Hyperlipidemia 08/10/2010   Qualifier: Diagnosis of  By: Linford Arnold MD, Santina Evans    . Hypertension   . HYPERTENSION, BENIGN ESSENTIAL 04/27/2007   Qualifier: Diagnosis of  By: Linford Arnold MD, Santina Evans    . Impaired fasting glucose 08/10/2010   Qualifier: Diagnosis of  By: Linford Arnold MD, Santina Evans    . Lobar pneumonia (HCC)    "never missed a day of work w/it"  . Other specified cardiac arrhythmias 01/02/2015  . 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 Laurier Nancy, [facility]   . Seborrheic dermatitis 06/12/2014    Patient  Active Problem List   Diagnosis Date Noted  . Primary localized osteoarthritis of right knee 12/01/2017  . Primary osteoarthritis of right knee 11/30/2017  . Osteoarthritis of right knee 11/29/2017  . Primary osteoarthritis of left hip 06/06/2017  . Osteoarthritis of left hip 06/03/2017  . Preop cardiovascular exam 05/30/2017  . Other specified cardiac arrhythmias 01/02/2015  . Seborrheic dermatitis 06/12/2014  . Pacemaker 09/21/2012  . CKD (chronic kidney disease) stage 3, GFR 30-59 ml/min (HCC) 09/21/2012  . Hyperlipidemia 08/10/2010  . Impaired fasting glucose 08/10/2010  . GERD 10/04/2007  . HIATAL HERNIA WITH REFLUX 10/04/2007  . 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 ABDOMINAL HYSTERECTOMY    . BACK SURGERY    . BLADDER SUSPENSION    . CARPAL TUNNEL RELEASE Bilateral   . CYSTOSCOPY W/ STONE MANIPULATION    . DILATION AND CURETTAGE OF UTERUS    . ESOPHAGOGASTRODUODENOSCOPY (EGD) WITH ESOPHAGEAL DILATION  X 1  . GUM SURGERY     ramus frame implant lower gum  . INSERT / REPLACE / REMOVE PACEMAKER  08/2012  . JOINT REPLACEMENT    . LUMBAR DISC SURGERY  X 2  . TARSAL TUNNEL RELEASE Bilateral   . TONSILLECTOMY    .  TOTAL HIP ARTHROPLASTY Left 06/06/2017  . TOTAL HIP ARTHROPLASTY Left 06/06/2017   Procedure: TOTAL HIP ARTHROPLASTY ANTERIOR APPROACH;  Surgeon: [name], MD;  Location: [facility] OR;  Service: Orthopedics;  Laterality: Left;  . TOTAL KNEE ARTHROPLASTY Left 2006  . TOTAL KNEE ARTHROPLASTY Right 11/30/2017   Procedure: TOTAL KNEE ARTHROPLASTY;  Surgeon: [name], MD;  Location: [facility] OR;  Service: Orthopedics;  Laterality: Right;  . TOTAL KNEE REVISION Left 2007   new hardware  due to staph infection, on chronic keflex  . TRANSTHORACIC ECHOCARDIOGRAM     08/14/12 [facility]): NL LVF, no segmental abnormality, borderline LVH, mild MR, mild MR, trace TR     OB History     None      Home Medications    Prior to Admission medications   Medication Sig Start Date End Date Taking? Authorizing Provider  amLODipine (NORVASC) 10 MG tablet Take 1 tablet (10 mg total) by mouth daily. 11/23/17   Agapito Games, MD  aspirin EC 325 MG tablet Take 1 tablet (325 mg total) by mouth 2 (two) times daily. 11/30/17   Allena Katz, PA-C  cephALEXin (KEFLEX) 500 MG capsule Take 500 mg by mouth 2 (two) times daily.    [provider]  lisinopril-hydrochlorothiazide (PRINZIDE,ZESTORETIC) 20-12.5 MG tablet Take 1 tablet by mouth 2 (two) times daily. 11/23/17   Agapito Games, MD  oxyCODONE-acetaminophen (PERCOCET) 5-325 MG tablet Take 1-2 tablets by mouth every 4 (four) hours as needed for severe pain. 12/07/17   Pricilla Loveless, MD  oxyCODONE-acetaminophen (PERCOCET/ROXICET) 5-325 MG tablet Take 1 tablet by mouth every 4 (four) hours as needed for severe pain. 11/30/17   Allena Katz, PA-C  tiZANidine (ZANAFLEX) 2 MG tablet Take 1 tablet (2 mg total) by mouth every 6 (six) hours as needed. 11/30/17   Allena Katz, PA-C    Family History Family History  Problem Relation Age of Onset  . Heart attack Mother   . Diabetes Mother 6  . Hypertension Mother   . Hypertension Father     Social History Social History   Tobacco Use  . Smoking status: Never Smoker  . Smokeless tobacco: Never Used  Substance Use Topics  . Alcohol use: No  . Drug use: No     Allergies   Patient has no known allergies.   Review of Systems Review of Systems  Constitutional: Negative for fever.  Respiratory: Negative for shortness of breath.   Cardiovascular: Negative for chest pain.  Gastrointestinal: Positive for abdominal pain, constipation, nausea and vomiting.  Genitourinary: Negative for dysuria.  Musculoskeletal: Positive for back pain.  All other systems reviewed and are negative.    Physical Exam Updated Vital Signs BP (!) 115/91   Pulse 85   Temp  98.2 F (36.8 C) (Oral)   Resp 13   Ht  (1.6 m)   Wt 88.5 kg (195 lb)   SpO2 98%   BMI 34.54 kg/m   Physical Exam  Constitutional: She is oriented to person, place, and time. She appears well-developed and well-nourished.  HENT:  Head: Normocephalic and atraumatic.  Right Ear: External ear normal.  Left Ear: External ear normal.  Nose: Nose normal.  Eyes: Right eye exhibits no discharge. Left eye exhibits no discharge.  Cardiovascular: Normal rate, regular rhythm and normal heart sounds.  Pulmonary/Chest: Effort normal and breath sounds normal.  Abdominal: Soft. There is tenderness in the epigastric area and left upper quadrant. There is CVA tenderness (  bilateral).  Musculoskeletal:       Thoracic back: She exhibits no bony tenderness.       Lumbar back: She exhibits no bony tenderness.  Neurological: She is alert and oriented to person, place, and time.  Skin: Skin is warm and dry.  Nursing note and vitals reviewed.    ED Treatments / Results  Labs (all labs ordered are listed, but only abnormal results are displayed) Labs Reviewed  COMPREHENSIVE METABOLIC PANEL - Abnormal; Notable for the following components:      Result Value   Sodium 134 (*)    Potassium 5.3 (*)    Chloride 100 (*)    CO2 21 (*)    Glucose, Bld 107 (*)    BUN 34 (*)    Creatinine, Ser 1.53 (*)    Alkaline Phosphatase 32 (*)    GFR calc non Af Amer 33 (*)    GFR calc Af Amer 38 (*)    All other components within normal limits  CBC WITH DIFFERENTIAL/PLATELET - Abnormal; Notable for the following components:   WBC 16.2 (*)    Platelets 439 (*)    Neutro Abs 11.8 (*)    Monocytes Absolute 1.3 (*)    Eosinophils Absolute 0.8 (*)    All other components within normal limits  LIPASE, BLOOD  URINALYSIS, ROUTINE W REFLEX MICROSCOPIC  TROPONIN I    EKG EKG Interpretation  Date/Time:  Wednesday Dec 07 2017 17:44:13 EDT Ventricular Rate:  105 PR Interval:  114 QRS Duration: 142 QT  Interval:  362 QTC Calculation: 478 R Axis:   1 Text Interpretation:  Atrial-sensed ventricular-paced rhythm Abnormal ECG Confirmed by Pricilla Loveless [phone]) on 12/07/2017 5:59:49 PM   Radiology Ct Abdomen Pelvis Wo Contrast  Result Date: 12/07/2017 CLINICAL DATA:  72 year old with epigastric and generalized back pain. Chronic kidney disease. EXAM: CT ABDOMEN AND PELVIS WITHOUT CONTRAST TECHNIQUE: Multidetector CT imaging of the abdomen and pelvis was performed following the standard protocol without IV contrast. COMPARISON:  None. FINDINGS: Lower chest: Cardiac pacer wires in the right heart. Mild atelectasis or scarring at the medial right lung base. No large pleural effusions. Hepatobiliary: Normal appearance of the liver and gallbladder. Pancreas: Normal appearance of the pancreas without inflammation or duct dilatation. Spleen: Normal appearance of spleen without enlargement. Adrenals/Urinary Tract: Normal adrenal glands. Low-density structures scattered throughout both kidneys and findings are compatible with cortical and parapelvic cysts. There is at least 1 indeterminate right renal structure which is hyperdense and measures up to 1 cm in the mid posterior right kidney. No definite kidney stones. There is no significant hydronephrosis although difficult to exclude fullness in the left renal collecting system due to the parapelvic cysts. Stomach/Bowel: Stomach is within normal limits. Normal appearance of small bowel without obstruction. Small colonic diverticula involving the descending colon. No acute bowel inflammation. Vascular/Lymphatic: Atherosclerotic calcifications in the abdominal aorta without aneurysm. No significant lymph node enlargement. Reproductive: Status post hysterectomy. No adnexal masses. Other: No free fluid.  Negative for free air. Musculoskeletal: There is a left hip arthroplasty that is located. There is severe facet disease in the lumbar spine with mild anterolisthesis at  L5-S1 secondary to facet disease. Disc space narrowing at L5-S1 and L1-L2. Vacuum disc phenomenon at L3-L4 and L4-L5. Narrowing of the central canal in the lower lumbar spine related to the severe degenerative disease. IMPRESSION: No acute abnormality in the abdomen or pelvis. Bilateral renal cysts. Multiple parapelvic cysts which makes it difficult to evaluate for hydronephrosis.  No significant dilatation of the ureters and hydronephrosis is thought to be unlikely. However, the kidneys could be further characterized with a renal ultrasound. There is at least 1 indeterminate right renal lesion measuring up to 1 cm and hyperdense. This could represent a proteinaceous or hemorrhagic cyst but indeterminate. Severe multilevel degenerative disease in the lumbar spine. Electronically Signed   By: Richarda Overlie M.D.   On: 12/07/2017 20:13    Procedures Procedures (including critical care time)  Medications Ordered in ED Medications  sodium chloride 0.9 % bolus 1,000 mL (0 mLs Intravenous Stopped 12/07/17 1932)  ondansetron (ZOFRAN) injection 4 mg (4 mg Intravenous Given 12/07/17 1823)  morphine 4 MG/ML injection 4 mg (4 mg Intravenous Given 12/07/17 1823)  sodium chloride 0.9 % bolus 1,000 mL (0 mLs Intravenous Stopped 12/07/17 2123)     Initial Impression / Assessment and Plan / ED Course  I have reviewed the triage vital signs and the nursing notes.  Pertinent labs & imaging results that were available during my care of the patient were reviewed by me and considered in my medical decision making (see chart for details).     No clear cause for the patient's abdominal/back pain.  CT obtained and was limited due to no contrast.  However there is no obvious acute abdominal pathology.  At first it appears the patient has an acute kidney injury as her creatinine of 1.5 is higher than 0.6 about 1 week ago.  However on review of multiple prior creatinines her typical creatinine is around 1.5.  0.6 is the atypical  finding for her.  Thus I do not think that this represents acute kidney injury.  Her ECG is not normal but she does not have ACS-like symptoms and a negative troponin.  She now feels dramatically better after morphine, Zofran, and fluids.  I think dehydration was a component but not to the point  of renal failure.  At this point, I will give her a small amount of Percocet as she is almost out from her knee.  Her knee is mildly swollen but this appears to be postop and does not appear to have an acute infection.  We discussed need for follow-up with PCP and she already has an appointment tomorrow.  We discussed return precautions but she appears stable for discharge home at this time.  Final Clinical Impressions(s) / ED Diagnoses   Final diagnoses:  Upper abdominal pain    ED Discharge Orders        Ordered    oxyCODONE-acetaminophen (PERCOCET) 5-325 MG tablet  Every 4 hours PRN     12/07/17 2112       Pricilla Loveless, MD 12/08/17 0008

## 2017-12-07 NOTE — ED Triage Notes (Signed)
Pt c/o epigastric pain and generalized back pain worsening since Monday with weakness, nausea and dizziness, daughter is speaking for patient, pt had a knee replacement on 11/30/17, daughter states surgical site looks normal

## 2017-12-07 NOTE — ED Notes (Signed)
Patient assisted up to bedside commode.

## 2017-12-07 NOTE — Discharge Instructions (Addendum)
Follow-up with your doctor tomorrow as scheduled.  If you develop worsening pain in your back or abdomen or recurrent vomiting or any other new or concerning symptoms such as chest pain or shortness of breath, return to the ER immediately.

## 2017-12-08 ENCOUNTER — Ambulatory Visit: Payer: Medicare Other | Admitting: Family Medicine

## 2017-12-09 DIAGNOSIS — K251 Acute gastric ulcer with perforation: Secondary | ICD-10-CM | POA: Insufficient documentation

## 2017-12-16 ENCOUNTER — Other Ambulatory Visit: Payer: Self-pay | Admitting: Family Medicine

## 2017-12-16 ENCOUNTER — Other Ambulatory Visit: Payer: Self-pay | Admitting: *Deleted

## 2017-12-20 MED ORDER — APIXABAN 2.5 MG PO TABS
2.50 | ORAL_TABLET | ORAL | Status: DC
Start: 2017-12-19 — End: 2017-12-20

## 2017-12-20 MED ORDER — LORAZEPAM 1 MG PO TABS
1.00 | ORAL_TABLET | ORAL | Status: DC
Start: ? — End: 2017-12-20

## 2017-12-20 MED ORDER — AMLODIPINE BESYLATE 5 MG PO TABS
5.00 | ORAL_TABLET | ORAL | Status: DC
Start: 2017-12-20 — End: 2017-12-20

## 2017-12-20 MED ORDER — PANTOPRAZOLE SODIUM 40 MG PO TBEC
40.00 | DELAYED_RELEASE_TABLET | ORAL | Status: DC
Start: 2017-12-19 — End: 2017-12-20

## 2017-12-20 MED ORDER — TIZANIDINE HCL 4 MG PO TABS
2.00 | ORAL_TABLET | ORAL | Status: DC
Start: ? — End: 2017-12-20

## 2017-12-20 MED ORDER — LOPERAMIDE HCL 2 MG PO CAPS
2.00 | ORAL_CAPSULE | ORAL | Status: DC
Start: ? — End: 2017-12-20

## 2017-12-20 MED ORDER — ONDANSETRON HCL 4 MG/2ML IJ SOLN
4.00 | INTRAMUSCULAR | Status: DC
Start: ? — End: 2017-12-20

## 2017-12-20 MED ORDER — LISINOPRIL 5 MG PO TABS
10.00 | ORAL_TABLET | ORAL | Status: DC
Start: 2017-12-20 — End: 2017-12-20

## 2017-12-20 MED ORDER — GABAPENTIN 300 MG PO CAPS
300.00 | ORAL_CAPSULE | ORAL | Status: DC
Start: 2017-12-19 — End: 2017-12-20

## 2017-12-20 MED ORDER — HYDRALAZINE HCL 20 MG/ML IJ SOLN
10.00 | INTRAMUSCULAR | Status: DC
Start: ? — End: 2017-12-20

## 2017-12-20 MED ORDER — HYDROCODONE-ACETAMINOPHEN 5-325 MG PO TABS
1.00 | ORAL_TABLET | ORAL | Status: DC
Start: ? — End: 2017-12-20

## 2017-12-30 ENCOUNTER — Telehealth: Payer: Self-pay

## 2017-12-30 NOTE — Telephone Encounter (Signed)
Misty from [facility] called to let office know that pt was being discharged today.   Mistry requested that we contact Mercer Pod, pt's daughter, at [phone] to schedule pt a rehab follow up appt 1-2 weeks from today with Dr Linford Arnold.   I called Sarah and call went to VM which was not set up to receive messages. Will try to call again later

## 2018-01-02 NOTE — Telephone Encounter (Signed)
Spoke to pt and she said that when she speaks with her daughter, she will make sure to let her know to call office and schedule pt and appt

## 2018-01-02 NOTE — Telephone Encounter (Signed)
Called again, no ans and no VM set up.

## 2018-01-04 NOTE — Telephone Encounter (Signed)
Pt has appt scheduled with Dr Linford ArnoldMetheney to follow up on 01-12-18

## 2018-01-12 ENCOUNTER — Ambulatory Visit: Payer: Medicare Other | Admitting: Family Medicine

## 2018-01-25 ENCOUNTER — Encounter: Payer: Medicare Other | Admitting: Cardiology

## 2018-01-31 ENCOUNTER — Encounter: Payer: Self-pay | Admitting: Family Medicine

## 2018-01-31 ENCOUNTER — Ambulatory Visit: Payer: Medicare Other | Admitting: Family Medicine

## 2018-01-31 ENCOUNTER — Telehealth: Payer: Self-pay | Admitting: Family Medicine

## 2018-01-31 VITALS — BP 138/59 | HR 93 | Ht 65.0 in | Wt 185.0 lb

## 2018-01-31 DIAGNOSIS — D500 Iron deficiency anemia secondary to blood loss (chronic): Secondary | ICD-10-CM

## 2018-01-31 DIAGNOSIS — R6889 Other general symptoms and signs: Secondary | ICD-10-CM | POA: Diagnosis not present

## 2018-01-31 DIAGNOSIS — Z8719 Personal history of other diseases of the digestive system: Secondary | ICD-10-CM

## 2018-01-31 DIAGNOSIS — Z8711 Personal history of peptic ulcer disease: Secondary | ICD-10-CM

## 2018-01-31 DIAGNOSIS — R251 Tremor, unspecified: Secondary | ICD-10-CM

## 2018-01-31 DIAGNOSIS — F5104 Psychophysiologic insomnia: Secondary | ICD-10-CM | POA: Diagnosis not present

## 2018-01-31 MED ORDER — TRAZODONE HCL 50 MG PO TABS: 25.0000 mg | ORAL_TABLET | Freq: Every evening | ORAL | 1 refills | Status: DC | PRN

## 2018-01-31 NOTE — Patient Instructions (Signed)
Iron-Rich Diet Iron is a mineral that helps your body to produce hemoglobin. Hemoglobin is a protein in your red blood cells that carries oxygen to your body's tissues. Eating too little iron may cause you to feel weak and tired, and it can increase your risk for infection. Eating enough iron is necessary for your body's metabolism, muscle function, and nervous system. Iron is naturally found in many foods. It can also be added to foods or fortified in foods. There are two types of dietary iron:  Heme iron. Heme iron is absorbed by the body more easily than nonheme iron. Heme iron is found in meat, poultry, and fish.  Nonheme iron. Nonheme iron is found in dietary supplements, iron-fortified grains, beans, and vegetables.  You may need to follow an iron-rich diet if:  You have been diagnosed with iron deficiency or iron-deficiency anemia.  You have a condition that prevents you from absorbing dietary iron, such as: ? Infection in your intestines. ? Celiac disease. This involves long-lasting (chronic) inflammation of your intestines.  You do not eat enough iron.  You eat a diet that is high in foods that impair iron absorption.  You have lost a lot of blood.  You have heavy bleeding during your menstrual cycle.  You are pregnant.  What is my plan? Your health care provider may help you to determine how much iron you need per day based on your condition. Generally, when a person consumes sufficient amounts of iron in the diet, the following iron needs are met:  Men. ? 14-18 years old: 11 mg per day. ? 19-50 years old: 8 mg per day.  Women. ? 14-18 years old: 15 mg per day. ? 19-50 years old: 18 mg per day. ? Over 50 years old: 8 mg per day. ? Pregnant women: 27 mg per day. ? Breastfeeding women: 9 mg per day.  What do I need to know about an iron-rich diet?  Eat fresh fruits and vegetables that are high in vitamin C along with foods that are high in iron. This will help  increase the amount of iron that your body absorbs from food, especially with foods containing nonheme iron. Foods that are high in vitamin C include oranges, peppers, tomatoes, and mango.  Take iron supplements only as directed by your health care provider. Overdose of iron can be life-threatening. If you were prescribed iron supplements, take them with orange juice or a vitamin C supplement.  Cook foods in pots and pans that are made from iron.  Eat nonheme iron-containing foods alongside foods that are high in heme iron. This helps to improve your iron absorption.  Certain foods and drinks contain compounds that impair iron absorption. Avoid eating these foods in the same meal as iron-rich foods or with iron supplements. These include: ? Coffee, black tea, and red wine. ? Milk, dairy products, and foods that are high in calcium. ? Beans, soybeans, and peas. ? Whole grains.  When eating foods that contain both nonheme iron and compounds that impair iron absorption, follow these tips to absorb iron better. ? Soak beans overnight before cooking. ? Soak whole grains overnight and drain them before using. ? Ferment flours before baking, such as using yeast in bread dough. What foods can I eat? Grains Iron-fortified breakfast cereal. Iron-fortified whole-wheat bread. Enriched rice. Sprouted grains. Vegetables Spinach. Potatoes with skin. Green peas. Broccoli. Red and green bell peppers. Fermented vegetables. Fruits Prunes. Raisins. Oranges. Strawberries. Mango. Grapefruit. Meats and Other Protein Sources   Beef liver. Oysters. Beef. Shrimp. [place]. Chicken. [place]. Sardines. Chickpeas. Nuts. Tofu. Beverages Tomato juice. Fresh orange juice. Prune juice. Hibiscus tea. Fortified instant breakfast shakes. Condiments Tahini. Fermented soy sauce. Sweets and Desserts Black-strap molasses. Other Wheat germ. The items listed above may not be a complete list of recommended foods or beverages.  Contact your dietitian for more options. What foods are not recommended? Grains Whole grains. Bran cereal. Bran flour. Oats. Vegetables Artichokes. Brussels sprouts. Kale. Fruits Blueberries. Raspberries. Strawberries. Figs. Meats and Other Protein Sources Soybeans. Products made from soy protein. Dairy Milk. Cream. Cheese. Yogurt. Cottage cheese. Beverages Coffee. Black tea. Red wine. Sweets and Desserts Cocoa. Chocolate. Ice cream. Other Basil. Oregano. Parsley. The items listed above may not be a complete list of foods and beverages to avoid. Contact your dietitian for more information. This information is not intended to replace advice given to you by your health care provider. Make sure you discuss any questions you have with your health care provider. Document Released: 03/02/2005 Document Revised: 02/06/2016 Document Reviewed: 02/13/2014 Elsevier Interactive Patient Education  [employer].

## 2018-01-31 NOTE — Telephone Encounter (Signed)
Pt called and forgot to ask at her appointment day for a refill of the desonide. Please send refill to [facility]. KG LPN

## 2018-01-31 NOTE — Progress Notes (Signed)
Subjective:    Patient ID: [name], female    DOB: May 15, 1946, 72 y.o.   MRN: [id number]  HPI 72 year old female comes in today to follow-up for recent hospitalization.  She was admitted to [facility] on May 9 and discharged home on May 20.  She initially presented to the emergency department with abdominal pain that have been going on for about 3 days.  She had had a right knee replacement prior to that.  In the ED they did a CT scan confirming a perforated peptic ulcer and she was admitted by the surgical team.  Unfortunately she ended up in the ICU and underwent laparotomy for the perforated ulcer.  She had a prepyloric gastric ulcer repair on May 9.  She did have a PICC line and while hospitalized.  She was started on a clear liquid diet and then advance to full liquids by the time she was discharged home on Protonix twice daily.  She did have her outpatient follow-up with GI on June 10.  Actually scheduled her for an EGD and colonoscopy in August and encouraged her to continue her pantoprazole 40 mg twice a day. He was then discharged to rehab for about 3 weeks.  I do not have notes from rehab.  That she is been home she is been starting to feel little bit better.  She is starting physical therapy with [facility] for her [facility].  And she is planning to start going to the sports center for Silver sneakers to start to get her strength back.  She has noticed a tremor since she was discharged from the hospital.  She says at times it can intensify and make it difficult to eat.  She has also had significant difficulty with sleep.  She says she has not had a good night sleep since she left the hospital.  She says they were giving her medication there but does not really remember what it was.  She did try an over-the-counter prescription which she thinks might of been Benadryl recommended by the pharmacist but says it did not help at all.  She just tosses and turns all night long.  She feels like her  anxiety is affecting her sleep and then when she does not sleep it intensifies her irritability and anxiety.  She is off of chronic pain medication status post surgery.  And her incision is actually healing really well.   Review of Systems  BP (!) 138/59   Pulse 93   Ht 5\' 5"  (1.651 m)   Wt 185 lb (83.9 kg)   SpO2 100% Comment: on RA  BMI 30.79 kg/m     No Known Allergies  Past Medical History:  Diagnosis Date  . Arthritis    "qwhere" (06/06/2017)  . Chronic bilateral low back pain   . CKD (chronic kidney disease) stage 3, GFR 30-59 ml/min (HCC) 09/21/2012  . Complication of anesthesia    slow to wake up "once" (06/06/2017)  . DDD (degenerative disc disease), cervical   . Dysrhythmia   . Esophageal stricture   . GERD 10/04/2007   Qualifier: Diagnosis of  By: Linford Arnold MD, Santina Evans    . HIATAL HERNIA WITH REFLUX 10/04/2007   Qualifier: Diagnosis of  By: Linford Arnold MD, Santina Evans    . History of hiatal hernia   . History of kidney stones   . Hyperlipidemia 08/10/2010   Qualifier: Diagnosis of  By: Linford Arnold MD, Santina Evans    . Hypertension   . HYPERTENSION,  BENIGN ESSENTIAL 04/27/2007   Qualifier: Diagnosis of  By: Linford ArnoldMetheney MD, Santina Evansatherine    . Impaired fasting glucose 08/10/2010   Qualifier: Diagnosis of  By: Linford ArnoldMetheney MD, Santina Evansatherine    . Lobar pneumonia (HCC)    "never missed a day of work w/it"  . Other specified cardiac arrhythmias 01/02/2015  . 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 Laurier NancyZann Tyson, [facility]   . Seborrheic dermatitis 06/12/2014    Past Surgical History:  Procedure Laterality Date  . ABDOMINAL HYSTERECTOMY    . BACK SURGERY    . BLADDER SUSPENSION    . CARPAL TUNNEL RELEASE Bilateral   . CYSTOSCOPY W/ STONE MANIPULATION    . DILATION AND CURETTAGE OF UTERUS    . ESOPHAGOGASTRODUODENOSCOPY (EGD) WITH ESOPHAGEAL DILATION  X 1  . GUM SURGERY     ramus frame implant lower  gum  . INSERT / REPLACE / REMOVE PACEMAKER  08/2012  . JOINT REPLACEMENT    . LUMBAR DISC SURGERY  X 2  . TARSAL TUNNEL RELEASE Bilateral   . TONSILLECTOMY    . TOTAL HIP ARTHROPLASTY Left 06/06/2017  . TOTAL HIP ARTHROPLASTY Left 06/06/2017   Procedure: TOTAL HIP ARTHROPLASTY ANTERIOR APPROACH;  Surgeon: Gean Birchwoodowan, Frank, MD;  Location: [facility] OR;  Service: Orthopedics;  Laterality: Left;  . TOTAL KNEE ARTHROPLASTY Left 2006  . TOTAL KNEE ARTHROPLASTY Right 11/30/2017   Procedure: TOTAL KNEE ARTHROPLASTY;  Surgeon: Gean Birchwoodowan, Frank, MD;  [facility];  Service: Orthopedics;  Laterality: Right;  . TOTAL KNEE REVISION Left 2007   new hardware  due to staph infection, on chronic keflex  . TRANSTHORACIC ECHOCARDIOGRAM     08/14/12 [facility]): NL LVF, no segmental abnormality, borderline LVH, mild MR, mild MR, trace TR    Social History   Socioeconomic History  . Marital status: Widowed    Spouse name: Not on file  . Number of children: Not on file  . Years of education: Not on file  . Highest education level: Not on file  Occupational History  . Not on file  Social Needs  . Financial resource strain: Not on file  . Food insecurity:    Worry: Not on file    Inability: Not on file  . Transportation needs:    Medical: Not on file    Non-medical: Not on file  Tobacco Use  . Smoking status: Never Smoker  . Smokeless tobacco: Never Used  Substance and Sexual Activity  . Alcohol use: No  . Drug use: No  . Sexual activity: Not on file  Lifestyle  . Physical activity:    Days per week: Not on file    Minutes per session: Not on file  . Stress: Not on file  Relationships  . Social connections:    Talks on phone: Not on file    Gets together: Not on file    Attends religious service: Not on file    Active member of club or organization: Not on file    Attends meetings of clubs or organizations: Not on file    Relationship status: Not on file  . Intimate partner violence:     Fear of current or ex partner: Not on file    Emotionally abused: Not on file    Physically abused: Not on file    Forced sexual activity: Not on file  Other Topics Concern  . Not on file  Social History Narrative  . Not on file    Family History  Problem Relation Age of Onset  . Heart attack Mother   . Diabetes Mother 62  . Hypertension Mother   . Hypertension Father     Outpatient Encounter Medications as of 01/31/2018  Medication Sig  . colchicine 0.6 MG tablet Take 1 tablet by mouth 2 (two) times daily.  Marland Kitchen lisinopril (PRINIVIL,ZESTRIL) 2.5 MG tablet Take by mouth. Take 1 or 2 tablets daily  . pantoprazole (PROTONIX) 40 MG tablet Take 1 tablet by mouth 2 (two) times daily.  Marland Kitchen tiZANidine (ZANAFLEX) 2 MG tablet Take 1 tablet (2 mg total) by mouth every 6 (six) hours as needed.  . traZODone (DESYREL) 50 MG tablet Take 0.5-2 tablets (25-100 mg total) by mouth at bedtime as needed for sleep.  . [DISCONTINUED] amLODipine (NORVASC) 10 MG tablet Take 1 tablet (10 mg total) by mouth daily.  . [DISCONTINUED] aspirin EC 325 MG tablet Take 1 tablet (325 mg total) by mouth 2 (two) times daily.  . [DISCONTINUED] celecoxib (CELEBREX) 200 MG capsule   . [DISCONTINUED] cephALEXin (KEFLEX) 500 MG capsule Take 500 mg by mouth 2 (two) times daily.  . [DISCONTINUED] ELIQUIS 2.5 MG TABS tablet   . [DISCONTINUED] lisinopril-hydrochlorothiazide (PRINZIDE,ZESTORETIC) 20-12.5 MG tablet Take 1 tablet by mouth 2 (two) times daily.  . [DISCONTINUED] oxyCODONE-acetaminophen (PERCOCET) 5-325 MG tablet Take 1-2 tablets by mouth every 4 (four) hours as needed for severe pain.   No facility-administered encounter medications on file as of 01/31/2018.          Objective:   Physical Exam  Constitutional: She is oriented to person, place, and time. She appears well-developed and well-nourished.  HENT:  Head: Normocephalic and atraumatic.  Cardiovascular: Normal rate, regular rhythm and normal heart sounds.   Pulmonary/Chest: Effort normal and breath sounds normal.  Neurological: She is alert and oriented to person, place, and time.  Skin: Skin is warm and dry.  Incision is actually really well healed on her abdomen.  The vertical excision from the breastbone down to the bellybutton.  Psychiatric: She has a normal mood and affect. Her behavior is normal.        Assessment & Plan:  Anemia, post op -her hemoglobin was quite low at 8.9 at discharge.  She says it has not been checked since then.  She is had some significant cold intolerance and I suspect that this is most likely the cause.  We will recheck her hemoglobin just to make sure that she is trending upward though I explained to her I do not suspect that she is probably back into the normal range completely.  So we will need to follow this.  She does not eat meat so we discussed some strategies around increasing iron in her diet.  Cold intolerance-likely secondary to postoperative anemia.  We will check her TSH as well that she is never had any previous problems with her thyroid.  Insomnia after recent hospitalization.  Explained that this is not uncommon.  Hopefully we can get her sleep clock reset.  Recommend a trial of trazodone.  We will start with half a tab and then go up to 1 or 2 if needed.  I warned about the potential for daytime sedation and to stop if she is not feeling well on the medication.  Tremor-unclear etiology at this point.  I am hoping that this will improve as she continues to recover from her hospitalization and rehab.  Hopefully she  continues to get stronger and is back into her normal routine and anxiety levels are better controlled this will improve as well.  History of gastric perforated ulcer-healing well and still recovering.  Scheduled for endoscopy and colonoscopy in August.  Time spent 40 minutes, greater than 50% of time spent discussing anemia, cold intolerance, insomnia, tremor, and gastric perforated ulcer.   Chart reviewed from [facility] including discharge summaries and labs and imaging.

## 2018-01-31 NOTE — Telephone Encounter (Signed)
I do not see it on her historical list.  If she can give us a little bit more detail such as the concentration and whether its cream or ointment and/or have [facility] send us a medication refill request.  I do not know that I was the one who actually prescribed it.  Also please just clarify her with her why she is taking it.

## 2018-02-01 MED ORDER — DESONIDE 0.05 % EX CREA: TOPICAL_CREAM | CUTANEOUS | 0 refills | Status: DC

## 2018-02-01 NOTE — Telephone Encounter (Signed)
Found rx on historical list. Pt was given this for a rash on her nose back in 2011. Spoke w/pcp and she approved refill.   [facility]ill sent to [facility].[name], [name] [name], CMA

## 2018-02-09 ENCOUNTER — Telehealth: Payer: Self-pay

## 2018-02-09 NOTE — Telephone Encounter (Signed)
Looks like the lisinopril 2.5 was what she was taking based on the most recent office visit note with Dr. Eppie GibsonMetheny, but that prescription says take 1 or 2 tablets daily but does not mention any parameters for how to decide whether to take 1 or 2 tablets?  Can we confirm how many the patient is taking and can we be sure that she is no longer on the lisinopril-HCTZ?   Blood pressure was okay at last visit, whenever she was taking then, she is still taking it now we can refill it.

## 2018-02-09 NOTE — Telephone Encounter (Signed)
Archie Pattenonya out of office today. Can you please look into this and see if RF is appropriate?   Thanks!

## 2018-02-09 NOTE — Telephone Encounter (Signed)
I tried to call patient, no answer. Dr Linford ArnoldMetheney use to prescribe Lisinopril-HCTZ not Lisinopril 2.5 mg. The Lisinopril 2.5 his 100 % historical provider.

## 2018-02-09 NOTE — Telephone Encounter (Signed)
Called and verified with pt- she has Lisinopril 2.5mg  tablets and is taking one daily. She is not sure if they mentioned changing her BP medication when she was in the hospital because she was "so out of it"  She has 3 tablets left.  Dr Lyn HollingsheadAlexander- can you please help to advise on this ?

## 2018-02-09 NOTE — Telephone Encounter (Signed)
Pt called stating that she was needing a refill on her Lisinopril and it was denied by our office, since her med list noted it was from a historical provider.   Per pt, the "historical provider" listed on her chart was from when she was in the hospital recently. Pt reports that Dr Linford ArnoldMetheney is the only providers who handles her BP.   Wanting a 3 month supply called in to last her until her appt in October.   Tonya, do you know if this RX is ok to be sent in for pt? She has seen Dr Linford ArnoldMetheney since her discharge from hospital.  Thanks!

## 2018-02-10 NOTE — Telephone Encounter (Signed)
I called patient back, there was no answer.

## 2018-02-13 MED ORDER — LISINOPRIL 2.5 MG PO TABS: 2.5000 mg | ORAL_TABLET | Freq: Every day | ORAL | 0 refills | Status: DC

## 2018-02-13 NOTE — Telephone Encounter (Signed)
Med RF sent.  BP check in 2 weeks iwht nurse.

## 2018-02-13 NOTE — Telephone Encounter (Signed)
Called pt- she verified off of bottle for me again that it states for her to just take 1 tab QD of the Lisinopril 2.5 mg. Pt confirmed that there was no HCTZ in this script.   Dr Linford ArnoldMetheney- ok to refill this for pt?  I asked pt if she would be okay to come in for nurse visit BP check in about 2 weeks to make sure this dose is okay and she agreed.   Is this OK? Please advise.  Thanks!

## 2018-02-14 NOTE — Telephone Encounter (Signed)
Pt advised  BP check scheduled for 03-01-18

## 2018-03-01 ENCOUNTER — Ambulatory Visit: Payer: Medicare Other

## 2018-03-03 ENCOUNTER — Ambulatory Visit (INDEPENDENT_AMBULATORY_CARE_PROVIDER_SITE_OTHER): Payer: Medicare Other | Admitting: Physician Assistant

## 2018-03-03 VITALS — BP 107/82 | HR 89 | Wt 178.0 lb

## 2018-03-03 DIAGNOSIS — I10 Essential (primary) hypertension: Secondary | ICD-10-CM | POA: Diagnosis not present

## 2018-03-03 NOTE — Progress Notes (Signed)
   Subjective:    Patient ID: [name], female    DOB: 09/17/1945, 72 y.o.   MRN: [id number]  HPI Patient here in office today for recheck of BP. Patient has no complaints of dizziness, SOB, H/A, palpitations or chest pain. Verified patient medication and she states she is taking the Lisinopril with Amlodipine 10mg . KG LPN   Review of Systems     Objective:   Physical Exam        Assessment & Plan:  Advised patient to return in 2 weeks for recheck of BP and to bring medication bottles with her to confirm dosage of Amlodipine. Patient agrees with plan of care. KG LPN  BP looks good today. Agree with above plan. Tandy GawJade Breeback PA-C

## 2018-03-17 ENCOUNTER — Ambulatory Visit (INDEPENDENT_AMBULATORY_CARE_PROVIDER_SITE_OTHER): Payer: Medicare Other | Admitting: Family Medicine

## 2018-03-17 VITALS — BP 131/46 | HR 91

## 2018-03-17 DIAGNOSIS — I10 Essential (primary) hypertension: Secondary | ICD-10-CM

## 2018-03-17 MED ORDER — TRAZODONE HCL 50 MG PO TABS: 25.0000 mg | ORAL_TABLET | Freq: Every evening | ORAL | 1 refills | Status: DC | PRN

## 2018-03-17 NOTE — Progress Notes (Signed)
Pt came into clinic today for BP check. Pt reports taking her BP Rx's this am about 0800. Denies any negative side effects. She was supposed to bring in Rx bottles with her, she forgot. She also denies flu shot today, states she always gets them in October when she has her annual exam. HM postponed. Pt's BP was reviewed with PCP. No changes. Pt advised to keep appt in Oct and to bring all medications with her. Verbalized understanding.

## 2018-03-17 NOTE — Progress Notes (Signed)
Agree with documentation as above.   Catherine Metheney, MD  

## 2018-03-21 ENCOUNTER — Telehealth: Payer: Self-pay | Admitting: Family Medicine

## 2018-03-21 NOTE — Telephone Encounter (Signed)
PT wanted to know if she can take Biotin/Folic acid to stimulate hair growth. She was worried if she could take them with the current medication.  Please advise.  Contact PT over phone(Home/Daughter's phone)

## 2018-03-22 ENCOUNTER — Telehealth: Payer: Self-pay | Admitting: Family Medicine

## 2018-03-22 NOTE — Telephone Encounter (Signed)
Patient calls and would like to get a complete blood work up done. She did not go and get labs done that were put in last time. States she doesn't feel well and thinks things may be off balance. KG LPN  Please advise

## 2018-03-22 NOTE — Addendum Note (Signed)
Addended by: Deno EtienneBARKLEY, Laquanta Hummel L on: 03/22/2018 02:40 PM   Modules accepted: Orders

## 2018-03-22 NOTE — Telephone Encounter (Signed)
Called and advised of recommendations. She also would like to have some additional labs done since having her surgery she has not felt like herself and reports some swelling going on. she would like to get her labs done. Pt advised that labs were ordered in July (ferritin,tsh,cbc). She feels that she should have some additional labs done along with the ones that have already been ordered. She would like a rtn call once this is done.Will fwd to pcp for review.Loralee PacasBarkley, Naif Alabi AugustaLynetta

## 2018-03-22 NOTE — Telephone Encounter (Signed)
Please have her go for current labs. It will check her for anemia, thyroid, kidney and liver problemsIf she is having balance issues she needs appt

## 2018-03-23 ENCOUNTER — Other Ambulatory Visit: Payer: Self-pay | Admitting: Family Medicine

## 2018-03-23 NOTE — Telephone Encounter (Signed)
Called patient and notified to go to lab to have bloodwork done. KG LPN

## 2018-03-24 ENCOUNTER — Other Ambulatory Visit: Payer: Self-pay | Admitting: *Deleted

## 2018-03-24 ENCOUNTER — Telehealth: Payer: Self-pay | Admitting: Physician Assistant

## 2018-03-24 ENCOUNTER — Telehealth: Payer: Self-pay | Admitting: Family Medicine

## 2018-03-24 DIAGNOSIS — R609 Edema, unspecified: Secondary | ICD-10-CM

## 2018-03-24 DIAGNOSIS — R7989 Other specified abnormal findings of blood chemistry: Secondary | ICD-10-CM

## 2018-03-24 LAB — CBC
HCT: 36.7 % (ref 35.0–45.0)
HEMOGLOBIN: 12 g/dL (ref 11.7–15.5)
MCH: 27.8 pg (ref 27.0–33.0)
MCHC: 32.7 g/dL (ref 32.0–36.0)
MCV: 85.2 fL (ref 80.0–100.0)
MPV: 9 fL (ref 7.5–12.5)
Platelets: 417 10*3/uL — ABNORMAL HIGH (ref 140–400)
RBC: 4.31 10*6/uL (ref 3.80–5.10)
RDW: 14.8 % (ref 11.0–15.0)
WBC: 11.2 10*3/uL — AB (ref 3.8–10.8)

## 2018-03-24 LAB — URIC ACID: Uric Acid, Serum: 7.8 mg/dL — ABNORMAL HIGH (ref 2.5–7.0)

## 2018-03-24 LAB — COMPLETE METABOLIC PANEL WITH GFR
AG RATIO: 1.3 (calc) (ref 1.0–2.5)
ALT: 13 U/L (ref 6–29)
AST: 12 U/L (ref 10–35)
Albumin: 3.7 g/dL (ref 3.6–5.1)
Alkaline phosphatase (APISO): 34 U/L (ref 33–130)
BUN/Creatinine Ratio: 17 (calc) (ref 6–22)
BUN: 23 mg/dL (ref 7–25)
CALCIUM: 9.4 mg/dL (ref 8.6–10.4)
CO2: 27 mmol/L (ref 20–32)
Chloride: 101 mmol/L (ref 98–110)
Creat: 1.36 mg/dL — ABNORMAL HIGH (ref 0.60–0.93)
GFR, EST AFRICAN AMERICAN: 45 mL/min/{1.73_m2} — AB (ref >=60)
GFR, EST NON AFRICAN AMERICAN: 39 mL/min/{1.73_m2} — AB (ref >=60)
GLOBULIN: 2.8 g/dL (ref 1.9–3.7)
Glucose, Bld: 127 mg/dL — ABNORMAL HIGH (ref 65–99)
POTASSIUM: 4.6 mmol/L (ref 3.5–5.3)
SODIUM: 137 mmol/L (ref 135–146)
TOTAL PROTEIN: 6.5 g/dL (ref 6.1–8.1)
Total Bilirubin: 0.5 mg/dL (ref 0.2–1.2)

## 2018-03-24 LAB — FERRITIN: Ferritin: 199 ng/mL (ref 16–288)

## 2018-03-24 LAB — FOLATE: Folate: 9.1 ng/mL

## 2018-03-24 LAB — TSH: TSH: 0.56 m[IU]/L (ref 0.40–4.50)

## 2018-03-24 MED ORDER — FUROSEMIDE 20 MG PO TABS: 20.0000 mg | ORAL_TABLET | Freq: Two times a day (BID) | ORAL | 0 refills | Status: DC

## 2018-03-24 NOTE — Telephone Encounter (Signed)
Patient calls office wanting to know if she can have something to help with the swelling of both her feet. Please advise. KG LPN

## 2018-03-24 NOTE — Telephone Encounter (Signed)
Received after hours call from patient's daughter regarding gradually worsening pitting edema of the feet and ankles bilaterally x 3 days. Swelling is symmetric. Daughter states swelling is causing pain, interfering with sleep and difficulty ambulating. Denies dyspnea, wheezing, chest pain, claudication.  Lab Results  Component Value Date   CREATININE 1.36 (H) 03/23/2018   BUN 23 03/23/2018   NA 137 03/23/2018   K 4.6 03/23/2018   CL 101 03/23/2018   CO2 27 03/23/2018   Personally reviewed recent labs from 8/22 and reviewed results with patient's daughter  CKD is stable with Scr of 1.36. No history of CHF. No prior Echo or BNP. Reasons for swelling may include knee OA and Amlodipine in this setting. Starting Lasix 20 mg bid for the next 2-3 days until patient can be re-evaluated in the office. Encouraged to elevate lower extremities. ED precautions discussed with patient's daughter including fever, dyspnea, chest pain and worsening edema.

## 2018-03-27 ENCOUNTER — Telehealth: Payer: Self-pay

## 2018-03-27 NOTE — Telephone Encounter (Signed)
[name] sent in lasix over the weekend. Pt needs f/u scheduled this week to evalu why swelling.

## 2018-03-27 NOTE — Telephone Encounter (Signed)
Called pt- her [facility] called [facility]

## 2018-03-27 NOTE — Telephone Encounter (Signed)
Pt called to schedule her follow up appt to discuss swelling, as she was advised to do by Dr Linford ArnoldMetheney and Vinetta Bergamoharley.   Pt only available to get transportation on Friday and wants to know if it is okay for her to wait until then to be evaluated. If so, is it ok for me to put her in acute spot?

## 2018-03-27 NOTE — Telephone Encounter (Signed)
Pt has an appt tomorrow.[name].[name], [name]

## 2018-03-27 NOTE — Telephone Encounter (Signed)
Ok to schedule for Friday.

## 2018-03-28 ENCOUNTER — Ambulatory Visit (INDEPENDENT_AMBULATORY_CARE_PROVIDER_SITE_OTHER): Payer: Medicare Other

## 2018-03-28 ENCOUNTER — Encounter: Payer: Self-pay | Admitting: Physician Assistant

## 2018-03-28 ENCOUNTER — Ambulatory Visit (INDEPENDENT_AMBULATORY_CARE_PROVIDER_SITE_OTHER): Payer: Medicare Other | Admitting: Physician Assistant

## 2018-03-28 VITALS — BP 119/69 | HR 94 | Temp 98.2°F | Wt 173.0 lb

## 2018-03-28 DIAGNOSIS — I10 Essential (primary) hypertension: Secondary | ICD-10-CM | POA: Diagnosis not present

## 2018-03-28 DIAGNOSIS — M25571 Pain in right ankle and joints of right foot: Secondary | ICD-10-CM | POA: Diagnosis not present

## 2018-03-28 DIAGNOSIS — M25471 Effusion, right ankle: Secondary | ICD-10-CM

## 2018-03-28 DIAGNOSIS — M119 Crystal arthropathy, unspecified: Secondary | ICD-10-CM | POA: Insufficient documentation

## 2018-03-28 DIAGNOSIS — M6580 Other synovitis and tenosynovitis, unspecified site: Secondary | ICD-10-CM

## 2018-03-28 DIAGNOSIS — R609 Edema, unspecified: Secondary | ICD-10-CM | POA: Diagnosis not present

## 2018-03-28 DIAGNOSIS — E790 Hyperuricemia without signs of inflammatory arthritis and tophaceous disease: Secondary | ICD-10-CM

## 2018-03-28 DIAGNOSIS — N183 Chronic kidney disease, stage 3 unspecified: Secondary | ICD-10-CM

## 2018-03-28 DIAGNOSIS — M79671 Pain in right foot: Secondary | ICD-10-CM

## 2018-03-28 MED ORDER — HYDROCHLOROTHIAZIDE 12.5 MG PO TABS: 12.5000 mg | ORAL_TABLET | Freq: Every day | ORAL | 0 refills | Status: DC

## 2018-03-28 MED ORDER — AMLODIPINE BESYLATE 5 MG PO TABS: 5.0000 mg | ORAL_TABLET | Freq: Every day | ORAL | 0 refills | Status: DC

## 2018-03-28 NOTE — Assessment & Plan Note (Addendum)
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 Aspiration and injection. X-rays. Return in a month.  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  Adding allopurinol 300 mg daily, recheck uric acid levels in 1 month.  We can also use colchicine for flares.   Persistent pain in spite of injections, allopurinol 300 daily, adding colchicine 0.6 mg daily.  Recheck in a month.

## 2018-03-28 NOTE — Progress Notes (Addendum)
Subjective:    CC: Severe right foot pain  HPI: [name] is a pleasant 72 year old female, over 6 days she developed severe worsening right foot and ankle pain and swelling.  It was hot, red, swollen.  No changes in diet.  No changes in medication with the exception of a decrease in amlodipine and increase in furosemide.  She is at the point where she cannot really bear weight.  No trauma.  I reviewed the past medical history, family history, social history, surgical history, and allergies today and no changes were needed.  Please see the problem list section below in epic for further details.  Past Medical History: Past Medical History:  Diagnosis Date  . Arthritis    "qwhere" (06/06/2017)  . Chronic bilateral low back pain   . CKD (chronic kidney disease) stage 3, GFR 30-59 ml/min (HCC) 09/21/2012  . Complication of anesthesia    slow to wake up "once" (06/06/2017)  . DDD (degenerative disc disease), cervical   . Dysrhythmia   . Esophageal stricture   . GERD 10/04/2007   Qualifier: Diagnosis of  By: Linford ArnoldMetheney MD, Santina Evansatherine    . HIATAL HERNIA WITH REFLUX 10/04/2007   Qualifier: Diagnosis of  By: Linford ArnoldMetheney MD, Santina Evansatherine    . History of hiatal hernia   . History of kidney stones   . Hyperlipidemia 08/10/2010   Qualifier: Diagnosis of  By: Linford ArnoldMetheney MD, Santina Evansatherine    . Hypertension   . HYPERTENSION, BENIGN ESSENTIAL 04/27/2007   Qualifier: Diagnosis of  By: Linford ArnoldMetheney MD, Santina Evansatherine    . Impaired fasting glucose 08/10/2010   Qualifier: Diagnosis of  By: Linford ArnoldMetheney MD, Santina Evansatherine    . Lobar pneumonia (HCC)    "never missed a day of work w/it"  . Other specified cardiac arrhythmias 01/02/2015  . 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ann [facility]   . Seborrheic dermatitis 06/12/2014   Past Surgical History: Past Surgical History:  Procedure Laterality Date  . ABDOMINAL HYSTERECTOMY    . BACK  SURGERY    . BLADDER SUSPENSION    . CARPAL TUNNEL RELEASE Bilateral   . CYSTOSCOPY W/ STONE MANIPULATION    . DILATION AND CURETTAGE OF UTERUS    . ESOPHAGOGASTRODUODENOSCOPY (EGD) WITH ESOPHAGEAL DILATION  X 1  . GUM SURGERY     ramus frame implant lower gum  . INSERT / REPLACE / REMOVE PACEMAKER  08/2012  . JOINT REPLACEMENT    . LUMBAR DISC SURGERY  X 2  . TARSAL TUNNEL RELEASE Bilateral   . TONSILLECTOMY    . TOTAL HIP ARTHROPLASTY Left 06/06/2017  . TOTAL HIP ARTHROPLASTY Left 06/06/2017   Procedure: TOTAL HIP ARTHROPLASTY ANTERIOR APPROACH;  Surgeon: Gean Birchwoodowan, Frank, MD;  Location: [facility] OR;  Service: Orthopedics;  Laterality: Left;  . TOTAL KNEE ARTHROPLASTY Left 2006  . TOTAL KNEE ARTHROPLASTY Right 11/30/2017   Procedure: TOTAL KNEE ARTHROPLASTY;  Surgeon: Gean Birchwoodowan, Frank, MD;  [facility];  Service: Orthopedics;  Laterality: Right;  . TOTAL KNEE REVISION Left 2007   new hardware  due to staph infection, on chronic keflex  . TRANSTHORACIC ECHOCARDIOGRAM     08/14/12 [facility]): NL LVF, no segmental abnormality, borderline LVH, mild MR, mild MR, trace TR   Social History: Social History   Socioeconomic History  . Marital status: Widowed    Spouse name: Not on file  . Number of children: Not on file  .  Years of education: Not on file  . Highest education level: Not on file  Occupational History  . Not on file  Social Needs  . Financial resource strain: Not on file  . Food insecurity:    Worry: Not on file    Inability: Not on file  . Transportation needs:    Medical: Not on file    Non-medical: Not on file  Tobacco Use  . Smoking status: Never Smoker  . Smokeless tobacco: Never Used  Substance and Sexual Activity  . Alcohol use: No  . Drug use: No  . Sexual activity: Not on file  Lifestyle  . Physical activity:    Days per week: Not on file    Minutes per session: Not on file  . Stress: Not on file  Relationships  . Social connections:    Talks on  phone: Not on file    Gets together: Not on file    Attends religious service: Not on file    Active member of club or organization: Not on file    Attends meetings of clubs or organizations: Not on file    Relationship status: Not on file  Other Topics Concern  . Not on file  Social History Narrative  . Not on file   Family History: Family History  Problem Relation Age of Onset  . Heart attack Mother   . Diabetes Mother 79  . Hypertension Mother   . Hypertension Father    Allergies: No Known Allergies Medications: See med rec.  Review of Systems: No fevers, chills, night sweats, weight loss, chest pain, or shortness of breath.   Objective:    General: Well Developed, well nourished, and in no acute distress.  Neuro: Alert and oriented x3, extra-ocular muscles intact, sensation grossly intact.  HEENT: Normocephalic, atraumatic, pupils equal round reactive to light, neck supple, no masses, no lymphadenopathy, thyroid nonpalpable.  Skin: Warm and dry, no rashes. Cardiac: Regular rate and rhythm, no murmurs rubs or gallops, no lower extremity edema.  Respiratory: Clear to auscultation bilaterally. Not using accessory muscles, speaking in full sentences. Right ankle: Swollen with a palpable fluid wave through the tibiotalar joint, swelling extends to the dorsal midfoot with some redness, swelling at the first metatarsophalangeal joint and tenderness over the dorsal midfoot over the fourth metatarsal shaft.  Procedure: Real-time Ultrasound Guided aspiration/injection of right ankle joint Device: GE Logiq E  Verbal informed consent obtained.  Time-out conducted.  Noted no overlying erythema, induration, or other signs of local infection.  Skin prepped in a sterile fashion.  Local anesthesia: Topical Ethyl chloride.  With sterile technique and under real time ultrasound guidance: On ultrasound I noted several areas of effusion, tibiotalar joint as well as several areas in the  subtalar joint.  I advanced an 18-gauge needle and a short axis into the tibiotalar joint and aspirated 3 mL of cloudy, straw-colored fluid, syringe switched and 1 cc kenalog 40, 1 cc lidocaine, 1 cc bupivacaine injected easily.  There did appear to be a step-off over the fourth metatarsal shaft. Completed without difficulty  Pain immediately resolved suggesting accurate placement of the medication.  Advised to call if fevers/chills, erythema, induration, drainage, or persistent bleeding.  Images permanently stored and available for review in the ultrasound unit.  Impression: Technically successful ultrasound guided injection.  The foot and ankle were then strapped with a compressive dressing.  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 Aspiration and injection.  X-rays. Return in a month.  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  Adding allopurinol 300 mg daily, recheck uric acid levels in 1 month.  We can also use colchicine for flares.   Persistent pain in spite of injections, allopurinol 300 daily, adding colchicine 0.6 mg daily.  Recheck in a month. ___________________________________________ Ihor Austin. Benjamin Stain, M.D., ABFM., CAQSM. Primary Care and Sports Medicine [facility]  Adjunct Instructor of Family Medicine  [employer]

## 2018-03-28 NOTE — Progress Notes (Signed)
HPI:                                                                [name] is a 72 y.o. female who presents to [facility]: Primary Care Sports Medicine today for peripheral edema  History is provided by the patient and her daughter.  Pleasant 50 yo F with complex PMH HTN, arrythmia with pacemaker, hyperuricemia, CKD stage 3, hx of PUD/GI bleed, multiple surgeries including exploratory laparotomy and right TKR this year presents with new onset peripheral edema for approximately 6 days. Swelling is symmetric and associated with severe right forefoot pain. Swelling was interfering with ambulation. She typically ambulates with a walker at home, but pain became so unbearable she had to use a wheelchair. Her daughter contacted our office over the weekend and I was actually the provider on-call.  Her recent labs showed elevated uric acid (7.8) mild leukocytosis (11.2), mild thrombocytosis (417) and stable CKD with GFR 39. She was started on Lasix 20 mg bid x 3 days. Swelling has improved, but she continues to complain of severe pain in her right foot. Denies fever, chills, malaise, dyspnea, or chest pain.  She also noted redness in her right first MTP and right 3rd phalanx. Patient describes pain as "feeling like driving a nail through the top of my foot." Pain was unbearable last night and she was unable to sleep.    Daughter is also very concerned about magnesium deficiency and would like this checked  Depression screen [facility] 2/9 12/13/2016 09/30/2015 06/12/2014  Decreased Interest 0 0 0  Down, Depressed, Hopeless 0 0 0  PHQ - 2 Score 0 0 0  Altered sleeping - - 3  Tired, decreased energy - - 1  Change in appetite - - 0  Feeling bad or failure about yourself  - - 0  Trouble concentrating - - 0  Moving slowly or fidgety/restless - - 0  Suicidal thoughts - - 0  PHQ-9 Score - - 4    No flowsheet data found.    Past Medical History:  Diagnosis Date  . Arthritis     "qwhere" (06/06/2017)  . Chronic bilateral low back pain   . CKD (chronic kidney disease) stage 3, GFR 30-59 ml/min (HCC) 09/21/2012  . Complication of anesthesia    slow to wake up "once" (06/06/2017)  . DDD (degenerative disc disease), cervical   . Dysrhythmia   . Esophageal stricture   . GERD 10/04/2007   Qualifier: Diagnosis of  By: Linford Arnold MD, Santina Evans    . HIATAL HERNIA WITH REFLUX 10/04/2007   Qualifier: Diagnosis of  By: Linford Arnold MD, Santina Evans    . History of hiatal hernia   . History of kidney stones   . Hyperlipidemia 08/10/2010   Qualifier: Diagnosis of  By: Linford Arnold MD, Santina Evans    . Hypertension   . HYPERTENSION, BENIGN ESSENTIAL 04/27/2007   Qualifier: Diagnosis of  By: Linford Arnold MD, Santina Evans    . Impaired fasting glucose 08/10/2010   Qualifier: Diagnosis of  By: Linford Arnold MD, Santina Evans    . Lobar pneumonia (HCC)    "never missed a day of work w/it"  . Other specified cardiac arrhythmias 01/02/2015  . Presence of permanent cardiac pacemaker    Medtronic dual chamber  pacemaker 08/14/12 (for complete heart block with syncope) Dr[facility]   . Seborrheic dermatitis 06/12/2014   Past Surgical History:  Procedure Laterality Date  . ABDOMINAL HYSTERECTOMY    . BACK SURGERY    . BLADDER SUSPENSION    . CARPAL TUNNEL RELEASE Bilateral   . CYSTOSCOPY W/ STONE MANIPULATION    . DILATION AND CURETTAGE OF UTERUS    . ESOPHAGOGASTRODUODENOSCOPY (EGD) WITH ESOPHAGEAL DILATION  X 1  . GUM SURGERY     ramus frame implant lower gum  . INSERT / REPLACE / REMOVE PACEMAKER  08/2012  . JOINT REPLACEMENT    . LUMBAR DISC SURGERY  X 2  . TARSAL TUNNEL RELEASE Bilateral   . TONSILLECTOMY    . TOTAL HIP ARTHROPLASTY Left 06/06/2017  . TOTAL HIP ARTHROPLASTY Left 06/06/2017   Procedure: TOTAL HIP ARTHROPLASTY ANTERIOR APPROACH;  Surgeon: Gean Birchwoodowan, Frank, MD;  Location: [facility] OR;  Service: Orthopedics;  Laterality: Left;  . TOTAL KNEE ARTHROPLASTY Left 2006  . TOTAL KNEE  ARTHROPLASTY Right 11/30/2017   Procedure: TOTAL KNEE ARTHROPLASTY;  Surgeon: Gean Birchwoodowan, Frank, MD;  [facility];  Service: Orthopedics;  Laterality: Right;  . TOTAL KNEE REVISION Left 2007   new hardware  due to staph infection, on chronic keflex  . TRANSTHORACIC ECHOCARDIOGRAM     08/14/12 [facility]): NL LVF, no segmental abnormality, borderline LVH, mild MR, mild MR, trace TR   Social History   Tobacco Use  . Smoking status: Never Smoker  . Smokeless tobacco: Never Used  Substance Use Topics  . Alcohol use: No   family history includes Diabetes (age of onset: 6045) in her mother; Heart attack in her mother; Hypertension in her father and mother.    ROS: negative except as noted in the HPI  Medications: Current Outpatient Medications  Medication Sig Dispense Refill  . amLODipine (NORVASC) 10 MG tablet Take 10 mg by mouth daily.    Marland Kitchen. desonide (DESOWEN) 0.05 % cream APPLY TOPICALLY DAILY AS NEEDED. DO NOT USE FOR MORE THAN 2 WEEKS IN A ROW ON THE FACE 30 g 0  . furosemide (LASIX) 20 MG tablet Take 1 tablet (20 mg total) by mouth 2 (two) times daily. 6 tablet 0  . lisinopril (PRINIVIL,ZESTRIL) 2.5 MG tablet Take 1 tablet (2.5 mg total) by mouth daily. Take 1 or 2 tablets daily 90 tablet 0  . pantoprazole (PROTONIX) 40 MG tablet Take by mouth.    Marland Kitchen. tiZANidine (ZANAFLEX) 2 MG tablet Take 1 tablet (2 mg total) by mouth every 6 (six) hours as needed. 60 tablet 0  . traZODone (DESYREL) 50 MG tablet Take 0.5-2 tablets (25-100 mg total) by mouth at bedtime as needed for sleep. 30 tablet 1   No current facility-administered medications for this visit.    No Known Allergies     Objective:  BP 119/69   Pulse 94   Temp 98.2 F (36.8 C) (Oral)   Wt 173 lb (78.5 kg)   SpO2 98%   BMI 28.79 kg/m  Gen:  alert, not ill-appearing, no distress, appropriate for age, seated in a wheelchair HEENT: head normocephalic without obvious abnormality, conjunctiva and cornea clear, trachea  midline, no JVD Pulm: Normal work of breathing, normal phonation, clear to auscultation bilaterally, no wheezes, rales or rhonchi CV: Normal rate, regular rhythm, s1 and s2 distinct, no murmurs, clicks or rubs  Neuro: alert and oriented x 3, no tremor MSK: dorsum of right forefoot is bruised over  the 3rd-4th metatarsals, 1+ pitting edema of the foot, ankle and mid-tibia bilaterally; there is erythema and tenderness overlying the first MTP Skin: intact, no rashes on exposed skin, no jaundice, no cyanosis Psych: well-groomed, cooperative, good eye contact, euthymic mood, affect mood-congruent, speech is articulate, and thought processes clear and goal-directed  Lab Results  Component Value Date   LABURIC 7.8 (H) 03/23/2018   Lab Results  Component Value Date   WBC 11.2 (H) 03/23/2018   HGB 12.0 03/23/2018   HCT 36.7 03/23/2018   MCV 85.2 03/23/2018   PLT 417 (H) 03/23/2018   Lab Results  Component Value Date   CREATININE 1.36 (H) 03/23/2018   BUN 23 03/23/2018   NA 137 03/23/2018   K 4.6 03/23/2018   CL 101 03/23/2018   CO2 27 03/23/2018     No results found for this or any previous visit (from the past 72 hour(s)). No results found.    Assessment and Plan: 72 y.o. female with   .Diagnoses and all orders for this visit:  Peripheral edema -     hydrochlorothiazide (HYDRODIURIL) 12.5 MG tablet; Take 1 tablet (12.5 mg total) by mouth daily.  CKD (chronic kidney disease) stage 3, GFR 30-59 ml/min (HCC) -     Magnesium; Future -     BASIC METABOLIC PANEL WITH GFR -     Magnesium  Essential hypertension -     amLODipine (NORVASC) 5 MG tablet; Take 1 tablet (5 mg total) by mouth daily. -     hydrochlorothiazide (HYDRODIURIL) 12.5 MG tablet; Take 1 tablet (12.5 mg total) by mouth daily.  Right ankle swelling -     Synovial fluid, crystal -     Cell Count and Diff, Fluid, Other -     DG Foot Complete Right; Future -     DG Ankle Complete Right;  Future  Hyperuricemia    Peripheral edema - afebrile, no tachycardia, no tachypnea, SpO2 98% on RA at rest, no adventitious lung sounds, no JVD - weight today 173 lb, down 5 pounds compared to 2 weeks ago with diuresis with Lasix - I suspect the edema is complicated by an arthropathy given her elevated uric acid and pain/swelling/redness of the first MTP. I consulted Sports Medicine today to address this - decreasing Amlodipine to 5 mg and adding Hydrochlorothiazide 12.5 mg - cont Lisinopril 2.5 mg. Discussed with PCP and we will keep ACE low-dose due to her decreased GFR  Patient education and anticipatory guidance given Patient agrees with treatment plan Follow-up in 1 week with PCP for HTN/edema or sooner as needed if symptoms worsen or fail to improve  I spent 25 minutes with this patient, greater than 50% was face-to-face time counseling regarding the above diagnoses  Levonne Hubert PA-C

## 2018-03-28 NOTE — Patient Instructions (Addendum)
For your blood pressure: - Goal <140/90 - reduce Amlodipine to 5 mg daily - start Hydrochlorothiazide 12.5 mg daily - monitor and log blood pressures at home - check around the same time each day in a relaxed setting - Limit salt to <2000 mg/day - Follow DASH eating plan - limit alcohol to 2 standard drinks per day for men and 1 per day for women - avoid tobacco products   Chronic Kidney Disease, Adult Chronic kidney disease (CKD) occurs when the kidneys become damaged slowly over a long period of time. The kidneys are a pair of organs that do many important jobs in the body, including:  Removing waste and extra fluid from the blood to make urine.  Making hormones that maintain the amount of fluid in tissues and blood vessels.  Maintaining the right amount of fluids and chemicals in the body.  A small amount of kidney damage may not cause problems, but a large amount of damage may make it hard or impossible for the kidneys to work the way they should. If steps are not taken to slow down kidney damage or to stop it from getting worse, the kidneys may stop working permanently (end-stage renal disease or ESRD). Most of the time, CKD does not go away, but it can often be controlled. People who have CKD are usually able to live normal lives. What are the causes? The most common causes of this condition are diabetes and high blood pressure (hypertension). Other causes include:  Heart and blood vessel (cardiovascular) disease.  Kidney diseases, such as: ? Glomerulonephritis. ? Interstitial nephritis. ? Polycystic kidney disease. ? Renal vascular disease.  Diseases that affect the immune system.  Genetic diseases.  Medicines that damage the kidneys, such as anti-inflammatory medicines.  Being around or being in contact with poisonous (toxic) substances.  A kidney or urinary infection that occurs again and again (recurs).  Vasculitis. This is swelling or inflammation of the blood  vessels.  A problem with urine flow that may be caused by: ? Cancer. ? Having kidney stones more than one time. ? An enlarged prostate, in males.  What increases the risk? You are more likely to develop this condition if you:  Are older than age 86.  Are female.  Are African-American, Hispanic, Asian, [place] Islander, or American [place].  Are a current or former smoker.  Are obese.  Have a family history of kidney disease or failure.  Often take medicines that are damaging to the kidneys.  What are the signs or symptoms? Symptoms of this condition include:  Swelling (edema) of the face, legs, ankles, or feet.  Tiredness (lethargy) and having less energy.  Nausea or vomiting.  Confusion or trouble concentrating.  Problems with urination, such as: ? Painful or burning feeling during urination. ? Decreased urine production. ? Frequent urination, especially at night. ? Bloody urine.  Muscle twitches and cramps, especially in the legs.  Shortness of breath.  Weakness.  Loss of appetite.  Metallic taste in the mouth.  Trouble sleeping.  Dry, itchy skin.  A low blood count (anemia).  Pale lining of the eyelids and surface of the eye (conjunctiva).  Symptoms develop slowly and may not be obvious until the kidney damage becomes severe. It is possible to have kidney disease for years without having any symptoms. How is this diagnosed? This condition may be diagnosed based on:  Blood tests.  Urine tests.  Imaging tests, such as an ultrasound or CT scan.  A test in  which a sample of tissue is removed from the kidneys to be examined under a microscope (kidney biopsy).  These test results will help your health care provider determine how serious the CKD is. How is this treated? There is no cure for most cases of this condition, but treatment usually relieves symptoms and prevents or slows the progression of the disease. Treatment may include:  Making diet  changes, which may require you to avoid alcohol, salty foods (sodium), and foods that are high in potassium, calcium, and protein.  Medicines: ? To lower blood pressure. ? To control blood glucose. ? To relieve anemia. ? To relieve swelling. ? To protect your bones. ? To improve the balance of electrolytes in your blood.  Removing toxic waste from the body through types of dialysis, if the kidneys can no longer do their job (kidney failure).  Managing any other conditions that are causing your CKD or making it worse.  Follow these instructions at home: Medicines  Take over-the-counter and prescription medicines only as told by your health care provider. The dose of some medicines that you take may need to be adjusted.  Do not take any new medicines unless approved by your health care provider. Many medicines can worsen your kidney damage.  Do not take any vitamin and mineral supplements unless approved by your health care provider. Many nutritional supplements can worsen your kidney damage. General instructions  Follow your prescribed diet as told by your health care provider.  Do not use any products that contain nicotine or tobacco, such as cigarettes and e-cigarettes. If you need help quitting, ask your health care provider.  Monitor and track your blood pressure at home. Report changes in your blood pressure as told by your health care provider.  If you are being treated for diabetes, monitor and track your blood sugar (blood glucose) levels as told by your health care provider.  Maintain a healthy weight. If you need help with this, ask your health care provider.  Start or continue an exercise plan. Exercise at least 30 minutes a day, 5 days a week.  Keep your immunizations up to date as told by your health care provider.  Keep all follow-up visits as told by your health care provider. This is important. Where to find more information:  American Association of Kidney  Patients: ResidentialShow.is  [employer]: www.kidney.org  American Kidney Fund: FightingMatch.com.ee  [facility]: www.lifeoptions.org and www.kidneyschool.org Contact a health care provider if:  Your symptoms get worse.  You develop new symptoms. Get help right away if:  You develop symptoms of ESRD, which include: ? Headaches. ? Numbness in the hands or feet. ? Easy bruising. ? Frequent hiccups. ? Chest pain. ? Shortness of breath. ? Lack of menstruation, in women.  You have a fever.  You have decreased urine production.  You have pain or bleeding when you urinate. Summary  Chronic kidney disease (CKD) occurs when the kidneys become damaged slowly over a long period of time.  The most common causes of this condition are diabetes and high blood pressure (hypertension).  There is no cure for most cases of this condition, but treatment usually relieves symptoms and prevents or slows the progression of the disease. Treatment may include a combination of medicines and lifestyle changes. This information is not intended to replace advice given to you by your health care provider. Make sure you discuss any questions you have with your health care provider. Document Released: 04/27/2008 Document Revised:  08/26/2016 Document Reviewed: 08/26/2016 Elsevier Interactive Patient Education  [employer]2018 Elsevier Inc.

## 2018-03-29 LAB — SYNOVIAL FLUID, CRYSTAL

## 2018-03-29 LAB — CELL COUNT + DIFF, W/O CRYST-SYNVL FLD
Basophils, %: 0 %
Eosinophils-Synovial: 0 % (ref 0–2)
Lymphocytes-Synovial Fld: 0 % (ref 0–74)
Monocyte/Macrophage: 8 % (ref 0–69)
Neutrophil, Synovial: 92 % — ABNORMAL HIGH (ref 0–24)
Synoviocytes, %: 0 % (ref 0–15)
WBC, Synovial: 1967 cells/uL — ABNORMAL HIGH (ref <150)

## 2018-03-29 MED ORDER — ALLOPURINOL 300 MG PO TABS: 300.0000 mg | ORAL_TABLET | Freq: Every day | ORAL | 6 refills | Status: DC

## 2018-03-29 NOTE — Addendum Note (Signed)
Addended by: Monica BectonHEKKEKANDAM, THOMAS J on: 03/29/2018 08:21 AM   Modules accepted: Orders

## 2018-04-04 LAB — BASIC METABOLIC PANEL WITH GFR
BUN/Creatinine Ratio: 23 (calc) — ABNORMAL HIGH (ref 6–22)
BUN: 31 mg/dL — AB (ref 7–25)
CALCIUM: 9.2 mg/dL (ref 8.6–10.4)
CO2: 25 mmol/L (ref 20–32)
CREATININE: 1.37 mg/dL — AB (ref 0.60–0.93)
Chloride: 105 mmol/L (ref 98–110)
GFR, EST AFRICAN AMERICAN: 44 mL/min/{1.73_m2} — AB (ref >=60)
GFR, EST NON AFRICAN AMERICAN: 38 mL/min/{1.73_m2} — AB (ref >=60)
Glucose, Bld: 92 mg/dL (ref 65–99)
Potassium: 4.7 mmol/L (ref 3.5–5.3)
Sodium: 138 mmol/L (ref 135–146)

## 2018-04-05 ENCOUNTER — Telehealth: Payer: Self-pay | Admitting: Family Medicine

## 2018-04-05 LAB — COMPREHENSIVE METABOLIC PANEL
ALT: 12 U/L (ref 6–29)
Albumin: 3.8 g/dL (ref 3.6–5.1)
BUN/Creatinine Ratio: 22 (calc) (ref 6–22)
BUN: 30 mg/dL — ABNORMAL HIGH (ref 7–25)
Calcium: 9.2 mg/dL (ref 8.6–10.4)
Creat: 1.37 mg/dL — ABNORMAL HIGH (ref 0.60–0.93)
Glucose, Bld: 94 mg/dL (ref 65–99)
Sodium: 138 mmol/L (ref 135–146)
Total Protein: 6.3 g/dL (ref 6.1–8.1)

## 2018-04-05 LAB — COMPREHENSIVE METABOLIC PANEL WITH GFR
AG Ratio: 1.5 (calc) (ref 1.0–2.5)
AST: 11 U/L (ref 10–35)
Alkaline phosphatase (APISO): 34 U/L (ref 33–130)
CO2: 25 mmol/L (ref 20–32)
Chloride: 105 mmol/L (ref 98–110)
Globulin: 2.5 g/dL (ref 1.9–3.7)
Potassium: 4.8 mmol/L (ref 3.5–5.3)
Total Bilirubin: 0.2 mg/dL (ref 0.2–1.2)

## 2018-04-05 LAB — MAGNESIUM: MAGNESIUM: 1.9 mg/dL (ref 1.5–2.5)

## 2018-04-05 LAB — URIC ACID: Uric Acid, Serum: 4.3 mg/dL (ref 2.5–7.0)

## 2018-04-05 MED ORDER — COLCHICINE 0.6 MG PO CAPS: 1.0000 | ORAL_CAPSULE | Freq: Every day | ORAL | 3 refills | Status: DC

## 2018-04-05 MED ORDER — COLCHICINE 0.6 MG PO TABS: 0.6000 mg | ORAL_TABLET | Freq: Every day | ORAL | 2 refills | Status: DC

## 2018-04-05 NOTE — Addendum Note (Signed)
Addended by: Monica Becton on: 04/05/2018 12:44 PM   Modules accepted: Orders

## 2018-04-05 NOTE — Telephone Encounter (Signed)
Lets try switching to mitigare.

## 2018-04-05 NOTE — Telephone Encounter (Signed)
Pt called. She states the Colchicine is too expensive and can you call her in something else that's  less expensive?

## 2018-04-06 ENCOUNTER — Ambulatory Visit: Payer: Medicare Other | Admitting: Family Medicine

## 2018-04-06 NOTE — Telephone Encounter (Signed)
Patient says her daughter had a prescription card she could use and she was able to get the colchicine for $10.

## 2018-04-24 ENCOUNTER — Other Ambulatory Visit: Payer: Self-pay

## 2018-04-24 MED ORDER — MECLIZINE HCL 25 MG PO TABS: 25.0000 mg | ORAL_TABLET | Freq: Three times a day (TID) | ORAL | 2 refills | Status: DC | PRN

## 2018-04-24 NOTE — Telephone Encounter (Signed)
Routing to PCP for review.

## 2018-04-24 NOTE — Telephone Encounter (Signed)
rx sent

## 2018-04-24 NOTE — Telephone Encounter (Signed)
PT needs a refill on Antivert she's having vertigo issues. She hasn't had this medication since 2016. Pharmacy on file.

## 2018-04-24 NOTE — Telephone Encounter (Signed)
Medication held for pcp to review and sign if ok for refill.[name], Culver Feighner [name], CMA

## 2018-04-25 ENCOUNTER — Ambulatory Visit (INDEPENDENT_AMBULATORY_CARE_PROVIDER_SITE_OTHER): Payer: Medicare Other | Admitting: Sports Medicine

## 2018-04-25 DIAGNOSIS — M6580 Other synovitis and tenosynovitis, unspecified site: Secondary | ICD-10-CM | POA: Diagnosis not present

## 2018-04-25 DIAGNOSIS — M119 Crystal arthropathy, unspecified: Secondary | ICD-10-CM

## 2018-04-25 NOTE — Patient Instructions (Signed)
Calcium Pyrophosphate Deposition (Pseudogout) Calcium pyrophosphate deposition (CPPD), which is also called pseudogout, is a type of arthritis that causes pain, swelling, and inflammation in a joint. The joint pain can be severe and may last for days. If it is not treated, the pain may last much longer. Attacks of CPPD may come and go. This condition usually affects one joint at a time. The joints that are affected most commonly are the knees, but this condition can also affect the wrists, elbows, shoulders, or ankles. CPPD is similar to gout. Both conditions result from the buildup of crystals in the joint. However, CPPD is caused by a type of crystal that is different than the crystals that cause gout. What are the causes? This condition is caused by the buildup of calcium pyrophosphate dihydrate crystals in the joint. The reason why this buildup occurs is not known. The condition may be passed down from parent to child (hereditary). What increases the risk? This condition is more likely to develop in people who:  Are over 10 years old.  Have a family history of the condition.  Have had joint replacement surgery.  Have had a recent injury.  Have certain medical conditions, such as hemophilia, ochronosis, amyloidosis, or hormonal disorders.  Have low blood magnesium levels.  What are the signs or symptoms? Symptoms of this condition include:  Pain in a joint. The pain may: ? Be intense and constant. ? Come on quickly. ? Get worse with movement. ? Last from several days to a few weeks.  Redness, swelling, and warmth at the joint.  Stiffness of the joint.  How is this diagnosed? To diagnose this condition, your health care provider will use a needle to remove fluid from the joint. The fluid will be examined under a microscope to check for the crystals that cause CPPD. You may also have imaging tests, such as:  X-rays.  Ultrasound.  How is this treated? There is no way to  remove the crystals from the joint and no way to cure this condition. However, treatment can relieve symptoms and improve joint function. Treatment may include:  Nonsteroidal anti-inflammatory drugs (NSAIDs) to reduce inflammation and pain.  Medicines to help prevent attacks.  Injections of medicine (cortisone) into the joint to reduce pain and swelling.  Physical therapy to improve joint function.  Follow these instructions at home:  Take medicines only as directed by your health care provider.  Rest the affected joints until your symptoms start to go away.  Keep your affected joints raised (elevated) when possible. This will help to reduce swelling.  If directed, apply ice to the affected area: ? Put ice in a plastic bag. ? Place a towel between your skin and the bag. ? Leave the ice on for 20 minutes, 2-3 times per day.  If the painful joint is in your leg, use crutches as directed by your health care provider.  When your symptoms start to go away, begin to exercise regularly or do physical therapy. Talk with your health care provider or physical therapist about what types of exercise are safe for you. Low-impact exercise may be best. This includes walking, swimming, bicycling, and water aerobics.  Maintain a healthy weight so your joints do not need to bear more weight than necessary. Contact a health care provider if:  You have an increase in joint pain that is not relieved with medicine.  Your joint becomes more red, swollen, or stiff.  You have a fever.  You have a  skin rash. This information is not intended to replace advice given to you by your health care provider. Make sure you discuss any questions you have with your health care provider. Document Released: 04/10/2004 Document Revised: 12/25/2015 Document Reviewed: 06/26/2014 Elsevier Interactive Patient Education  2018 [employer] Gout Gout is painful swelling that can occur in some of your joints. Gout is a  type of arthritis. This condition is caused by having too much uric acid in your body. Uric acid is a chemical that forms when your body breaks down substances called purines. Purines are important for building body proteins. When your body has too much uric acid, sharp crystals can form and build up inside your joints. This causes pain and swelling. Gout attacks can happen quickly and be very painful (acute gout). Over time, the attacks can affect more joints and become more frequent (chronic gout). Gout can also cause uric acid to build up under your skin and inside your kidneys. What are the causes? This condition is caused by too much uric acid in your blood. This can occur because:  Your kidneys do not remove enough uric acid from your blood. This is the most common cause.  Your body makes too much uric acid. This can occur with some cancers and cancer treatments. It can also occur if your body is breaking down too many red blood cells (hemolytic anemia).  You eat too many foods that are high in purines. These foods include organ meats and some seafood. Alcohol, especially beer, is also high in purines.  A gout attack may be triggered by trauma or stress. What increases the risk? This condition is more likely to develop in people who:  Have a family history of gout.  Are female and middle-aged.  Are female and have gone through menopause.  Are obese.  Frequently drink alcohol, especially beer.  Are dehydrated.  Lose weight too quickly.  Have an organ transplant.  Have lead poisoning.  Take certain medicines, including aspirin, cyclosporine, diuretics, levodopa, and niacin.  Have kidney disease or psoriasis.  What are the signs or symptoms? An attack of acute gout happens quickly. It usually occurs in just one joint. The most common place is the big toe. Attacks often start at night. Other joints that may be affected include joints of the feet, ankle, knee, fingers, wrist, or  elbow. Symptoms may include:  Severe pain.  Warmth.  Swelling.  Stiffness.  Tenderness. The affected joint may be very painful to touch.  Shiny, red, or purple skin.  Chills and fever.  Chronic gout may cause symptoms more frequently. More joints may be involved. You may also have white or yellow lumps (tophi) on your hands or feet or in other areas near your joints. How is this diagnosed? This condition is diagnosed based on your symptoms, medical history, and physical exam. You may have tests, such as:  Blood tests to measure uric acid levels.  Removal of joint fluid with a needle (aspiration) to look for uric acid crystals.  X-rays to look for joint damage.  How is this treated? Treatment for this condition has two phases: treating an acute attack and preventing future attacks. Acute gout treatment may include medicines to reduce pain and swelling, including:  NSAIDs.  Steroids. These are strong anti-inflammatory medicines that can be taken by mouth (orally) or injected into a joint.  Colchicine. This medicine relieves pain and swelling when it is taken soon after an attack. It can be given orally  or through an IV tube.  Preventive treatment may include:  Daily use of smaller doses of NSAIDs or colchicine.  Use of a medicine that reduces uric acid levels in your blood.  Changes to your diet. You may need to see a specialist about healthy eating (dietitian).  Follow these instructions at home: During a Gout Attack  If directed, apply ice to the affected area: ? Put ice in a plastic bag. ? Place a towel between your skin and the bag. ? Leave the ice on for 20 minutes, 2-3 times a day.  Rest the joint as much as possible. If the affected joint is in your leg, you may be given crutches to use.  Raise (elevate) the affected joint above the level of your heart as often as possible.  Drink enough fluids to keep your urine clear or pale yellow.  Take  over-the-counter and prescription medicines only as told by your health care provider.  Do not drive or operate heavy machinery while taking prescription pain medicine.  Follow instructions from your health care provider about eating or drinking restrictions.  Return to your normal activities as told by your health care provider. Ask your health care provider what activities are safe for you. Avoiding Future Gout Attacks  Follow a low-purine diet as told by your dietitian or health care provider. Avoid foods and drinks that are high in purines, including liver, kidney, anchovies, asparagus, herring, mushrooms, mussels, and beer.  Limit alcohol intake to no more than 1 drink a day for nonpregnant women and 2 drinks a day for men. One drink equals 12 oz of beer, 5 oz of wine, or 1 oz of hard liquor.  Maintain a healthy weight or lose weight if you are overweight. If you want to lose weight, talk with your health care provider. It is important that you do not lose weight too quickly.  Start or maintain an exercise program as told by your health care provider.  Drink enough fluids to keep your urine clear or pale yellow.  Take over-the-counter and prescription medicines only as told by your health care provider.  Keep all follow-up visits as told by your health care provider. This is important. Contact a health care provider if:  You have another gout attack.  You continue to have symptoms of a gout attack after10 days of treatment.  You have side effects from your medicines.  You have chills or a fever.  You have burning pain when you urinate.  You have pain in your lower back or belly. Get help right away if:  You have severe or uncontrolled pain.  You cannot urinate. This information is not intended to replace advice given to you by your health care provider. Make sure you discuss any questions you have with your health care provider. Document Released: 07/16/2000 Document  Revised: 12/25/2015 Document Reviewed: 05/01/2015 Elsevier Interactive Patient Education  2018 [employer].  Low-Purine Diet Purines are compounds that affect the level of uric acid in your body. A low-purine diet is a diet that is low in purines. Eating a low-purine diet can prevent the level of uric acid in your body from getting too high and causing gout or kidney stones or both. What do I need to know about this diet?  Choose low-purine foods. Examples of low-purine foods are listed in the next section.  Drink plenty of fluids, especially water. Fluids can help remove uric acid from your body. Try to drink 8-16 cups (1.9-3.8 L)  a day.  Limit foods high in fat, especially saturated fat, as fat makes it harder for the body to get rid of uric acid. Foods high in saturated fat include pizza, cheese, ice cream, whole milk, fried foods, and gravies. Choose foods that are lower in fat and lean sources of protein. Use olive oil when cooking as it contains healthy fats that are not high in saturated fat.  Limit alcohol. Alcohol interferes with the elimination of uric acid from your body. If you are having a gout attack, avoid all alcohol.  Keep in mind that different people's bodies react differently to different foods. You will probably learn over time which foods do or do not affect you. If you discover that a food tends to cause your gout to flare up, avoid eating that food. You can more freely enjoy foods that do not cause problems. If you have any questions about a food item, talk to your dietitian or health care provider. Which foods are low, moderate, and high in purines? The following is a list of foods that are low, moderate, and high in purines. You can eat any amount of the foods that are low in purines. You may be able to have small amounts of foods that are moderate in purines. Ask your health care provider how much of a food moderate in purines you can have. Avoid foods high in  purines. Grains  Foods low in purines: Enriched white bread, pasta, rice, cake, cornbread, popcorn.  Foods moderate in purines: Whole-grain breads and cereals, wheat germ, bran, oatmeal. Uncooked oatmeal. Dry wheat bran or wheat germ.  Foods high in purines: Pancakes, [place] toast, biscuits, muffins. Vegetables  Foods low in purines: All vegetables, except those that are moderate in purines.  Foods moderate in purines: Asparagus, cauliflower, spinach, mushrooms, green peas. Fruits  All fruits are low in purines. Meats and other Protein Foods  Foods low in purines: Eggs, nuts, peanut butter.  Foods moderate in purines: 80-90% lean beef, lamb, veal, pork, poultry, fish, eggs, peanut butter, nuts. Crab, lobster, oysters, and shrimp. Cooked dried beans, peas, and lentils.  Foods high in purines: Anchovies, sardines, herring, mussels, tuna, codfish, scallops, trout, and haddock. Tomasa Blase. Organ meats (such as liver or kidney). Tripe. Game meat. Goose. Sweetbreads. Dairy  All dairy foods are low in purines. Low-fat and fat-free dairy products are best because they are low in saturated fat. Beverages  Drinks low in purines: Water, carbonated beverages, tea, coffee, cocoa.  Drinks moderate in purines: Soft drinks and other drinks sweetened with high-fructose corn syrup. Juices. To find whether a food or drink is sweetened with high-fructose corn syrup, look at the ingredients list.  Drinks high in purines: Alcoholic beverages (such as beer). Condiments  Foods low in purines: Salt, herbs, olives, pickles, relishes, vinegar.  Foods moderate in purines: Butter, margarine, oils, mayonnaise. Fats and Oils  Foods low in purines: All types, except gravies and sauces made with meat.  Foods high in purines: Gravies and sauces made with meat. Other Foods  Foods low in purines: Sugars, sweets, gelatin. Cake. Soups made without meat.  Foods moderate in purines: Meat-based or fish-based soups,  broths, or bouillons. Foods and drinks sweetened with high-fructose corn syrup.  Foods high in purines: High-fat desserts (such as ice cream, cookies, cakes, pies, doughnuts, and chocolate). Contact your dietitian for more information on foods that are not listed here. This information is not intended to replace advice given to you by your health care provider.  Make sure you discuss any questions you have with your health care provider. Document Released: 11/13/2010 Document Revised: 12/25/2015 Document Reviewed: 06/25/2013 Elsevier Interactive Patient Education  2017 [employer].

## 2018-04-25 NOTE — Assessment & Plan Note (Signed)
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 Pain-free now on allopurinol 300 daily and colchicine 0.6 daily. Most recent uric acid levels were well controlled. Return as needed.

## 2018-04-25 NOTE — Progress Notes (Signed)
Subjective:    CC: Recheck ankle  HPI: This is a pleasant 72 year old female, I saw her about a month ago with severe right ankle pain and swelling, I did an immediate arthrocentesis, still analysis was positive for calcium pyrophosphate crystals and uric acid crystals.  We started allopurinol, her recheck uric acid was well-controlled, continue to have pain so we added colchicine, she now is pain-free.  Happy with how things are going.  I reviewed the past medical history, family history, social history, surgical history, and allergies today and no changes were needed.  Please see the problem list section below in epic for further details.  Past Medical History: Past Medical History:  Diagnosis Date  . Arthritis    "qwhere" (06/06/2017)  . Chronic bilateral low back pain   . CKD (chronic kidney disease) stage 3, GFR 30-59 ml/min (HCC) 09/21/2012  . Complication of anesthesia    slow to wake up "once" (06/06/2017)  . DDD (degenerative disc disease), cervical   . Dysrhythmia   . Esophageal stricture   . GERD 10/04/2007   Qualifier: Diagnosis of  By: Linford ArnoldMetheney MD, Santina Evansatherine    . HIATAL HERNIA WITH REFLUX 10/04/2007   Qualifier: Diagnosis of  By: Linford ArnoldMetheney MD, Santina Evansatherine    . History of hiatal hernia   . History of kidney stones   . Hyperlipidemia 08/10/2010   Qualifier: Diagnosis of  By: Linford ArnoldMetheney MD, Santina Evansatherine    . Hypertension   . HYPERTENSION, BENIGN ESSENTIAL 04/27/2007   Qualifier: Diagnosis of  By: Linford ArnoldMetheney MD, Santina Evansatherine    . Impaired fasting glucose 08/10/2010   Qualifier: Diagnosis of  By: Linford ArnoldMetheney MD, Santina Evansatherine    . Lobar pneumonia (HCC)    "never missed a day of work w/it"  . Other specified cardiac arrhythmias 01/02/2015  . 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 Laurier NancyZann Tyson, [facility]   . Seborrheic dermatitis 06/12/2014   Past Surgical History: Past Surgical History:  Procedure  Laterality Date  . ABDOMINAL HYSTERECTOMY    . BACK SURGERY    . BLADDER SUSPENSION    . CARPAL TUNNEL RELEASE Bilateral   . CYSTOSCOPY W/ STONE MANIPULATION    . DILATION AND CURETTAGE OF UTERUS    . ESOPHAGOGASTRODUODENOSCOPY (EGD) WITH ESOPHAGEAL DILATION  X 1  . GUM SURGERY     ramus frame implant lower gum  . INSERT / REPLACE / REMOVE PACEMAKER  08/2012  . JOINT REPLACEMENT    . LUMBAR DISC SURGERY  X 2  . TARSAL TUNNEL RELEASE Bilateral   . TONSILLECTOMY    . TOTAL HIP ARTHROPLASTY Left 06/06/2017  . TOTAL HIP ARTHROPLASTY Left 06/06/2017   Procedure: TOTAL HIP ARTHROPLASTY ANTERIOR APPROACH;  Surgeon: Gean Birchwoodowan, Frank, MD;  Location: [facility] OR;  Service: Orthopedics;  Laterality: Left;  . TOTAL KNEE ARTHROPLASTY Left 2006  . TOTAL KNEE ARTHROPLASTY Right 11/30/2017   Procedure: TOTAL KNEE ARTHROPLASTY;  Surgeon: Gean Birchwoodowan, Frank, MD;  [facility];  Service: Orthopedics;  Laterality: Right;  . TOTAL KNEE REVISION Left 2007   new hardware  due to staph infection, on chronic keflex  . TRANSTHORACIC ECHOCARDIOGRAM     08/14/12 [facility] NL LVF, no segmental abnormality, borderline LVH, mild MR, mild MR, trace TR   Social History: Social History   Socioeconomic History  . Marital status: Widowed    Spouse name: Not on file  . Number of children: Not on file  .  Years of education: Not on file  . Highest education level: Not on file  Occupational History  . Not on file  Social Needs  . Financial resource strain: Not on file  . Food insecurity:    Worry: Not on file    Inability: Not on file  . Transportation needs:    Medical: Not on file    Non-medical: Not on file  Tobacco Use  . Smoking status: Never Smoker  . Smokeless tobacco: Never Used  Substance and Sexual Activity  . Alcohol use: No  . Drug use: No  . Sexual activity: Not on file  Lifestyle  . Physical activity:    Days per week: Not on file    Minutes per session: Not on file  . Stress: Not on file    Relationships  . Social connections:    Talks on phone: Not on file    Gets together: Not on file    Attends religious service: Not on file    Active member of club or organization: Not on file    Attends meetings of clubs or organizations: Not on file    Relationship status: Not on file  Other Topics Concern  . Not on file  Social History Narrative  . Not on file   Family History: Family History  Problem Relation Age of Onset  . Heart attack Mother   . Diabetes Mother 36  . Hypertension Mother   . Hypertension Father    Allergies: No Known Allergies Medications: See med rec.  Review of Systems: No fevers, chills, night sweats, weight loss, chest pain, or shortness of breath.   Objective:    General: Well Developed, well nourished, and in no acute distress.  Neuro: Alert and oriented x3, extra-ocular muscles intact, sensation grossly intact.  HEENT: Normocephalic, atraumatic, pupils equal round reactive to light, neck supple, no masses, no lymphadenopathy, thyroid nonpalpable.  Skin: Warm and dry, no rashes. Cardiac: Regular rate and rhythm, no murmurs rubs or gallops, no lower extremity edema.  Respiratory: Clear to auscultation bilaterally. Not using accessory muscles, speaking in full sentences.  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 Pain-free now on allopurinol 300 daily and colchicine 0.6 daily. Most recent uric acid levels were well controlled. Return as needed.  I spent 25 minutes with this patient, greater than 50% was face-to-face time counseling regarding the above diagnoses, specifically discussing the pathophysiology of gout and pseudogout as well as anticipatory guidance regarding those disease processes. ___________________________________________ Ihor Austin. Benjamin Stain, M.D., ABFM.,  CAQSM. Primary Care and Sports Medicine [facility]  Adjunct Instructor of Family Medicine  [employer]

## 2018-05-01 NOTE — Addendum Note (Signed)
Addended by: Deno Etienne on: 05/01/2018 01:38 PM   Modules accepted: Orders

## 2018-05-05 ENCOUNTER — Telehealth: Payer: Self-pay

## 2018-05-05 DIAGNOSIS — M119 Crystal arthropathy, unspecified: Secondary | ICD-10-CM

## 2018-05-05 DIAGNOSIS — M6580 Other synovitis and tenosynovitis, unspecified site: Principal | ICD-10-CM

## 2018-05-05 MED ORDER — COLCHICINE 0.6 MG PO TABS: 0.6000 mg | ORAL_TABLET | Freq: Every day | ORAL | 2 refills | Status: DC

## 2018-05-05 NOTE — Telephone Encounter (Signed)
Yes let me try that.  Switching to Colcrys tablets.

## 2018-05-05 NOTE — Telephone Encounter (Signed)
The Colchicine cost too much. Maybe if it was switched to tablets.

## 2018-05-08 NOTE — Telephone Encounter (Signed)
Unable to leave a message, no voicemail.  

## 2018-05-29 ENCOUNTER — Ambulatory Visit: Payer: Medicare Other | Admitting: Family Medicine

## 2018-05-29 ENCOUNTER — Other Ambulatory Visit: Payer: Self-pay

## 2018-05-29 DIAGNOSIS — I10 Essential (primary) hypertension: Secondary | ICD-10-CM | POA: Insufficient documentation

## 2018-05-29 MED ORDER — LISINOPRIL 2.5 MG PO TABS: 2.5000 mg | ORAL_TABLET | Freq: Every day | ORAL | 0 refills | Status: DC

## 2018-05-29 NOTE — Progress Notes (Deleted)
Subjective:    CC: IFG, and BP ck up  HPI:  Impaired fasting glucose-no increased thirst or urination. No symptoms consistent with hypoglycemia.  Hypertension- Pt denies chest pain, SOB, dizziness, or heart palpitations.  Taking meds as directed w/o problems.  Denies medication side effects.    F/U CKD 3 -   Past medical history, Surgical history, Family history not pertinant except as noted below, Social history, Allergies, and medications have been entered into the medical record, reviewed, and corrections made.   Review of Systems: No fevers, chills, night sweats, weight loss, chest pain, or shortness of breath.   Objective:    General: Well Developed, well nourished, and in no acute distress.  Neuro: Alert and oriented x3, extra-ocular muscles intact, sensation grossly intact.  HEENT: Normocephalic, atraumatic  Skin: Warm and dry, no rashes. Cardiac: Regular rate and rhythm, no murmurs rubs or gallops, no lower extremity edema.  Respiratory: Clear to auscultation bilaterally. Not using accessory muscles, speaking in full sentences.   Impression and Recommendations:    IFG -  ' HTN -   CKD 3 -

## 2018-06-14 ENCOUNTER — Other Ambulatory Visit: Payer: Self-pay | Admitting: Physician Assistant

## 2018-06-14 ENCOUNTER — Other Ambulatory Visit: Payer: Self-pay

## 2018-06-14 DIAGNOSIS — I10 Essential (primary) hypertension: Secondary | ICD-10-CM

## 2018-06-14 MED ORDER — DESONIDE 0.05 % EX CREA: TOPICAL_CREAM | CUTANEOUS | 0 refills | Status: DC

## 2018-06-14 NOTE — Telephone Encounter (Signed)
Samantha RhodesBetty wants a refill on Desonide. Not on current medication list. Please advise.

## 2018-06-15 ENCOUNTER — Ambulatory Visit: Payer: Medicare Other | Admitting: Family Medicine

## 2018-06-15 ENCOUNTER — Encounter: Payer: Self-pay | Admitting: Family Medicine

## 2018-06-15 VITALS — BP 130/84 | HR 111 | Ht 65.0 in | Wt 182.0 lb

## 2018-06-15 DIAGNOSIS — L659 Nonscarring hair loss, unspecified: Secondary | ICD-10-CM | POA: Diagnosis not present

## 2018-06-15 DIAGNOSIS — E785 Hyperlipidemia, unspecified: Secondary | ICD-10-CM | POA: Diagnosis not present

## 2018-06-15 DIAGNOSIS — R7301 Impaired fasting glucose: Secondary | ICD-10-CM

## 2018-06-15 DIAGNOSIS — I10 Essential (primary) hypertension: Secondary | ICD-10-CM | POA: Diagnosis not present

## 2018-06-15 LAB — POCT GLYCOSYLATED HEMOGLOBIN (HGB A1C): HEMOGLOBIN A1C: 5.7 % — AB (ref 4.0–5.6)

## 2018-06-15 MED ORDER — MINOXIDIL 2 % EX SOLN: Freq: Two times a day (BID) | CUTANEOUS | 5 refills | Status: DC

## 2018-06-15 NOTE — Progress Notes (Signed)
Subjective:    CC: BP and glucose  HPI:  Hypertension- Pt denies chest pain, SOB, dizziness, or heart palpitations.  Taking meds as directed w/o problems.  Denies medication side effects.    Impaired fasting glucose-no increased thirst or urination. No symptoms consistent with hypoglycemia.  Was recently diagnosed with gout and/or pseudogout and has been placed on allopurinol.  She has significant joint pain that makes it difficult for her to do things like shopping etc. but she usually pushes through it.  She is not currently taking anything for pain.  She was recently diagnosed with a perforated peptic ulcer.  She had a prepyloric gastric ulcer repair on Dec 08, 2017.  Some Protonix.  Is also concerned because she has been losing hair since she has had multiple surgeries this year she says is been going on for about 9 months and wants to know if there is anything she can do to treat it.  She is to be getting better she is not having spots of baldness.  Past medical history, Surgical history, Family history not pertinant except as noted below, Social history, Allergies, and medications have been entered into the medical record, reviewed, and corrections made.   Review of Systems: No fevers, chills, night sweats, weight loss, chest pain, or shortness of breath.   Objective:    General: Well Developed, well nourished, and in no acute distress.  Neuro: Alert and oriented x3, extra-ocular muscles intact, sensation grossly intact.  HEENT: Normocephalic, atraumatic  Skin: Warm and dry, no rashes. Cardiac: Regular rate and rhythm, no murmurs rubs or gallops, no lower extremity edema.  Respiratory: Clear to auscultation bilaterally. Not using accessory muscles, speaking in full sentences.   Impression and Recommendations:    HTN -repeat blood pressure much better we will keep an eye on this.  Follow-up in 4 months.  IFG  -looks fantastic.  Hemoglobin A1c 5.7 today which is great.  Is almost  back into the normal range.  She is not currently on medication.  Osteoarthritis s-okay to use Tylenol.  This will not cause stomach ulcers and does not affect kidney function.  Hyperlipidemia-she is due to recheck lipid levels.  Hair Loss -Minott trial of women's Rogaine.  Prescription sent to pharmacy.

## 2018-07-09 ENCOUNTER — Other Ambulatory Visit: Payer: Self-pay | Admitting: Family Medicine

## 2018-07-11 IMAGING — RF DG C-ARM 61-120 MIN
1 series · 2 of 2 positions shown · non-contrast
Comparison: None.

CLINICAL DATA: 71-year-old female left hip replacement. Initial
encounter.

EXAM:
DG C-ARM 61-120 MIN

[Series 1: run · 2 of 2 slices shown]
[im 1/2]
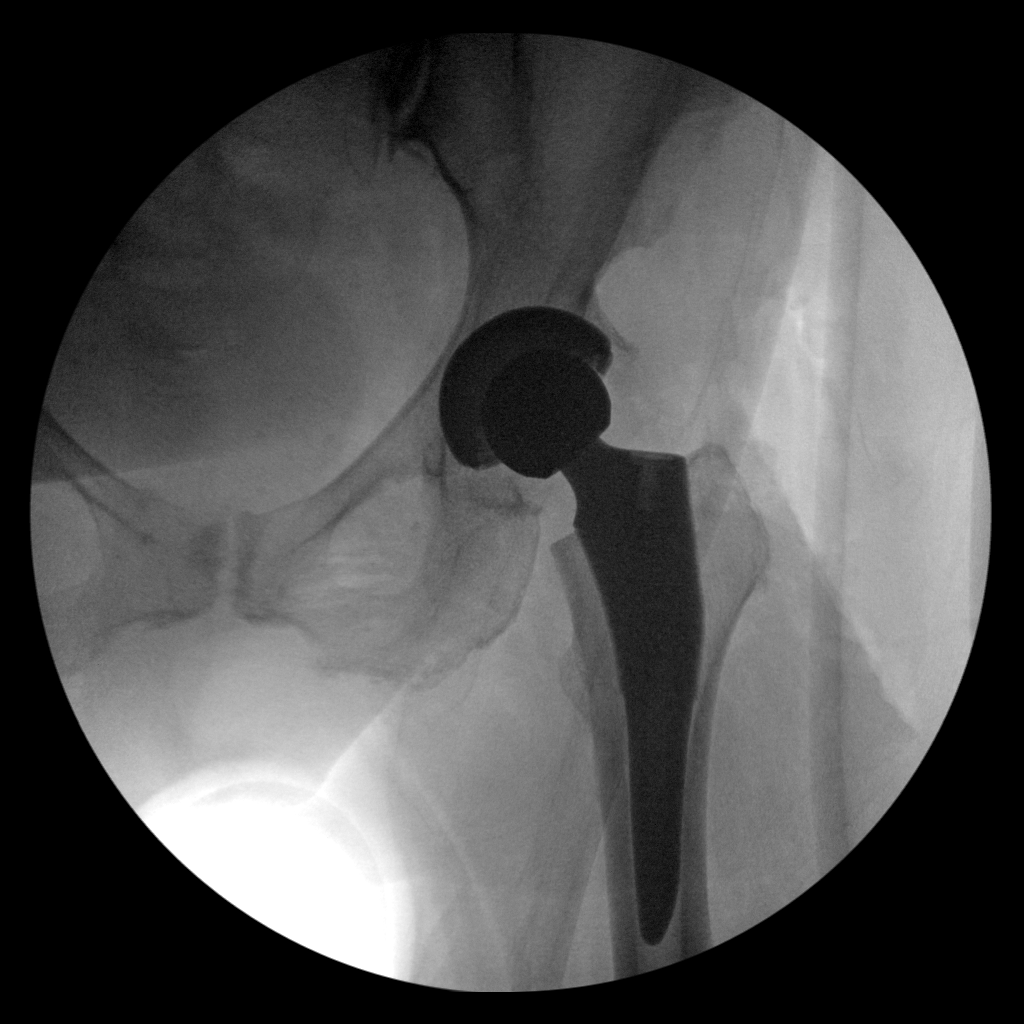
[im 2/2]
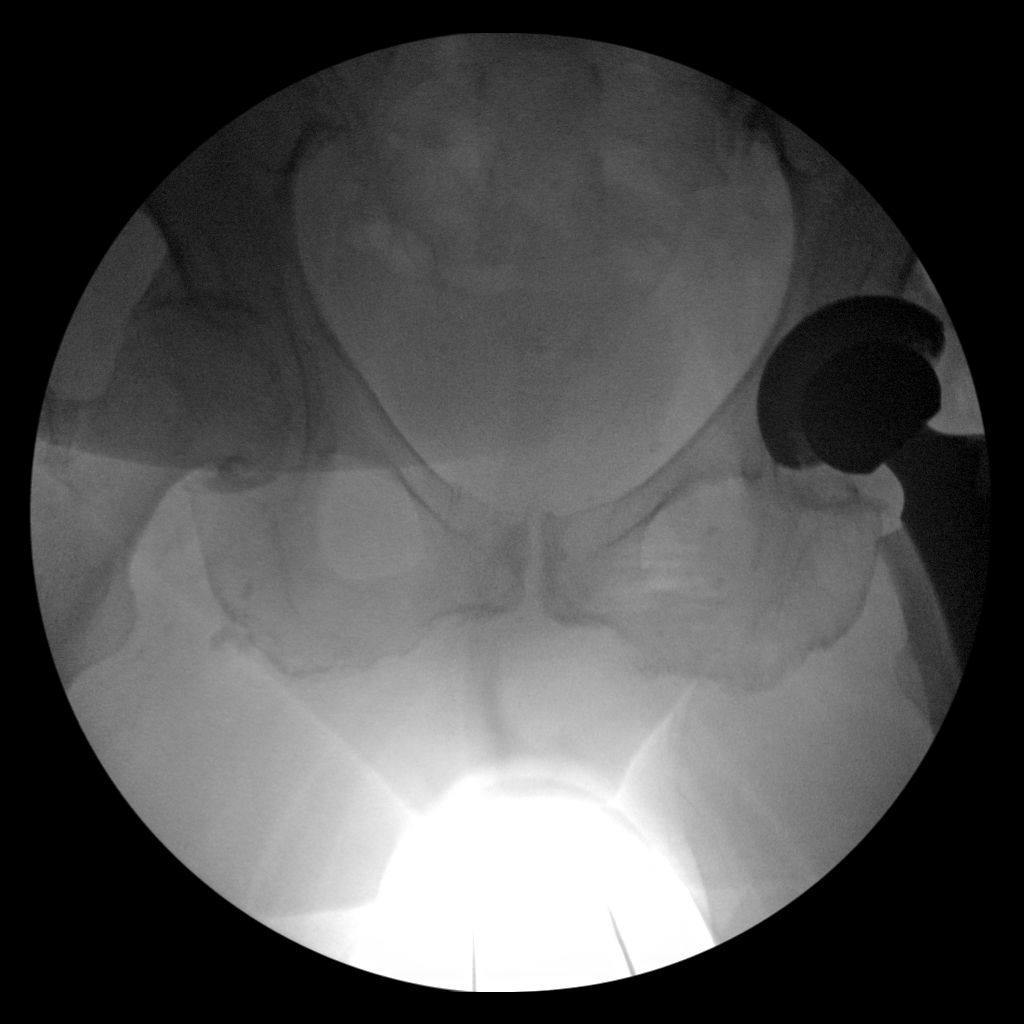

[2 of 2 positions shown; findings below may reference images not displayed]

FINDINGS: Post total left hip replacement which appears in satisfactory
position on AP projection.

Degenerative changes left sacroiliac joint and pubic symphysis.
IMPRESSION: Post total left hip replacement which appears in satisfactory
position on AP projection.

## 2018-07-17 ENCOUNTER — Other Ambulatory Visit: Payer: Self-pay

## 2018-07-17 DIAGNOSIS — I10 Essential (primary) hypertension: Secondary | ICD-10-CM

## 2018-07-17 MED ORDER — AMLODIPINE BESYLATE 5 MG PO TABS: 5.0000 mg | ORAL_TABLET | Freq: Every day | ORAL | 1 refills | Status: DC

## 2018-08-08 ENCOUNTER — Other Ambulatory Visit: Payer: Self-pay | Admitting: Sports Medicine

## 2018-08-08 DIAGNOSIS — M119 Crystal arthropathy, unspecified: Secondary | ICD-10-CM

## 2018-08-08 DIAGNOSIS — M6580 Other synovitis and tenosynovitis, unspecified site: Principal | ICD-10-CM

## 2018-09-26 ENCOUNTER — Encounter: Payer: Self-pay | Admitting: Family Medicine

## 2018-09-26 ENCOUNTER — Ambulatory Visit: Payer: Medicare Other | Admitting: Family Medicine

## 2018-09-26 VITALS — BP 136/82 | HR 113 | Temp 98.9°F | Ht 65.0 in | Wt 177.0 lb

## 2018-09-26 DIAGNOSIS — J019 Acute sinusitis, unspecified: Secondary | ICD-10-CM | POA: Diagnosis not present

## 2018-09-26 DIAGNOSIS — R509 Fever, unspecified: Secondary | ICD-10-CM

## 2018-09-26 LAB — POCT INFLUENZA A/B
INFLUENZA A, POC: NEGATIVE
INFLUENZA B, POC: NEGATIVE

## 2018-09-26 MED ORDER — BENZONATATE 200 MG PO CAPS: 200.0000 mg | ORAL_CAPSULE | Freq: Three times a day (TID) | ORAL | 0 refills | Status: DC | PRN

## 2018-09-26 MED ORDER — AMOXICILLIN-POT CLAVULANATE 875-125 MG PO TABS: 1.0000 | ORAL_TABLET | Freq: Two times a day (BID) | ORAL | 0 refills | Status: DC

## 2018-09-26 NOTE — Progress Notes (Signed)
Acute Office Visit  Subjective:    Patient ID: [name], female    DOB: 1946-06-11, 73 y.o.   MRN: [id number]  Chief Complaint  Patient presents with  . Cough    x 6 days, laryngitis     HPI Patient is in today for cough x 6 days.  Cough is intermittently productive.  She says she is coughed so hard it is made her throat hurt and had given her headaches and even to the point that she has vomited.  She also has significant nasal congestion and drainage.  She says it is worse if she tries to lay flat.  She denies having any fever but has had some chills.  She has not tried any over-the-counter cough medication.  No worsening or alleviating factors.  Also had some laryngitis.  Past Medical History:  Diagnosis Date  . Arthritis    "qwhere" (06/06/2017)  . Chronic bilateral low back pain   . CKD (chronic kidney disease) stage 3, GFR 30-59 ml/min (HCC) 09/21/2012  . Complication of anesthesia    slow to wake up "once" (06/06/2017)  . DDD (degenerative disc disease), cervical   . Dysrhythmia   . Esophageal stricture   . GERD 10/04/2007   Qualifier: Diagnosis of  By: Linford Arnold MD, Santina Evans    . HIATAL HERNIA WITH REFLUX 10/04/2007   Qualifier: Diagnosis of  By: Linford Arnold MD, Santina Evans    . History of hiatal hernia   . History of kidney stones   . Hyperlipidemia 08/10/2010   Qualifier: Diagnosis of  By: Linford Arnold MD, Santina Evans    . Hypertension   . HYPERTENSION, BENIGN ESSENTIAL 04/27/2007   Qualifier: Diagnosis of  By: Linford Arnold MD, Santina Evans    . Impaired fasting glucose 08/10/2010   Qualifier: Diagnosis of  By: Linford Arnold MD, Santina Evans    . Lobar pneumonia (HCC)    "never missed a day of work w/it"  . Other specified cardiac arrhythmias 01/02/2015  . 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 Laurier Nancy, [facility]   . Seborrheic dermatitis 06/12/2014    Past Surgical History:  Procedure  Laterality Date  . ABDOMINAL HYSTERECTOMY    . BACK SURGERY    . BLADDER SUSPENSION    . CARPAL TUNNEL RELEASE Bilateral   . CYSTOSCOPY W/ STONE MANIPULATION    . DILATION AND CURETTAGE OF UTERUS    . ESOPHAGOGASTRODUODENOSCOPY (EGD) WITH ESOPHAGEAL DILATION  X 1  . GUM SURGERY     ramus frame implant lower gum  . INSERT / REPLACE / REMOVE PACEMAKER  08/2012  . JOINT REPLACEMENT    . LUMBAR DISC SURGERY  X 2  . TARSAL TUNNEL RELEASE Bilateral   . TONSILLECTOMY    . TOTAL HIP ARTHROPLASTY Left 06/06/2017  . TOTAL HIP ARTHROPLASTY Left 06/06/2017   Procedure: TOTAL HIP ARTHROPLASTY ANTERIOR APPROACH;  Surgeon: [name], MD;  Location: [facility] OR;  Service: Orthopedics;  Laterality: Left;  . TOTAL KNEE ARTHROPLASTY Left 2006  . TOTAL KNEE ARTHROPLASTY Right 11/30/2017   Procedure: TOTAL KNEE ARTHROPLASTY;  Surgeon: [name], MD;  Location: [facility] OR;  Service: Orthopedics;  Laterality: Right;  . TOTAL KNEE REVISION Left 2007   new hardware  due to staph infection, on chronic keflex  . TRANSTHORACIC ECHOCARDIOGRAM     08/14/12 [facility]): NL LVF, no segmental abnormality, borderline LVH, mild MR, mild MR, trace TR  Family History  Problem Relation Age of Onset  . Heart attack Mother   . Diabetes Mother 64  . Hypertension Mother   . Hypertension Father     Social History   Socioeconomic History  . Marital status: Widowed    Spouse name: Not on file  . Number of children: Not on file  . Years of education: Not on file  . Highest education level: Not on file  Occupational History  . Not on file  Social Needs  . Financial resource strain: Not on file  . Food insecurity:    Worry: Not on file    Inability: Not on file  . Transportation needs:    Medical: Not on file    Non-medical: Not on file  Tobacco Use  . Smoking status: Never Smoker  . Smokeless tobacco: Never Used  Substance and Sexual Activity  . Alcohol use: No  . Drug use: No  . Sexual activity: Not  on file  Lifestyle  . Physical activity:    Days per week: Not on file    Minutes per session: Not on file  . Stress: Not on file  Relationships  . Social connections:    Talks on phone: Not on file    Gets together: Not on file    Attends religious service: Not on file    Active member of club or organization: Not on file    Attends meetings of clubs or organizations: Not on file    Relationship status: Not on file  . Intimate partner violence:    Fear of current or ex partner: Not on file    Emotionally abused: Not on file    Physically abused: Not on file    Forced sexual activity: Not on file  Other Topics Concern  . Not on file  Social History Narrative  . Not on file    Outpatient Medications Prior to Visit  Medication Sig Dispense Refill  . allopurinol (ZYLOPRIM) 300 MG tablet Take 1 tablet (300 mg total) by mouth daily. 30 tablet 6  . amLODipine (NORVASC) 5 MG tablet Take 1 tablet (5 mg total) by mouth daily. 90 tablet 1  . colchicine 0.6 MG tablet TAKE ONE TABLET BY MOUTH ONE TIME DAILY 30 tablet 2  . desonide (DESOWEN) 0.05 % cream APPLY TOPICALLY DAILY AS NEEDED. DO NOT USE FOR MORE THAN 2 WEEKS IN A ROW ON THE FACE 30 g 0  . hydrochlorothiazide (HYDRODIURIL) 12.5 MG tablet Take 1 tablet (12.5 mg total) by mouth daily. 90 tablet 0  . lisinopril (PRINIVIL,ZESTRIL) 2.5 MG tablet TAKE ONE TO TWO TABLETS BY MOUTH ONE TIME DAILY 90 tablet 2  . meclizine (ANTIVERT) 25 MG tablet Take 1 tablet (25 mg total) by mouth 3 (three) times daily as needed for dizziness. 30 tablet 2  . minoxidil (ROGAINE) 2 % external solution Apply topically 2 (two) times daily. 60 mL 5  . pantoprazole (PROTONIX) 40 MG tablet Take by mouth.    Marland Kitchen tiZANidine (ZANAFLEX) 2 MG tablet Take 1 tablet (2 mg total) by mouth every 6 (six) hours as needed. 60 tablet 0  . traZODone (DESYREL) 50 MG tablet Take 0.5-2 tablets (25-100 mg total) by mouth at bedtime as needed for sleep. 30 tablet 1   No  facility-administered medications prior to visit.     Allergies  Allergen Reactions  . Morphine     Other reaction(s): Delusions (intolerance)    ROS     Objective:  Physical Exam  Constitutional: She is oriented to person, place, and time. She appears well-developed and well-nourished.  HENT:  Head: Normocephalic and atraumatic.  Right Ear: External ear normal.  Left Ear: External ear normal.  Nose: Nose normal.  Mouth/Throat: Oropharynx is clear and moist.  TMs and canals are clear.   Eyes: Pupils are equal, round, and reactive to light. Conjunctivae and EOM are normal.  Neck: Neck supple. No thyromegaly present.  Cardiovascular: Normal rate, regular rhythm and normal heart sounds.  Pulmonary/Chest: Effort normal and breath sounds normal. She has no wheezes.  Lymphadenopathy:    She has no cervical adenopathy.  Neurological: She is alert and oriented to person, place, and time.  Skin: Skin is warm and dry.  Psychiatric: She has a normal mood and affect.    BP 136/82   Pulse (!) 113   Temp 98.9 F (37.2 C)   Ht 5\' 5"  (1.651 m)   Wt 177 lb (80.3 kg)   SpO2 98%   BMI 29.45 kg/m  Wt Readings from Last 3 Encounters:  09/26/18 177 lb (80.3 kg)  06/15/18 182 lb (82.6 kg)  03/28/18 173 lb (78.5 kg)    There are no preventive care reminders to display for this patient.  There are no preventive care reminders to display for this patient.   Lab Results  Component Value Date   TSH 0.56 03/23/2018   Lab Results  Component Value Date   WBC 11.2 (H) 03/23/2018   HGB 12.0 03/23/2018   HCT 36.7 03/23/2018   MCV 85.2 03/23/2018   PLT 417 (H) 03/23/2018   Lab Results  Component Value Date   NA 138 04/04/2018   K 4.7 04/04/2018   CO2 25 04/04/2018   GLUCOSE 92 04/04/2018   BUN 31 (H) 04/04/2018   CREATININE 1.37 (H) 04/04/2018   BILITOT 0.2 04/04/2018   ALKPHOS 32 (L) 12/07/2017   AST 11 04/04/2018   ALT 12 04/04/2018   PROT 6.3 04/04/2018   ALBUMIN 3.6  12/07/2017   CALCIUM 9.2 04/04/2018   ANIONGAP 13 12/07/2017   Lab Results  Component Value Date   CHOL 160 11/14/2013   Lab Results  Component Value Date   HDL 45 11/14/2013   Lab Results  Component Value Date   LDLCALC 86 11/14/2013   Lab Results  Component Value Date   TRIG 144 11/14/2013   Lab Results  Component Value Date   CHOLHDL 3.6 11/14/2013   Lab Results  Component Value Date   HGBA1C 5.7 (A) 06/15/2018       Assessment & Plan:   Problem List Items Addressed This Visit    None    Visit Diagnoses    Fever, unspecified fever cause    -  Primary   Relevant Orders   POCT Influenza A/B (Completed)   Acute non-recurrent sinusitis, unspecified location       Relevant Medications   amoxicillin-clavulanate (AUGMENTIN) 875-125 MG tablet   benzonatate (TESSALON) 200 MG capsule     Acute sinusitis-with postnasal drip causing persistent cough.  Chest is clear on exam today.  Recommend we will treat with Augmentin.  Call if not better in 1 week.  She feels like she has been getting progressively worse over the last few days.   Meds ordered this encounter  Medications  . amoxicillin-clavulanate (AUGMENTIN) 875-125 MG tablet    Sig: Take 1 tablet by mouth 2 (two) times daily.    Dispense:  20 tablet  Refill:  0  . benzonatate (TESSALON) 200 MG capsule    Sig: Take 1 capsule (200 mg total) by mouth 3 (three) times daily as needed for cough.    Dispense:  30 capsule    Refill:  0     Nani Gasseratherine Depaul Arizpe, MD

## 2018-09-26 NOTE — Patient Instructions (Signed)

## 2018-10-09 ENCOUNTER — Other Ambulatory Visit: Payer: Self-pay | Admitting: Sports Medicine

## 2018-10-09 DIAGNOSIS — M119 Crystal arthropathy, unspecified: Secondary | ICD-10-CM

## 2018-10-09 DIAGNOSIS — M6580 Other synovitis and tenosynovitis, unspecified site: Secondary | ICD-10-CM

## 2018-10-09 DIAGNOSIS — M25471 Effusion, right ankle: Secondary | ICD-10-CM

## 2018-11-08 ENCOUNTER — Other Ambulatory Visit: Payer: Self-pay | Admitting: Family Medicine

## 2018-11-10 ENCOUNTER — Other Ambulatory Visit: Payer: Self-pay | Admitting: Sports Medicine

## 2018-11-10 DIAGNOSIS — M6580 Other synovitis and tenosynovitis, unspecified site: Principal | ICD-10-CM

## 2018-11-10 DIAGNOSIS — M119 Crystal arthropathy, unspecified: Secondary | ICD-10-CM

## 2018-11-10 NOTE — Telephone Encounter (Signed)
I have not seen this patient in over 6 months, lets get her set up for a virtual visit.

## 2018-11-13 ENCOUNTER — Encounter: Payer: Self-pay | Admitting: Sports Medicine

## 2018-11-13 ENCOUNTER — Ambulatory Visit (INDEPENDENT_AMBULATORY_CARE_PROVIDER_SITE_OTHER): Payer: Medicare Other | Admitting: Sports Medicine

## 2018-11-13 DIAGNOSIS — M25471 Effusion, right ankle: Secondary | ICD-10-CM

## 2018-11-13 DIAGNOSIS — M119 Crystal arthropathy, unspecified: Secondary | ICD-10-CM

## 2018-11-13 DIAGNOSIS — M6580 Other synovitis and tenosynovitis, unspecified site: Secondary | ICD-10-CM

## 2018-11-13 MED ORDER — ALLOPURINOL 300 MG PO TABS: 300.0000 mg | ORAL_TABLET | Freq: Every day | ORAL | 1 refills | Status: DC

## 2018-11-13 MED ORDER — COLCHICINE 0.6 MG PO TABS: 0.6000 mg | ORAL_TABLET | Freq: Every day | ORAL | 1 refills | Status: DC

## 2018-11-13 NOTE — Telephone Encounter (Signed)
Patient scheduled.

## 2018-11-13 NOTE — Assessment & Plan Note (Signed)
Exquisitely well controlled with colchicine 0.6 daily and allopurinol 300 daily. Rechecking CBC, CMP, uric acid levels, refilling with 6 months of medication, I like to see her back in about 6 months.

## 2018-11-13 NOTE — Progress Notes (Signed)
Virtual Visit via Telephone   I connected with  [name]  on 11/13/18 by telephone and verified that I am speaking with the correct person using two identifiers.   I discussed the limitations, risks, security and privacy concerns of performing an evaluation and management service by telephone, including the higher likelihood of inaccurate diagnosis and treatment, and the availability of in person appointments.  We also discussed the likely need of an additional face to face encounter for complete and high quality delivery of care.  I also discussed with the patient that there may be a patient responsible charge related to this service. The patient expressed understanding and wishes to proceed.  Subjective:    CC: Gout.  HPI: This is a pleasant 73 year old female with gout and pseudogout, we have controlled her hyperuricemia with allopurinol, as well as her gout and pseudogout symptoms with colchicine.  She is extremely happy with how things are going, and would like to continue.  We have not checked routine labs in about 6 months.  I reviewed the past medical history, family history, social history, surgical history, and allergies today and no changes were needed.  Please see the problem list section below in epic for further details.  Past Medical History: Past Medical History:  Diagnosis Date  . Arthritis    "qwhere" (06/06/2017)  . Chronic bilateral low back pain   . CKD (chronic kidney disease) stage 3, GFR 30-59 ml/min (HCC) 09/21/2012  . Complication of anesthesia    slow to wake up "once" (06/06/2017)  . DDD (degenerative disc disease), cervical   . Dysrhythmia   . Esophageal stricture   . GERD 10/04/2007   Qualifier: Diagnosis of  By: Linford ArnoldMetheney MD, Santina Evansatherine    . HIATAL HERNIA WITH REFLUX 10/04/2007   Qualifier: Diagnosis of  By: Linford ArnoldMetheney MD, Santina Evansatherine    . History of hiatal hernia   . History of kidney stones   . Hyperlipidemia 08/10/2010   Qualifier: Diagnosis of  By:  Linford ArnoldMetheney MD, Santina Evansatherine    . Hypertension   . HYPERTENSION, BENIGN ESSENTIAL 04/27/2007   Qualifier: Diagnosis of  By: Linford ArnoldMetheney MD, Santina Evansatherine    . Impaired fasting glucose 08/10/2010   Qualifier: Diagnosis of  By: Linford ArnoldMetheney MD, Santina Evansatherine    . Lobar pneumonia (HCC)    "never missed a day of work w/it"  . Other specified cardiac arrhythmias 01/02/2015  . 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 Laurier NancyZann Tyson, [facility]   . Seborrheic dermatitis 06/12/2014   Past Surgical History: Past Surgical History:  Procedure Laterality Date  . ABDOMINAL HYSTERECTOMY    . BACK SURGERY    . BLADDER SUSPENSION    . CARPAL TUNNEL RELEASE Bilateral   . CYSTOSCOPY W/ STONE MANIPULATION    . DILATION AND CURETTAGE OF UTERUS    . ESOPHAGOGASTRODUODENOSCOPY (EGD) WITH ESOPHAGEAL DILATION  X 1  . GUM SURGERY     ramus frame implant lower gum  . INSERT / REPLACE / REMOVE PACEMAKER  08/2012  . JOINT REPLACEMENT    . LUMBAR DISC SURGERY  X 2  . TARSAL TUNNEL RELEASE Bilateral   . TONSILLECTOMY    . TOTAL HIP ARTHROPLASTY Left 06/06/2017  . TOTAL HIP ARTHROPLASTY Left 06/06/2017   Procedure: TOTAL HIP ARTHROPLASTY ANTERIOR APPROACH;  Surgeon: Gean Birchwoodowan, Frank, MD;  Location: [facility] OR;  Service: Orthopedics;  Laterality: Left;  . TOTAL KNEE ARTHROPLASTY Left  2006  . TOTAL KNEE ARTHROPLASTY Right 11/30/2017   Procedure: TOTAL KNEE ARTHROPLASTY;  Surgeon: [name], MD;  Location: [facility] OR;  Service: Orthopedics;  Laterality: Right;  . TOTAL KNEE REVISION Left 2007   new hardware  due to staph infection, on chronic keflex  . TRANSTHORACIC ECHOCARDIOGRAM     08/14/12 [facility]): NL LVF, no segmental abnormality, borderline LVH, mild MR, mild MR, trace TR   Social History: Social History   Socioeconomic History  . Marital status: Widowed    Spouse name: Not on file  . Number of children: Not on file  . Years of  education: Not on file  . Highest education level: Not on file  Occupational History  . Not on file  Social Needs  . Financial resource strain: Not on file  . Food insecurity:    Worry: Not on file    Inability: Not on file  . Transportation needs:    Medical: Not on file    Non-medical: Not on file  Tobacco Use  . Smoking status: Never Smoker  . Smokeless tobacco: Never Used  Substance and Sexual Activity  . Alcohol use: No  . Drug use: No  . Sexual activity: Not on file  Lifestyle  . Physical activity:    Days per week: Not on file    Minutes per session: Not on file  . Stress: Not on file  Relationships  . Social connections:    Talks on phone: Not on file    Gets together: Not on file    Attends religious service: Not on file    Active member of club or organization: Not on file    Attends meetings of clubs or organizations: Not on file    Relationship status: Not on file  Other Topics Concern  . Not on file  Social History Narrative  . Not on file   Family History: Family History  Problem Relation Age of Onset  . Heart attack Mother   . Diabetes Mother 79  . Hypertension Mother   . Hypertension Father    Allergies: Allergies  Allergen Reactions  . Morphine     Other reaction(s): Delusions (intolerance)   Medications: See med rec.  Review of Systems: No fevers, chills, night sweats, weight loss, chest pain, or shortness of breath.   Objective:    General: Speaking full sentences, no audible heavy breathing.  Sounds alert and appropriately interactive.  No other physical exam performed due to the non-face to face nature of this visit.  Impression and Recommendations:    Gout and pseudogout, right ankle Exquisitely well controlled with colchicine 0.6 daily and allopurinol 300 daily. Rechecking CBC, CMP, uric acid levels, refilling with 6 months of medication, I like to see her back in about 6 months.  I discussed the above assessment and treatment  plan with the patient. The patient was provided an opportunity to ask questions and all were answered. The patient agreed with the plan and demonstrated an understanding of the instructions.   The patient was advised to call back or seek an in-person evaluation if the symptoms worsen or if the condition fails to improve as anticipated.   I provided 21 minutes of non-face-to-face time during this encounter, less than 50% of this was time needed to gather information, review chart, records, and complete documentation.   ___________________________________________ Ihor Austin. Benjamin Stain, M.D., ABFM., CAQSM. Primary Care and Sports Medicine [facility]  Adjunct Professor of [facility] Medicine  [employer]

## 2018-11-15 LAB — COMPREHENSIVE METABOLIC PANEL
AG Ratio: 2.1 (calc) (ref 1.0–2.5)
ALT: 18 U/L (ref 6–29)
AST: 18 U/L (ref 10–35)
Albumin: 4.1 g/dL (ref 3.6–5.1)
Alkaline phosphatase (APISO): 35 U/L — ABNORMAL LOW (ref 37–153)
BUN/Creatinine Ratio: 20 (calc) (ref 6–22)
BUN: 27 mg/dL — ABNORMAL HIGH (ref 7–25)
CO2: 26 mmol/L (ref 20–32)
Calcium: 9.6 mg/dL (ref 8.6–10.4)
Chloride: 105 mmol/L (ref 98–110)
Creat: 1.37 mg/dL — ABNORMAL HIGH (ref 0.60–0.93)
Globulin: 2 g/dL (calc) (ref 1.9–3.7)
Glucose, Bld: 116 mg/dL (ref 65–139)
Potassium: 4.5 mmol/L (ref 3.5–5.3)
Sodium: 141 mmol/L (ref 135–146)
Total Bilirubin: 0.4 mg/dL (ref 0.2–1.2)
Total Protein: 6.1 g/dL (ref 6.1–8.1)

## 2018-11-15 LAB — CBC
HCT: 39.4 % (ref 35.0–45.0)
Hemoglobin: 13.2 g/dL (ref 11.7–15.5)
MCH: 30.7 pg (ref 27.0–33.0)
MCHC: 33.5 g/dL (ref 32.0–36.0)
MCV: 91.6 fL (ref 80.0–100.0)
MPV: 9.4 fL (ref 7.5–12.5)
Platelets: 370 10*3/uL (ref 140–400)
RBC: 4.3 10*6/uL (ref 3.80–5.10)
RDW: 15 % (ref 11.0–15.0)
WBC: 7.9 10*3/uL (ref 3.8–10.8)

## 2018-11-15 LAB — URIC ACID: Uric Acid, Serum: 4.2 mg/dL (ref 2.5–7.0)

## 2019-02-05 ENCOUNTER — Other Ambulatory Visit: Payer: Self-pay | Admitting: Family Medicine

## 2019-02-05 DIAGNOSIS — I10 Essential (primary) hypertension: Secondary | ICD-10-CM

## 2019-03-14 ENCOUNTER — Telehealth: Payer: Self-pay

## 2019-03-14 MED ORDER — PREDNISONE 50 MG PO TABS: ORAL_TABLET | ORAL | 0 refills | Status: DC

## 2019-03-14 NOTE — Telephone Encounter (Signed)
Patient's daughter advised.  

## 2019-03-14 NOTE — Telephone Encounter (Signed)
Thereasa Distance daughter, called and states Azana has muscle cramps/spasms in her lower back going down her legs for the last 2 days. She has been taking Tylenol without relief. Denies fever, chills or sweats. She wanted me to send the message to Dr Dianah Field and request a muscle relaxer. I offered an appointment. She states she is working and would like the message sent.

## 2019-03-14 NOTE — Telephone Encounter (Signed)
Because this is likely related to lumbar spinal stenosis prednisone would be a better bet initially.  Calling in a 5-day burst, if no better she needs to come to see me in person.

## 2019-03-15 NOTE — Telephone Encounter (Signed)
Patient called and wanted me to let you know "everything she'd heard about you was true AND MORE! Thank you so much for taking care of her!"

## 2019-03-16 NOTE — Telephone Encounter (Signed)
"  sunglasses eyes emoji"  Hehe.

## 2019-05-02 IMAGING — DX DG FOOT COMPLETE 3+V*R*
3 series · 3 of 3 positions shown · non-contrast
Comparison: None.

CLINICAL DATA: Right ankle swelling and foot pain, initial
encounter

EXAM:
RIGHT FOOT COMPLETE - 3+ VIEW

[foot ap]
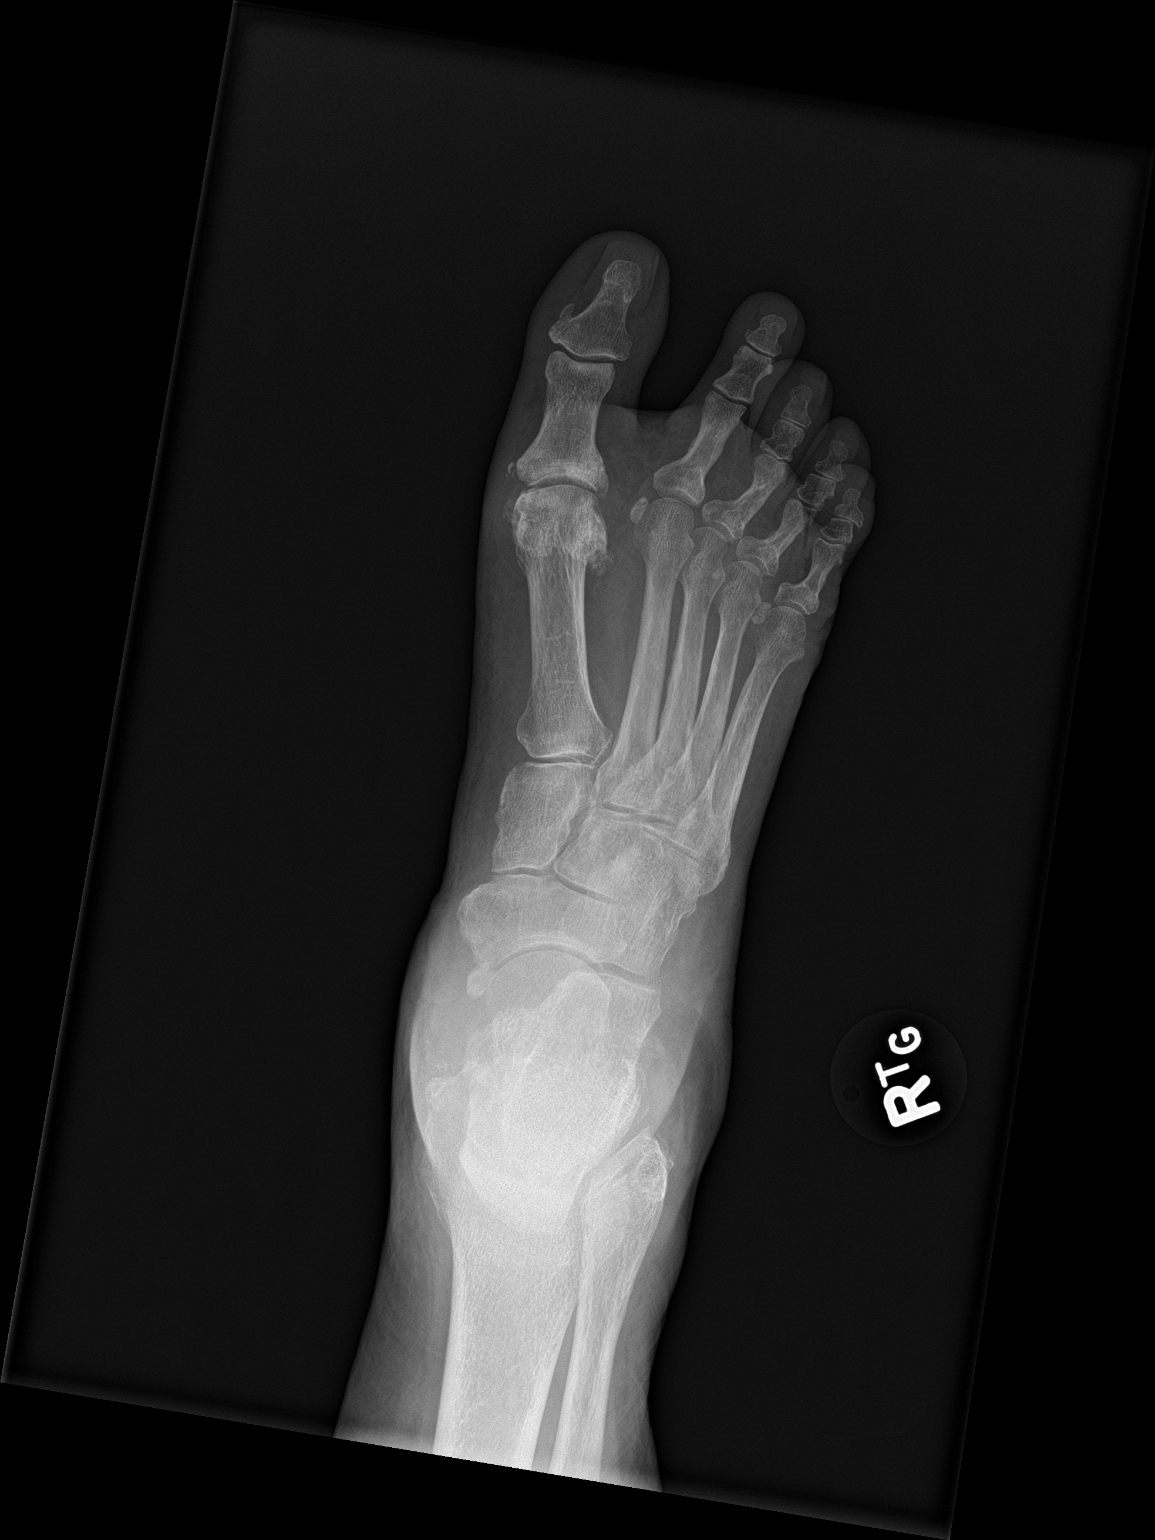

[foot obl]
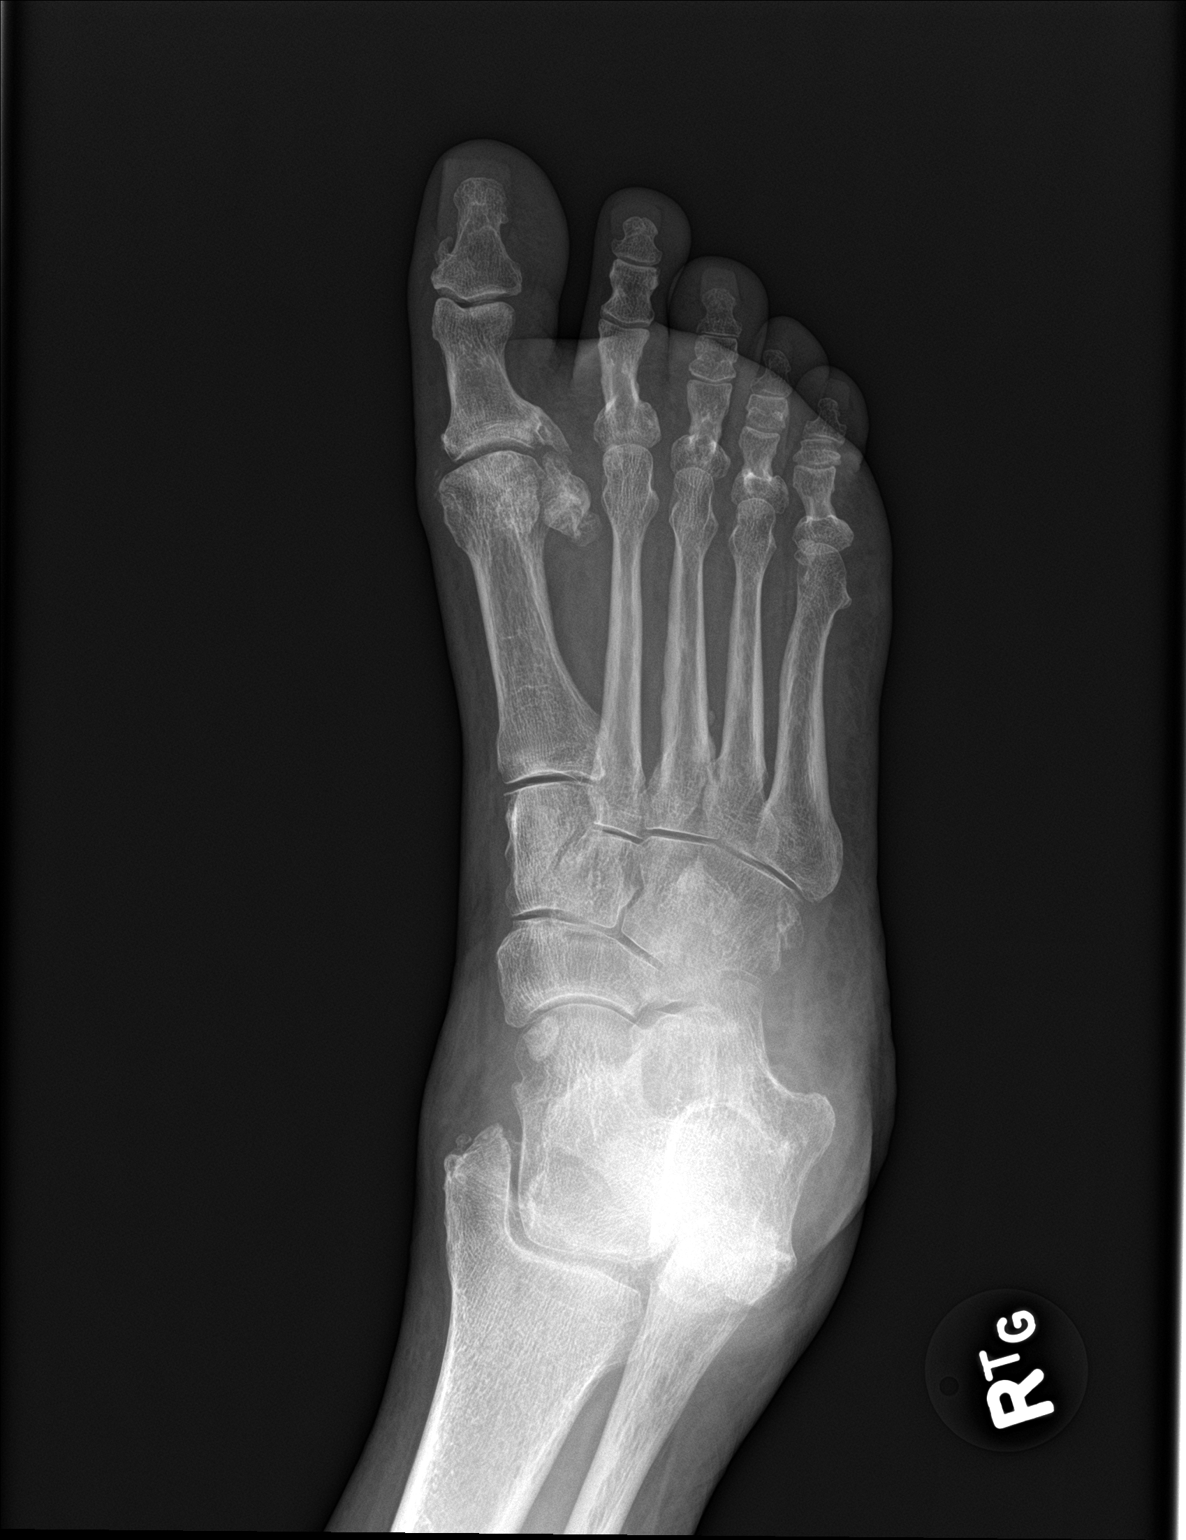

[foot lat]
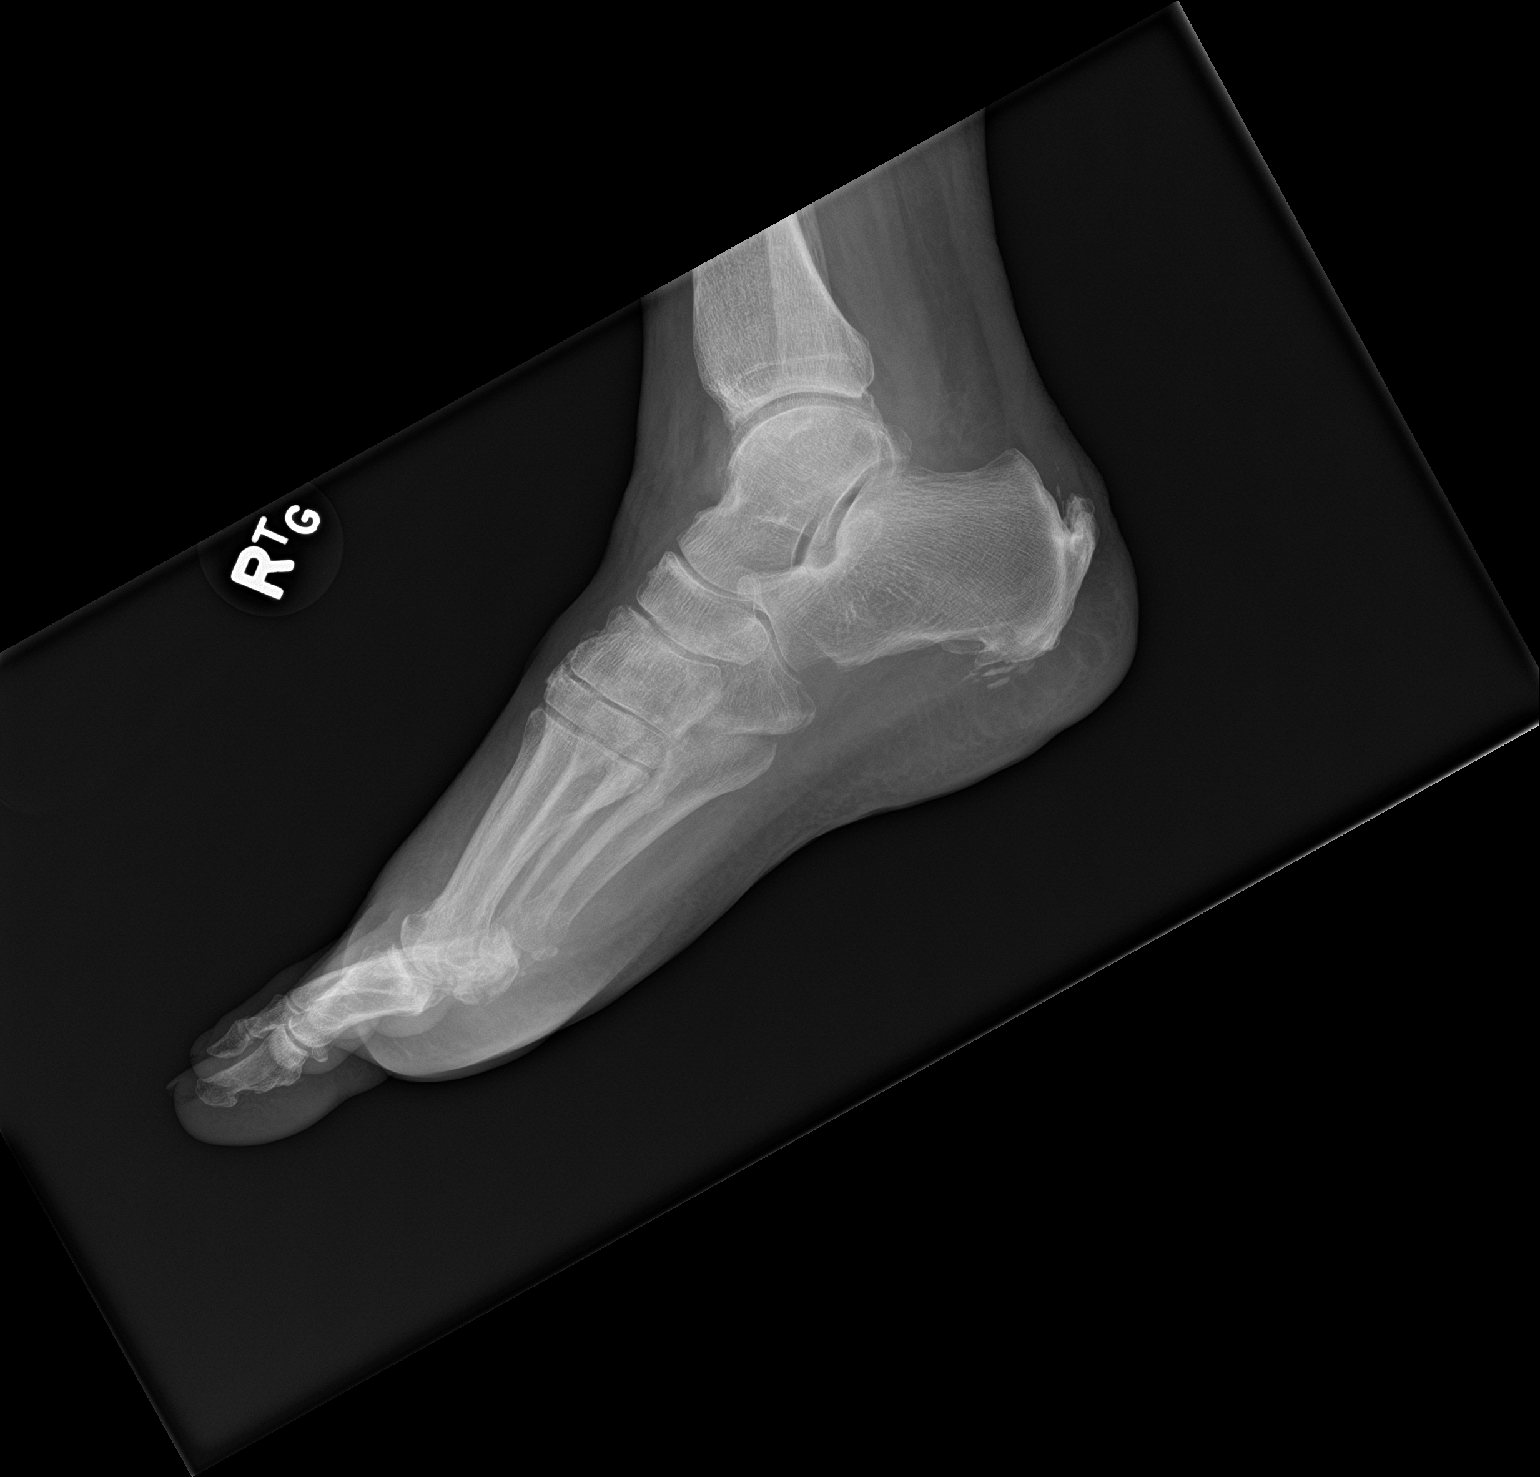

[3 of 3 positions shown; findings below may reference images not displayed]

FINDINGS: No acute fracture or dislocation is noted. Degenerative changes of
the tarsal bones as well as the first MTP joint are seen. No gross
soft tissue abnormality is noted. Calcaneal spurring is seen.
IMPRESSION: Degenerative changes without acute abnormality.

## 2019-05-15 ENCOUNTER — Other Ambulatory Visit: Payer: Self-pay | Admitting: Sports Medicine

## 2019-05-15 DIAGNOSIS — M119 Crystal arthropathy, unspecified: Secondary | ICD-10-CM

## 2019-05-15 DIAGNOSIS — M6580 Other synovitis and tenosynovitis, unspecified site: Secondary | ICD-10-CM

## 2019-07-31 ENCOUNTER — Other Ambulatory Visit: Payer: Self-pay | Admitting: Sports Medicine

## 2019-07-31 DIAGNOSIS — M6580 Other synovitis and tenosynovitis, unspecified site: Secondary | ICD-10-CM

## 2019-07-31 DIAGNOSIS — M25471 Effusion, right ankle: Secondary | ICD-10-CM

## 2019-07-31 DIAGNOSIS — M119 Crystal arthropathy, unspecified: Secondary | ICD-10-CM

## 2019-08-01 NOTE — Telephone Encounter (Signed)
Needs appointment

## 2019-08-16 DIAGNOSIS — H259 Unspecified age-related cataract: Secondary | ICD-10-CM | POA: Diagnosis not present

## 2019-08-22 ENCOUNTER — Telehealth: Payer: Self-pay

## 2019-08-22 NOTE — Telephone Encounter (Signed)
Maralyn Sago called wanting to know if an antibiotic will be sent to [facility].   [phone]

## 2019-08-22 NOTE — Telephone Encounter (Signed)
[name] reports having a sore throat and a cough from the drainage x 2 days. She states the pain feels like a knife in her throat. Denies fever, chills or sweats. Denies exposure to any person sick. She went to the grocery store yesterday. She would like an antibiotic sent to the pharmacy. She has been scheduled for tomorrow morning for a telephone visit. No openings today.

## 2019-08-23 ENCOUNTER — Encounter: Payer: Self-pay | Admitting: Medical-Surgical

## 2019-08-23 ENCOUNTER — Ambulatory Visit (INDEPENDENT_AMBULATORY_CARE_PROVIDER_SITE_OTHER): Payer: Medicare PPO | Admitting: Medical-Surgical

## 2019-08-23 DIAGNOSIS — J029 Acute pharyngitis, unspecified: Secondary | ICD-10-CM | POA: Diagnosis not present

## 2019-08-23 DIAGNOSIS — J3489 Other specified disorders of nose and nasal sinuses: Secondary | ICD-10-CM

## 2019-08-23 MED ORDER — IPRATROPIUM BROMIDE 0.03 % NA SOLN: 2.0000 | Freq: Two times a day (BID) | NASAL | 0 refills | Status: DC

## 2019-08-23 NOTE — Progress Notes (Signed)
Virtual Visit via Telephone   I connected with  [name]  on 08/23/19 by telephone/telehealth and verified that I am speaking with the correct person using two identifiers.   I discussed the limitations, risks, security and privacy concerns of performing an evaluation and management service by telephone, including the higher likelihood of inaccurate diagnosis and treatment, and the availability of in person appointments.  We also discussed the likely need of an additional face to face encounter for complete and high quality delivery of care.  I also discussed with the patient that there may be a patient responsible charge related to this service. The patient expressed understanding and wishes to proceed.  Provider location is either at home or medical facility. Patient location is at their home, different from provider location. People involved in care of the patient during this telehealth encounter were myself, my nurse/medical assistant, and my front office/scheduling team member.  CC: sore throat x 3 days  HPI: Pleasant 74 year old female presenting today via telephone to discuss sore throat, runny nose, postnasal drip, cough x3 days.  Cough productive of minimal amounts of clear sputum.    No shortness of breath, body aches, headaches, fever/chills, weakness, fatigue, chest congestion, nausea/vomiting/diarrhea, loss of taste/smell, decreased appetite, sinus congestion, facial/eye pain/pressure, ear pain, or sick contacts. Denies possible exposure to Covid. Has been taking Mucinex every 12 hours with some relief.  Also alternating Tylenol and ibuprofen as needed.   Review of Systems: No fevers, chills, night sweats, weight loss, chest pain, or shortness of breath.   Objective Findings:    General: Speaking full sentences, no audible heavy breathing.  Sounds alert and appropriately interactive.    Independent interpretation of tests performed by another provider:   None.  Impression  and Recommendations:    Viral pharyngitis/rhinorrhea Discussed OTC medications that may help with symptoms. Continue as needed Tylenol and ibuprofen.  May continue Mucinex every 12 hours as needed.  Atrovent nasal spray twice daily.   Return if symptoms worsen or fail to improve.  I discussed the above assessment and treatment plan with the patient. The patient was provided an opportunity to ask questions and all were answered. The patient agreed with the plan and demonstrated an understanding of the instructions.   The patient was advised to call back or seek an in-person evaluation if the symptoms worsen or if the condition fails to improve as anticipated.   25 minutes of non-face-to-face time was provided during this encounter.  Thayer Ohm, DNP, APRN, FNP-BC Primary Care and Sports Medicine [facility]  Over-the-Counter Medications & Home Remedies for Upper Respiratory Illness  Aches/Pains, Fever, Headache Acetaminophen (Tylenol) 500 mg tablets - take max 2 tablets (1000 mg) every 6 hours (4 times per day)  Ibuprofen (Motrin) 200 mg tablets - take max 4 tablets (800 mg) every 6 hours*  Sinus Congestion Prescription Atrovent as directed Cromolyn Nasal Spray (NasalCrom) 1 spray each nostril 3-4 times per day, max 6 imes per day Nasal Saline if desired Oxymetolazone (Afrin, others) sparing use due to rebound congestion, NEVER use in kids Diphenhydramine (Benadryl) 25 mg tablets - take max 2 tablets every 4 hours  Cough & Sore Throat Dextromethorphan (Robitussin, others) - cough suppressant Guaifenesin (Robitussin, Mucinex, others) - expectorant (helps cough up mucus) (Dextromethorphan and Guaifenesin also come in a combination tablet) Lozenges w/ Benzocaine + Menthol (Cepacol) Honey - as much as you want! Teas which "coat the throat" - look for ingredients [employer], Licorice  Root, Marshmallow Root  Other Zinc Lozenges within 24 hours of symptoms onset  - mixed evidence this shortens the duration of the common cold Don't waste your money on Vitamin C or Echinacea

## 2019-08-23 NOTE — Telephone Encounter (Signed)
It looks like she has an appointment this morning so the provider that she sees will determine whether or not she actually needs an antibiotic.  If she is only been sick for 2 days then the chances are less likely that she will actually need an antibiotic.

## 2019-08-23 NOTE — Telephone Encounter (Signed)
Appointment this morning

## 2019-09-10 ENCOUNTER — Other Ambulatory Visit: Payer: Self-pay | Admitting: Family Medicine

## 2019-10-31 DIAGNOSIS — M79645 Pain in left finger(s): Secondary | ICD-10-CM | POA: Diagnosis not present

## 2019-11-02 ENCOUNTER — Other Ambulatory Visit: Payer: Self-pay | Admitting: Sports Medicine

## 2019-11-02 DIAGNOSIS — M25471 Effusion, right ankle: Secondary | ICD-10-CM

## 2019-11-02 DIAGNOSIS — M119 Crystal arthropathy, unspecified: Secondary | ICD-10-CM

## 2019-11-05 DIAGNOSIS — M79645 Pain in left finger(s): Secondary | ICD-10-CM | POA: Diagnosis not present

## 2019-11-08 ENCOUNTER — Other Ambulatory Visit: Payer: Self-pay | Admitting: *Deleted

## 2019-11-08 ENCOUNTER — Telehealth: Payer: Self-pay | Admitting: *Deleted

## 2019-11-08 NOTE — Telephone Encounter (Signed)
Pharmacy left vm this afternoon stating that insurance will only cover brand Mitigare capsules but they are $40/ or $80/3 months.  That is too expensive for the pt so they would like for you to send in a cheaper alternative if possible.

## 2019-11-09 NOTE — Telephone Encounter (Signed)
There is really not a cheaper option other than occasional bursts of steroids or injections.

## 2019-11-12 NOTE — Telephone Encounter (Signed)
Tried calling pt; no answer or vm. 

## 2020-01-15 ENCOUNTER — Encounter: Payer: Self-pay | Admitting: Family Medicine

## 2020-01-15 ENCOUNTER — Ambulatory Visit (INDEPENDENT_AMBULATORY_CARE_PROVIDER_SITE_OTHER): Payer: Medicare PPO | Admitting: Family Medicine

## 2020-01-15 DIAGNOSIS — J069 Acute upper respiratory infection, unspecified: Secondary | ICD-10-CM | POA: Diagnosis not present

## 2020-01-15 MED ORDER — PREDNISONE 20 MG PO TABS: 20.0000 mg | ORAL_TABLET | Freq: Two times a day (BID) | ORAL | 0 refills | Status: DC

## 2020-01-15 MED ORDER — FLUTICASONE PROPIONATE 50 MCG/ACT NA SUSP: 2.0000 | Freq: Every day | NASAL | 6 refills | Status: DC

## 2020-01-15 NOTE — Progress Notes (Signed)
Sx x 2 - 3 weeks Cough, drainage, ears blocked  Took Allegra D.

## 2020-01-15 NOTE — Progress Notes (Signed)
[name] - 74 y.o. female MRN [id number]  Date of birth: [birth year]   This visit type was conducted due to national recommendations for restrictions regarding the COVID-19 Pandemic (e.g. social distancing).  This format is felt to be most appropriate for this patient at this time.  All issues noted in this document were discussed and addressed.  No physical exam was performed (except for noted visual exam findings with Video Visits).  I discussed the limitations of evaluation and management by telemedicine and the availability of in person appointments. The patient expressed understanding and agreed to proceed.  I connected with@ on 01/15/20 at  2:20 PM EDT by a video enabled telemedicine application and verified that I am speaking with the correct person using two identifiers.  Present at visit: Luetta Nutting, DO Leigh Aurora   Patient Location: Home [place] [address]   Provider location:   [facility]  Chief Complaint  Patient presents with  . Nasal Congestion    HPI  [name] is a 74 y.o. female who presents via audio/video conferencing for a telehealth visit today.  She has complaint of congestion with post nasal drainage, scratchy throat, bilateral ear pressure and cough.  Symptoms started about 2 weeks ago.  She denies fever, chills, shortness of breath,sinus pain, dizziness, weakness, or increased fatigue. She has tried Human resources officer d with only mild relief of symptoms.    ROS:  A comprehensive ROS was completed and negative except as noted per HPI  Past Medical History:  Diagnosis Date  . Arthritis    "qwhere" (06/06/2017)  . Chronic bilateral low back pain   . CKD (chronic kidney disease) stage 3, GFR 30-59 ml/min 09/21/2012  . Complication of anesthesia    slow to wake up "once" (06/06/2017)  . DDD (degenerative disc disease), cervical   . Dysrhythmia   . Esophageal stricture   . GERD 10/04/2007   Qualifier: Diagnosis of  By: Madilyn Fireman MD, Barnetta Chapel     . HIATAL HERNIA WITH REFLUX 10/04/2007   Qualifier: Diagnosis of  By: Madilyn Fireman MD, Barnetta Chapel    . History of hiatal hernia   . History of kidney stones   . Hyperlipidemia 08/10/2010   Qualifier: Diagnosis of  By: Madilyn Fireman MD, Barnetta Chapel    . Hypertension   . HYPERTENSION, BENIGN ESSENTIAL 04/27/2007   Qualifier: Diagnosis of  By: Madilyn Fireman MD, Barnetta Chapel    . Impaired fasting glucose 08/10/2010   Qualifier: Diagnosis of  By: Madilyn Fireman MD, Barnetta Chapel    . Lobar pneumonia ([place])    "never missed a day of work w/it"  . Other specified cardiac arrhythmias 01/02/2015  . 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 Lisa Roca, [facility]   . Seborrheic dermatitis 06/12/2014    Past Surgical History:  Procedure Laterality Date  . ABDOMINAL HYSTERECTOMY    . BACK SURGERY    . BLADDER SUSPENSION    . CARPAL TUNNEL RELEASE Bilateral   . CYSTOSCOPY W/ STONE MANIPULATION    . DILATION AND CURETTAGE OF UTERUS    . ESOPHAGOGASTRODUODENOSCOPY (EGD) WITH ESOPHAGEAL DILATION  X 1  . GUM SURGERY     ramus frame implant lower gum  . INSERT / REPLACE / REMOVE PACEMAKER  08/2012  . JOINT REPLACEMENT    . LUMBAR DISC SURGERY  X 2  . TARSAL TUNNEL RELEASE Bilateral   . TONSILLECTOMY    . TOTAL  HIP ARTHROPLASTY Left 06/06/2017  . TOTAL HIP ARTHROPLASTY Left 06/06/2017   Procedure: TOTAL HIP ARTHROPLASTY ANTERIOR APPROACH;  Surgeon: [name], MD;  Location: [facility] OR;  Service: Orthopedics;  Laterality: Left;  . TOTAL KNEE ARTHROPLASTY Left 2006  . TOTAL KNEE ARTHROPLASTY Right 11/30/2017   Procedure: TOTAL KNEE ARTHROPLASTY;  Surgeon: [name], MD;  Location: [facility] OR;  Service: Orthopedics;  Laterality: Right;  . TOTAL KNEE REVISION Left 2007   new hardware  due to staph infection, on chronic keflex  . TRANSTHORACIC ECHOCARDIOGRAM     08/14/12 [facility]): NL LVF, no segmental abnormality, borderline LVH,  mild MR, mild MR, trace TR    Family History  Problem Relation Age of Onset  . Heart attack Mother   . Diabetes Mother 69  . Hypertension Mother   . Hypertension Father     Social History   Socioeconomic History  . Marital status: Widowed    Spouse name: Not on file  . Number of children: Not on file  . Years of education: Not on file  . Highest education level: Not on file  Occupational History  . Not on file  Tobacco Use  . Smoking status: Never Smoker  . Smokeless tobacco: Never Used  Vaping Use  . Vaping Use: Never used  Substance and Sexual Activity  . Alcohol use: No  . Drug use: No  . Sexual activity: Not on file  Other Topics Concern  . Not on file  Social History Narrative  . Not on file   Social Determinants of Health   Financial Resource Strain:   . Difficulty of Paying Living Expenses:   Food Insecurity:   . Worried About Programme researcher, broadcasting/film/video in the Last Year:   . Barista in the Last Year:   Transportation Needs:   . Freight forwarder (Medical):   Marland Kitchen Lack of Transportation (Non-Medical):   Physical Activity:   . Days of Exercise per Week:   . Minutes of Exercise per Session:   Stress:   . Feeling of Stress :   Social Connections:   . Frequency of Communication with Friends and Family:   . Frequency of Social Gatherings with Friends and Family:   . Attends Religious Services:   . Active Member of Clubs or Organizations:   . Attends Banker Meetings:   Marland Kitchen Marital Status:   Intimate Partner Violence:   . Fear of Current or Ex-Partner:   . Emotionally Abused:   Marland Kitchen Physically Abused:   . Sexually Abused:      Current Outpatient Medications:  .  allopurinol (ZYLOPRIM) 300 MG tablet, TAKE ONE TABLET BY MOUTH ONE TIME DAILY, Disp: 90 tablet, Rfl: 0 .  amLODipine (NORVASC) 5 MG tablet, TAKE ONE TABLET BY MOUTH ONE TIME DAILY, Disp: 90 tablet, Rfl: 1 .  colchicine 0.6 MG tablet, TAKE ONE TABLET BY MOUTH ONE TIME DAILY, Disp:  90 tablet, Rfl: 1 .  desonide (DESOWEN) 0.05 % cream, APPLY TO AFFECTED AREA(S) TOPICALLY AS NEEDED, NOT TO BE USED FOR MORE THAN TWO WEEKS IN A ROW ON THE FACE, Disp: 30 g, Rfl: 0 .  hydrochlorothiazide (HYDRODIURIL) 12.5 MG tablet, Take 1 tablet (12.5 mg total) by mouth daily., Disp: 90 tablet, Rfl: 0 .  ipratropium (ATROVENT) 0.03 % nasal spray, Place 2 sprays into both nostrils every 12 (twelve) hours., Disp: 30 mL, Rfl: 0 .  lisinopril (ZESTRIL) 2.5 MG tablet, TAKE ONE TO TWO TABLETS BY  MOUTH ONE TIME DAILY, Disp: 90 tablet, Rfl: 2 .  meclizine (ANTIVERT) 25 MG tablet, Take 1 tablet (25 mg total) by mouth 3 (three) times daily as needed for dizziness., Disp: 30 tablet, Rfl: 2 .  pantoprazole (PROTONIX) 40 MG tablet, Take by mouth., Disp: , Rfl:  .  tiZANidine (ZANAFLEX) 2 MG tablet, Take 1 tablet (2 mg total) by mouth every 6 (six) hours as needed., Disp: 60 tablet, Rfl: 0 .  traZODone (DESYREL) 50 MG tablet, Take 0.5-2 tablets (25-100 mg total) by mouth at bedtime as needed for sleep., Disp: 30 tablet, Rfl: 1 .  fluticasone (FLONASE) 50 MCG/ACT nasal spray, Place 2 sprays into both nostrils daily., Disp: 16 g, Rfl: 6 .  predniSONE (DELTASONE) 20 MG tablet, Take 1 tablet (20 mg total) by mouth 2 (two) times daily with a meal., Disp: 10 tablet, Rfl: 0  EXAM:  VITALS per patient if applicable: Wt 177 lb (80.3 kg)   BMI 29.45 kg/m   GENERAL: alert, oriented, appears well and in no acute distress  HEENT: atraumatic, conjunttiva clear, no obvious abnormalities on inspection of external nose and ears  NECK: normal movements of the head and neck  LUNGS: on inspection no signs of respiratory distress, breathing rate appears normal, no obvious gross SOB, gasping or wheezing  CV: no obvious cyanosis  MS: moves all visible extremities without noticeable abnormality  PSYCH/NEURO: pleasant and cooperative, no obvious depression or anxiety, speech and thought processing grossly  intact  ASSESSMENT AND PLAN:  Discussed the following assessment and plan:  URI (upper respiratory infection) Continue supportive care at home.  Likely allergy component to this as well given prolonged congestion and scratchy feeling throat.  Will add daily flonase and 5 days course of prednisone.   She will let us know if symptoms are continuing to not improve.       I discussed the assessment and treatment plan with the patient. The patient was provided an opportunity to ask questions and all were answered. The patient agreed with the plan and demonstrated an understanding of the instructions.   The patient was advised to call back or seek an in-person evaluation if the symptoms worsen or if the condition fails to improve as anticipated.    Everrett Coombe, DO

## 2020-01-15 NOTE — Assessment & Plan Note (Signed)
Continue supportive care at home.  Likely allergy component to this as well given prolonged congestion and scratchy feeling throat.  Will add daily flonase and 5 days course of prednisone.   She will let us know if symptoms are continuing to not improve.

## 2020-01-21 ENCOUNTER — Encounter: Payer: Self-pay | Admitting: Family Medicine

## 2020-01-21 ENCOUNTER — Ambulatory Visit: Payer: Medicare PPO | Admitting: Family Medicine

## 2020-01-21 VITALS — BP 138/82 | HR 104 | Ht 65.0 in | Wt 197.0 lb

## 2020-01-21 DIAGNOSIS — M119 Crystal arthropathy, unspecified: Secondary | ICD-10-CM

## 2020-01-21 DIAGNOSIS — N1831 Chronic kidney disease, stage 3a: Secondary | ICD-10-CM | POA: Diagnosis not present

## 2020-01-21 DIAGNOSIS — M6580 Other synovitis and tenosynovitis, unspecified site: Secondary | ICD-10-CM

## 2020-01-21 DIAGNOSIS — H6123 Impacted cerumen, bilateral: Secondary | ICD-10-CM | POA: Diagnosis not present

## 2020-01-21 DIAGNOSIS — Z1159 Encounter for screening for other viral diseases: Secondary | ICD-10-CM | POA: Diagnosis not present

## 2020-01-21 DIAGNOSIS — R7301 Impaired fasting glucose: Secondary | ICD-10-CM | POA: Diagnosis not present

## 2020-01-21 DIAGNOSIS — I10 Essential (primary) hypertension: Secondary | ICD-10-CM

## 2020-01-21 LAB — POCT GLYCOSYLATED HEMOGLOBIN (HGB A1C): Hemoglobin A1C: 6.4 % — AB (ref 4.0–5.6)

## 2020-01-21 MED ORDER — LISINOPRIL 2.5 MG PO TABS: ORAL_TABLET | ORAL | 2 refills | Status: DC

## 2020-01-21 MED ORDER — AMLODIPINE BESYLATE 5 MG PO TABS: 5.0000 mg | ORAL_TABLET | Freq: Every day | ORAL | 1 refills | Status: DC

## 2020-01-21 NOTE — Assessment & Plan Note (Signed)
He has made some dietary changes by cutting out red meat and is taking her allopurinol regularly.  Hopefully this will help control her symptoms.

## 2020-01-21 NOTE — Assessment & Plan Note (Addendum)
She says she does not intend on salt restricting.  She knows it does cause her to retain fluid.

## 2020-01-21 NOTE — Patient Instructions (Signed)
You can use Debrox drops for the ear wax

## 2020-01-21 NOTE — Progress Notes (Signed)
Acute Office Visit  Subjective:    Patient ID: [name], female    DOB: 1946/03/07, 74 y.o.   MRN: [id number]  Chief Complaint  Patient presents with  . Ear Fullness    HPI Patient is in today for bilateral cerumen impaction.  He feels like her ears are full.  Hypertension- Pt denies chest pain, SOB, dizziness, or heart palpitations.  Taking meds as directed w/o problems.  Denies medication side effects.    Impaired fasting glucose-no increased thirst or urination. No symptoms consistent with hypoglycemia.  Her gout has been doing okay..  She has cut out red meat.  I asked if she thought it had been helpful or not and she is not sure.  But she has been trying to stick to it diligently.  She said she was not able to to afford the colchicine so she has not been taking that, but she has been taking her allopurinol regularly.  He also complains of feeling like her ears are just stopped up and she cannot hear quite as well she is pretty sure that she has wax buildup again.  No ear pain or drainage or fever or chills.  Past Medical History:  Diagnosis Date  . Arthritis    "qwhere" (06/06/2017)  . Chronic bilateral low back pain   . CKD (chronic kidney disease) stage 3, GFR 30-59 ml/min 09/21/2012  . Complication of anesthesia    slow to wake up "once" (06/06/2017)  . DDD (degenerative disc disease), cervical   . Dysrhythmia   . Esophageal stricture   . GERD 10/04/2007   Qualifier: Diagnosis of  By: Linford Arnold MD, Santina Evans    . HIATAL HERNIA WITH REFLUX 10/04/2007   Qualifier: Diagnosis of  By: Linford Arnold MD, Santina Evans    . History of hiatal hernia   . History of kidney stones   . Hyperlipidemia 08/10/2010   Qualifier: Diagnosis of  By: Linford Arnold MD, Santina Evans    . Hypertension   . HYPERTENSION, BENIGN ESSENTIAL 04/27/2007   Qualifier: Diagnosis of  By: Linford Arnold MD, Santina Evans    . Impaired fasting glucose 08/10/2010   Qualifier: Diagnosis of  By: Linford Arnold MD, Santina Evans    . Lobar  pneumonia (HCC)    "never missed a day of work w/it"  . Other specified cardiac arrhythmias 01/02/2015  . 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 Laurier Nancy, [facility]   . Seborrheic dermatitis 06/12/2014    Past Surgical History:  Procedure Laterality Date  . ABDOMINAL HYSTERECTOMY    . BACK SURGERY    . BLADDER SUSPENSION    . CARPAL TUNNEL RELEASE Bilateral   . CYSTOSCOPY W/ STONE MANIPULATION    . DILATION AND CURETTAGE OF UTERUS    . ESOPHAGOGASTRODUODENOSCOPY (EGD) WITH ESOPHAGEAL DILATION  X 1  . GUM SURGERY     ramus frame implant lower gum  . INSERT / REPLACE / REMOVE PACEMAKER  08/2012  . JOINT REPLACEMENT    . LUMBAR DISC SURGERY  X 2  . TARSAL TUNNEL RELEASE Bilateral   . TONSILLECTOMY    . TOTAL HIP ARTHROPLASTY Left 06/06/2017  . TOTAL HIP ARTHROPLASTY Left 06/06/2017   Procedure: TOTAL HIP ARTHROPLASTY ANTERIOR APPROACH;  Surgeon: [name], MD;  Location: [facility] OR;  Service: Orthopedics;  Laterality: Left;  . TOTAL KNEE ARTHROPLASTY Left 2006  . TOTAL KNEE ARTHROPLASTY Right 11/30/2017   Procedure: TOTAL KNEE ARTHROPLASTY;  Surgeon: [name], MD;  Location: [facility] OR;  Service: Orthopedics;  Laterality: Right;  . TOTAL KNEE REVISION Left 2007   new hardware  due to staph infection, on chronic keflex  . TRANSTHORACIC ECHOCARDIOGRAM     08/14/12 [facility]): NL LVF, no segmental abnormality, borderline LVH, mild MR, mild MR, trace TR    Family History  Problem Relation Age of Onset  . Heart attack Mother   . Diabetes Mother 20  . Hypertension Mother   . Hypertension Father     Social History   Socioeconomic History  . Marital status: Widowed    Spouse name: Not on file  . Number of children: Not on file  . Years of education: Not on file  . Highest education level: Not on file  Occupational History  . Not on file  Tobacco Use  . Smoking status:  Never Smoker  . Smokeless tobacco: Never Used  Vaping Use  . Vaping Use: Never used  Substance and Sexual Activity  . Alcohol use: No  . Drug use: No  . Sexual activity: Not on file  Other Topics Concern  . Not on file  Social History Narrative  . Not on file   Social Determinants of Health   Financial Resource Strain:   . Difficulty of Paying Living Expenses:   Food Insecurity:   . Worried About Programme researcher, broadcasting/film/video in the Last Year:   . Barista in the Last Year:   Transportation Needs:   . Freight forwarder (Medical):   Marland Kitchen Lack of Transportation (Non-Medical):   Physical Activity:   . Days of Exercise per Week:   . Minutes of Exercise per Session:   Stress:   . Feeling of Stress :   Social Connections:   . Frequency of Communication with Friends and Family:   . Frequency of Social Gatherings with Friends and Family:   . Attends Religious Services:   . Active Member of Clubs or Organizations:   . Attends Banker Meetings:   Marland Kitchen Marital Status:   Intimate Partner Violence:   . Fear of Current or Ex-Partner:   . Emotionally Abused:   Marland Kitchen Physically Abused:   . Sexually Abused:     Outpatient Medications Prior to Visit  Medication Sig Dispense Refill  . allopurinol (ZYLOPRIM) 300 MG tablet TAKE ONE TABLET BY MOUTH ONE TIME DAILY 90 tablet 0  . colchicine 0.6 MG tablet TAKE ONE TABLET BY MOUTH ONE TIME DAILY 90 tablet 1  . fluticasone (FLONASE) 50 MCG/ACT nasal spray Place 2 sprays into both nostrils daily. 16 g 6  . pantoprazole (PROTONIX) 40 MG tablet Take by mouth.    . predniSONE (DELTASONE) 20 MG tablet Take 1 tablet (20 mg total) by mouth 2 (two) times daily with a meal. 10 tablet 0  . amLODipine (NORVASC) 5 MG tablet TAKE ONE TABLET BY MOUTH ONE TIME DAILY 90 tablet 1  . desonide (DESOWEN) 0.05 % cream APPLY TO AFFECTED AREA(S) TOPICALLY AS NEEDED, NOT TO BE USED FOR MORE THAN TWO WEEKS IN A ROW ON THE FACE 30 g 0  . lisinopril (ZESTRIL) 2.5  MG tablet TAKE ONE TO TWO TABLETS BY MOUTH ONE TIME DAILY 90 tablet 2  . hydrochlorothiazide (HYDRODIURIL) 12.5 MG tablet Take 1 tablet (12.5 mg total) by mouth daily. 90 tablet 0  . ipratropium (ATROVENT) 0.03 % nasal spray Place 2 sprays into both nostrils every 12 (twelve) hours. 30 mL 0  .  meclizine (ANTIVERT) 25 MG tablet Take 1 tablet (25 mg total) by mouth 3 (three) times daily as needed for dizziness. 30 tablet 2  . tiZANidine (ZANAFLEX) 2 MG tablet Take 1 tablet (2 mg total) by mouth every 6 (six) hours as needed. 60 tablet 0  . traZODone (DESYREL) 50 MG tablet Take 0.5-2 tablets (25-100 mg total) by mouth at bedtime as needed for sleep. 30 tablet 1   No facility-administered medications prior to visit.    Allergies  Allergen Reactions  . Morphine     Other reaction(s): Delusions (intolerance)    Review of Systems     Objective:    Physical Exam Constitutional:      Appearance: She is well-developed.  HENT:     Head: Normocephalic and atraumatic.     Right Ear: External ear normal. There is impacted cerumen.     Left Ear: External ear normal. There is impacted cerumen.     Ears:     Comments: Bilateral cerumen impaction. Cardiovascular:     Rate and Rhythm: Normal rate and regular rhythm.     Heart sounds: Normal heart sounds.  Pulmonary:     Effort: Pulmonary effort is normal.     Breath sounds: Normal breath sounds.  Skin:    General: Skin is warm and dry.  Neurological:     Mental Status: She is alert and oriented to person, place, and time.  Psychiatric:        Behavior: Behavior normal.     BP 138/82   Pulse (!) 104   Ht 5\' 5"  (1.651 m)   Wt 197 lb (89.4 kg)   SpO2 100%   BMI 32.78 kg/m  Wt Readings from Last 3 Encounters:  01/21/20 197 lb (89.4 kg)  01/15/20 177 lb (80.3 kg)  09/26/18 177 lb (80.3 kg)    Health Maintenance Due  Topic Date Due  . Hepatitis C Screening  Never done  . COVID-19 Vaccine (1) Never done  . DEXA SCAN  Never done   . PNA vac Low Risk Adult (2 of 2 - PCV13) 03/20/2013    There are no preventive care reminders to display for this patient.   Lab Results  Component Value Date   TSH 0.56 03/23/2018   Lab Results  Component Value Date   WBC 7.9 11/14/2018   HGB 13.2 11/14/2018   HCT 39.4 11/14/2018   MCV 91.6 11/14/2018   PLT 370 11/14/2018   Lab Results  Component Value Date   NA 141 11/14/2018   K 4.5 11/14/2018   CO2 26 11/14/2018   GLUCOSE 116 11/14/2018   BUN 27 (H) 11/14/2018   CREATININE 1.37 (H) 11/14/2018   BILITOT 0.4 11/14/2018   ALKPHOS 32 (L) 12/07/2017   AST 18 11/14/2018   ALT 18 11/14/2018   PROT 6.1 11/14/2018   ALBUMIN 3.6 12/07/2017   CALCIUM 9.6 11/14/2018   ANIONGAP 13 12/07/2017   Lab Results  Component Value Date   CHOL 160 11/14/2013   Lab Results  Component Value Date   HDL 45 11/14/2013   Lab Results  Component Value Date   LDLCALC 86 11/14/2013   Lab Results  Component Value Date   TRIG 144 11/14/2013   Lab Results  Component Value Date   CHOLHDL 3.6 11/14/2013   Lab Results  Component Value Date   HGBA1C 6.4 (A) 01/21/2020       Assessment & Plan:   Problem List Items Addressed This Visit  Cardiovascular and Mediastinum   Essential hypertension - Primary    She says she does not intend on salt restricting.  She knows it does cause her to retain fluid.       Relevant Medications   amLODipine (NORVASC) 5 MG tablet   lisinopril (ZESTRIL) 2.5 MG tablet   Other Relevant Orders   COMPLETE METABOLIC PANEL WITH GFR   Lipid panel     Endocrine   Impaired fasting glucose    A1c elevated at 6.4 borderline for full diagnosis of diabetes just really encourage her to continue to work on healthy diet and regular exercise.  Lab Results  Component Value Date   HGBA1C 6.4 (A) 01/21/2020         Relevant Orders   POCT glycosylated hemoglobin (Hb A1C) (Completed)     Musculoskeletal and Integument   Gout and pseudogout, right  ankle    He has made some dietary changes by cutting out red meat and is taking her allopurinol regularly.  Hopefully this will help control her symptoms.        Genitourinary   CKD (chronic kidney disease) stage 3, GFR 30-59 ml/min    Overdue to recheck renal function.  Last creatinine was a year ago.      Relevant Orders   COMPLETE METABOLIC PANEL WITH GFR   Lipid panel    Other Visit Diagnoses    Encounter for hepatitis C screening test for low risk patient       Relevant Orders   Hepatitis C Antibody   Bilateral impacted cerumen         Indication: Cerumen impaction of the ear(s) Medical necessity statement: On physical examination, cerumen impairs clinically significant portions of the external auditory canal, and tympanic membrane. Noted obstructive, copious cerumen that cannot be removed without magnification and instrumentations.  Consent: Discussed benefits and risks of procedure and verbal consent obtained Procedure: Patient was prepped for the procedure. Utilized an otoscope to assess and take note of the ear canal, the tympanic membrane, and the presence, amount, and placement of the cerumen. Gentle water irrigation and soft plastic curette was utilized to remove cerumen.  Post procedure examination: shows cerumen was completely removed. Patient tolerated procedure well. The patient is made aware that they may experience temporary vertigo, temporary hearing loss, and temporary discomfort. If these symptom last for more than 24 hours to call the clinic or proceed to the ED.    Meds ordered this encounter  Medications  . amLODipine (NORVASC) 5 MG tablet    Sig: Take 1 tablet (5 mg total) by mouth daily.    Dispense:  90 tablet    Refill:  1  . lisinopril (ZESTRIL) 2.5 MG tablet    Sig: TAKE ONE TO TWO TABLETS BY MOUTH ONE TIME DAILY    Dispense:  90 tablet    Refill:  2     Nani Gasser, MD

## 2020-01-21 NOTE — Assessment & Plan Note (Signed)
Overdue to recheck renal function.  Last creatinine was a year ago.

## 2020-01-22 ENCOUNTER — Encounter: Payer: Self-pay | Admitting: Family Medicine

## 2020-01-22 DIAGNOSIS — N1831 Chronic kidney disease, stage 3a: Secondary | ICD-10-CM | POA: Diagnosis not present

## 2020-01-22 DIAGNOSIS — Z1159 Encounter for screening for other viral diseases: Secondary | ICD-10-CM | POA: Diagnosis not present

## 2020-01-22 DIAGNOSIS — I10 Essential (primary) hypertension: Secondary | ICD-10-CM | POA: Diagnosis not present

## 2020-01-22 NOTE — Assessment & Plan Note (Signed)
A1c elevated at 6.4 borderline for full diagnosis of diabetes just really encourage her to continue to work on healthy diet and regular exercise.  Lab Results  Component Value Date   HGBA1C 6.4 (A) 01/21/2020

## 2020-01-23 LAB — COMPLETE METABOLIC PANEL WITH GFR
AG Ratio: 1.8 (calc) (ref 1.0–2.5)
ALT: 19 U/L (ref 6–29)
AST: 13 U/L (ref 10–35)
Albumin: 4 g/dL (ref 3.6–5.1)
Alkaline phosphatase (APISO): 38 U/L (ref 37–153)
BUN/Creatinine Ratio: 25 (calc) — ABNORMAL HIGH (ref 6–22)
BUN: 30 mg/dL — ABNORMAL HIGH (ref 7–25)
CO2: 29 mmol/L (ref 20–32)
Calcium: 9.5 mg/dL (ref 8.6–10.4)
Chloride: 104 mmol/L (ref 98–110)
Creat: 1.22 mg/dL — ABNORMAL HIGH (ref 0.60–0.93)
GFR, Est African American: 50 mL/min/{1.73_m2} — ABNORMAL LOW (ref >=60)
GFR, Est Non African American: 43 mL/min/{1.73_m2} — ABNORMAL LOW (ref >=60)
Globulin: 2.2 g/dL (calc) (ref 1.9–3.7)
Glucose, Bld: 96 mg/dL (ref 65–99)
Potassium: 5.2 mmol/L (ref 3.5–5.3)
Sodium: 141 mmol/L (ref 135–146)
Total Bilirubin: 0.5 mg/dL (ref 0.2–1.2)
Total Protein: 6.2 g/dL (ref 6.1–8.1)

## 2020-01-23 LAB — LIPID PANEL
Cholesterol: 191 mg/dL (ref <200)
HDL: 50 mg/dL (ref >=50)
LDL Cholesterol (Calc): 113 mg/dL (calc) — ABNORMAL HIGH
Non-HDL Cholesterol (Calc): 141 mg/dL (calc) — ABNORMAL HIGH (ref <130)
Total CHOL/HDL Ratio: 3.8 (calc) (ref <5.0)
Triglycerides: 165 mg/dL — ABNORMAL HIGH (ref <150)

## 2020-01-23 LAB — HEPATITIS C ANTIBODY
Hepatitis C Ab: NONREACTIVE
SIGNAL TO CUT-OFF: 0.01 (ref <1.00)

## 2020-02-08 ENCOUNTER — Telehealth: Payer: Self-pay

## 2020-02-08 NOTE — Telephone Encounter (Signed)
Patient called stating that she wanted to talk to [facility]. I asked if I could help or take a message, patient stated no she just wanted to talk to [place].

## 2020-02-15 ENCOUNTER — Other Ambulatory Visit: Payer: Self-pay | Admitting: Sports Medicine

## 2020-02-15 DIAGNOSIS — M119 Crystal arthropathy, unspecified: Secondary | ICD-10-CM

## 2020-02-15 DIAGNOSIS — M25471 Effusion, right ankle: Secondary | ICD-10-CM

## 2020-02-18 ENCOUNTER — Other Ambulatory Visit: Payer: Self-pay

## 2020-02-18 DIAGNOSIS — M6580 Other synovitis and tenosynovitis, unspecified site: Secondary | ICD-10-CM

## 2020-02-18 DIAGNOSIS — M25471 Effusion, right ankle: Secondary | ICD-10-CM

## 2020-02-18 DIAGNOSIS — M119 Crystal arthropathy, unspecified: Secondary | ICD-10-CM

## 2020-02-18 MED ORDER — ALLOPURINOL 300 MG PO TABS: 300.0000 mg | ORAL_TABLET | Freq: Every day | ORAL | 0 refills | Status: DC

## 2020-02-18 MED ORDER — COLCHICINE 0.6 MG PO TABS: 0.6000 mg | ORAL_TABLET | Freq: Every day | ORAL | 0 refills | Status: DC

## 2020-05-18 ENCOUNTER — Other Ambulatory Visit: Payer: Self-pay | Admitting: Family Medicine

## 2020-05-18 DIAGNOSIS — M25471 Effusion, right ankle: Secondary | ICD-10-CM

## 2020-05-18 DIAGNOSIS — M119 Crystal arthropathy, unspecified: Secondary | ICD-10-CM

## 2020-08-12 ENCOUNTER — Other Ambulatory Visit: Payer: Self-pay | Admitting: Family Medicine

## 2020-08-12 DIAGNOSIS — M25471 Effusion, right ankle: Secondary | ICD-10-CM

## 2020-08-12 DIAGNOSIS — I10 Essential (primary) hypertension: Secondary | ICD-10-CM

## 2020-08-12 DIAGNOSIS — M119 Crystal arthropathy, unspecified: Secondary | ICD-10-CM

## 2020-08-13 ENCOUNTER — Other Ambulatory Visit: Payer: Self-pay | Admitting: Family Medicine

## 2020-08-13 DIAGNOSIS — H16143 Punctate keratitis, bilateral: Secondary | ICD-10-CM | POA: Diagnosis not present

## 2020-08-13 DIAGNOSIS — H00012 Hordeolum externum right lower eyelid: Secondary | ICD-10-CM | POA: Diagnosis not present

## 2020-09-11 DIAGNOSIS — H00012 Hordeolum externum right lower eyelid: Secondary | ICD-10-CM | POA: Diagnosis not present

## 2020-09-11 DIAGNOSIS — H16143 Punctate keratitis, bilateral: Secondary | ICD-10-CM | POA: Diagnosis not present

## 2020-09-29 ENCOUNTER — Other Ambulatory Visit: Payer: Self-pay | Admitting: Family Medicine

## 2020-11-11 ENCOUNTER — Other Ambulatory Visit: Payer: Self-pay | Admitting: Family Medicine

## 2020-11-11 ENCOUNTER — Other Ambulatory Visit: Payer: Self-pay | Admitting: Sports Medicine

## 2020-11-11 DIAGNOSIS — M6580 Other synovitis and tenosynovitis, unspecified site: Secondary | ICD-10-CM

## 2020-11-11 DIAGNOSIS — M25471 Effusion, right ankle: Secondary | ICD-10-CM

## 2020-11-11 DIAGNOSIS — M119 Crystal arthropathy, unspecified: Secondary | ICD-10-CM

## 2020-11-17 ENCOUNTER — Other Ambulatory Visit: Payer: Self-pay | Admitting: Family Medicine

## 2020-11-17 DIAGNOSIS — Z950 Presence of cardiac pacemaker: Secondary | ICD-10-CM | POA: Diagnosis not present

## 2020-11-17 DIAGNOSIS — M119 Crystal arthropathy, unspecified: Secondary | ICD-10-CM

## 2020-11-17 DIAGNOSIS — I10 Essential (primary) hypertension: Secondary | ICD-10-CM | POA: Diagnosis not present

## 2020-11-17 DIAGNOSIS — R0600 Dyspnea, unspecified: Secondary | ICD-10-CM | POA: Diagnosis not present

## 2020-11-17 DIAGNOSIS — M6580 Other synovitis and tenosynovitis, unspecified site: Secondary | ICD-10-CM

## 2020-11-17 DIAGNOSIS — I495 Sick sinus syndrome: Secondary | ICD-10-CM | POA: Diagnosis not present

## 2020-11-17 DIAGNOSIS — N189 Chronic kidney disease, unspecified: Secondary | ICD-10-CM | POA: Diagnosis not present

## 2020-11-24 ENCOUNTER — Other Ambulatory Visit: Payer: Self-pay

## 2020-11-24 ENCOUNTER — Telehealth: Payer: Self-pay | Admitting: Family Medicine

## 2020-11-24 ENCOUNTER — Ambulatory Visit: Payer: Medicare PPO | Admitting: Family Medicine

## 2020-11-24 ENCOUNTER — Encounter: Payer: Self-pay | Admitting: Family Medicine

## 2020-11-24 VITALS — BP 156/82 | HR 109 | Ht 65.0 in | Wt 202.0 lb

## 2020-11-24 DIAGNOSIS — R200 Anesthesia of skin: Secondary | ICD-10-CM | POA: Diagnosis not present

## 2020-11-24 DIAGNOSIS — R252 Cramp and spasm: Secondary | ICD-10-CM | POA: Diagnosis not present

## 2020-11-24 DIAGNOSIS — M119 Crystal arthropathy, unspecified: Secondary | ICD-10-CM

## 2020-11-24 DIAGNOSIS — M6580 Other synovitis and tenosynovitis, unspecified site: Secondary | ICD-10-CM | POA: Diagnosis not present

## 2020-11-24 DIAGNOSIS — I10 Essential (primary) hypertension: Secondary | ICD-10-CM | POA: Diagnosis not present

## 2020-11-24 DIAGNOSIS — R251 Tremor, unspecified: Secondary | ICD-10-CM | POA: Diagnosis not present

## 2020-11-24 DIAGNOSIS — N1831 Chronic kidney disease, stage 3a: Secondary | ICD-10-CM

## 2020-11-24 DIAGNOSIS — I129 Hypertensive chronic kidney disease with stage 1 through stage 4 chronic kidney disease, or unspecified chronic kidney disease: Secondary | ICD-10-CM | POA: Diagnosis not present

## 2020-11-24 DIAGNOSIS — E519 Thiamine deficiency, unspecified: Secondary | ICD-10-CM | POA: Diagnosis not present

## 2020-11-24 DIAGNOSIS — R202 Paresthesia of skin: Secondary | ICD-10-CM

## 2020-11-24 DIAGNOSIS — R7301 Impaired fasting glucose: Secondary | ICD-10-CM | POA: Diagnosis not present

## 2020-11-24 DIAGNOSIS — M25471 Effusion, right ankle: Secondary | ICD-10-CM

## 2020-11-24 LAB — POCT GLYCOSYLATED HEMOGLOBIN (HGB A1C): Hemoglobin A1C: 6.2 % — AB (ref 4.0–5.6)

## 2020-11-24 MED ORDER — ALLOPURINOL 300 MG PO TABS: 300.0000 mg | ORAL_TABLET | Freq: Every day | ORAL | 1 refills | Status: DC

## 2020-11-24 MED ORDER — LISINOPRIL 10 MG PO TABS: 10.0000 mg | ORAL_TABLET | Freq: Every day | ORAL | 0 refills | Status: DC

## 2020-11-24 NOTE — Assessment & Plan Note (Signed)
Pressure not well controlled today.  We will increase lisinopril from 2.5 mg to 10 mg and have her follow-up in a couple of weeks to repeat her blood pressure.  She is in some discomfort and pain today particularly with her left ankle so certainly that could be contributing.  But her blood pressure was a little elevated last summer when she was when she was last here.

## 2020-11-24 NOTE — Telephone Encounter (Signed)
Please call patient and let her know that I did send over an increased dose of lisinopril 10 mg for her to try to better control her blood pressure I love to see her back in about 2 to 3 weeks to make sure that her blood pressure is better controlled and to discuss her tremor further as I know we did not get a chance to get into the details.  Soon as we get her blood work back we will call her with those results as well.

## 2020-11-24 NOTE — Assessment & Plan Note (Signed)
A1C of 6.2, much improved.  F/Uin 6 months.

## 2020-11-24 NOTE — Progress Notes (Signed)
Pt was seen by Dr. Chales Abrahams on 11/17/20 she will have to get a Nuclear stress test. She stated this is due to her pacemaker.

## 2020-11-24 NOTE — Assessment & Plan Note (Signed)
Aced on exam tremors most consistent with benign essential tremor.  It is more noticeable when she holds her hands out instead of at rest in her lap.  She does feel like it is gotten somewhat worse we can discuss potential treatment such as primidone at next office visit.

## 2020-11-24 NOTE — Assessment & Plan Note (Signed)
Due to recheck renal function its actually been over 6 months since the last renal check and a creatinine was 1.2 at that time.  Plan to recheck labs today.

## 2020-11-24 NOTE — Progress Notes (Signed)
Established Patient Office Visit  Subjective:  Patient ID: [name], female    DOB: 11/26/1945  Age: 75 y.o. MRN: [id number]  CC:  Chief Complaint  Patient presents with  . Hypertension    HPI [name] presents for   Hypertension- Pt denies chest pain, SOB, dizziness, or heart palpitations.  Taking meds as directed w/o problems.  Denies medication side effects.    Impaired fasting glucose-no increased thirst or urination. No symptoms consistent with hypoglycemia.  Just saw her cardiologist, Dr. Chales Abrahamsyson, about a week ago.  He has a history of sick sinus syndrome and has a pacemaker in place.  He has been having some shortness of breath with activity.  He is planning on scheduling an echocardiogram as well as a nuclear scan to rule out ischemia.  He wants evaluate her for coronary artery disease.  She also reports that she has been having a lot of pain and difficulty with her legs and feet in particular she has been getting a lot of cramps in her legs and feet.  From her buttock area down.  She says it usually worse on the left side compared to the right but it is experiencing it bilaterally.  She says she also gets numbness and tingling in both of her feet.  Her left ankle has been more painful and swollen recently as well she reports that she has been taking her colchicine but she has been trying to take it every other day because she was about to run out.  She has a tremor in both of her hands and feels like its been getting progressively worse.  It is hard to do more tiny movements or hold a PE on a fork.  Past Medical History:  Diagnosis Date  . Arthritis    "qwhere" (06/06/2017)  . Chronic bilateral low back pain   . CKD (chronic kidney disease) stage 3, GFR 30-59 ml/min (HCC) 09/21/2012  . Complication of anesthesia    slow to wake up "once" (06/06/2017)  . DDD (degenerative disc disease), cervical   . Dysrhythmia   . Esophageal stricture   . GERD 10/04/2007    Qualifier: Diagnosis of  By: Linford ArnoldMetheney MD, Santina Evansatherine    . HIATAL HERNIA WITH REFLUX 10/04/2007   Qualifier: Diagnosis of  By: Linford ArnoldMetheney MD, Santina Evansatherine    . History of hiatal hernia   . History of kidney stones   . Hyperlipidemia 08/10/2010   Qualifier: Diagnosis of  By: Linford ArnoldMetheney MD, Santina Evansatherine    . Hypertension   . HYPERTENSION, BENIGN ESSENTIAL 04/27/2007   Qualifier: Diagnosis of  By: Linford ArnoldMetheney MD, Santina Evansatherine    . Impaired fasting glucose 08/10/2010   Qualifier: Diagnosis of  By: Linford ArnoldMetheney MD, Santina Evansatherine    . Lobar pneumonia (HCC)    "never missed a day of work w/it"  . Other specified cardiac arrhythmias 01/02/2015  . 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 Laurier NancyZann Tyson, [facility]   . Seborrheic dermatitis 06/12/2014    Past Surgical History:  Procedure Laterality Date  . ABDOMINAL HYSTERECTOMY    . BACK SURGERY    . BLADDER SUSPENSION    . CARPAL TUNNEL RELEASE Bilateral   . CYSTOSCOPY W/ STONE MANIPULATION    . DILATION AND CURETTAGE OF UTERUS    . ESOPHAGOGASTRODUODENOSCOPY (EGD) WITH ESOPHAGEAL DILATION  X 1  . GUM SURGERY     ramus frame implant lower  gum  . INSERT / REPLACE / REMOVE PACEMAKER  08/2012  . JOINT REPLACEMENT    . LUMBAR DISC SURGERY  X 2  . TARSAL TUNNEL RELEASE Bilateral   . TONSILLECTOMY    . TOTAL HIP ARTHROPLASTY Left 06/06/2017  . TOTAL HIP ARTHROPLASTY Left 06/06/2017   Procedure: TOTAL HIP ARTHROPLASTY ANTERIOR APPROACH;  Surgeon: [name], MD;  Location: [facility] OR;  Service: Orthopedics;  Laterality: Left;  . TOTAL KNEE ARTHROPLASTY Left 2006  . TOTAL KNEE ARTHROPLASTY Right 11/30/2017   Procedure: TOTAL KNEE ARTHROPLASTY;  Surgeon: [name], MD;  Location: [facility] OR;  Service: Orthopedics;  Laterality: Right;  . TOTAL KNEE REVISION Left 2007   new hardware  due to staph infection, on chronic keflex  . TRANSTHORACIC ECHOCARDIOGRAM     08/14/12 [facility]):  NL LVF, no segmental abnormality, borderline LVH, mild MR, mild MR, trace TR    Family History  Problem Relation Age of Onset  . Heart attack Mother   . Diabetes Mother 28  . Hypertension Mother   . Hypertension Father     Social History   Socioeconomic History  . Marital status: Widowed    Spouse name: Not on file  . Number of children: Not on file  . Years of education: Not on file  . Highest education level: Not on file  Occupational History  . Not on file  Tobacco Use  . Smoking status: Never Smoker  . Smokeless tobacco: Never Used  Vaping Use  . Vaping Use: Never used  Substance and Sexual Activity  . Alcohol use: No  . Drug use: No  . Sexual activity: Not on file  Other Topics Concern  . Not on file  Social History Narrative  . Not on file   Social Determinants of Health   Financial Resource Strain: Not on file  Food Insecurity: Not on file  Transportation Needs: Not on file  Physical Activity: Not on file  Stress: Not on file  Social Connections: Not on file  Intimate Partner Violence: Not on file    Outpatient Medications Prior to Visit  Medication Sig Dispense Refill  . amLODipine (NORVASC) 5 MG tablet TAKE ONE TABLET BY MOUTH ONE TIME DAILY 90 tablet 1  . colchicine 0.6 MG tablet TAKE ONE TABLET BY MOUTH ONE TIME DAILY 30 tablet 0  . desonide (DESOWEN) 0.05 % cream APPLY TO AFFECTED AREA(S) TOPICALLY ONE TIME DAILY AS NEEDED, NOT TO BE USED FOR MORE THAN TWO WEEKS IN A ROW ON THE FACE 30 g 0  . fluticasone (FLONASE) 50 MCG/ACT nasal spray Place 2 sprays into both nostrils daily. 16 g 6  . pantoprazole (PROTONIX) 40 MG tablet Take by mouth.    Marland Kitchen allopurinol (ZYLOPRIM) 300 MG tablet TAKE ONE TABLET BY MOUTH ONE TIME DAILY 90 tablet 0  . lisinopril (ZESTRIL) 2.5 MG tablet TAKE ONE TO TWO TABLETS BY MOUTH ONE TIME DAILY 90 tablet 2  . predniSONE (DELTASONE) 20 MG tablet Take 1 tablet (20 mg total) by mouth 2 (two) times daily with a meal. 10 tablet 0    No facility-administered medications prior to visit.    Allergies  Allergen Reactions  . Morphine     Other reaction(s): Delusions (intolerance)    ROS Review of Systems    Objective:    Physical Exam Constitutional:      Appearance: She is well-developed.  HENT:     Head: Normocephalic and atraumatic.  Cardiovascular:     Rate  and Rhythm: Normal rate and regular rhythm.     Heart sounds: Normal heart sounds.  Pulmonary:     Effort: Pulmonary effort is normal.     Breath sounds: Normal breath sounds.  Skin:    General: Skin is warm and dry.  Neurological:     Mental Status: She is alert and oriented to person, place, and time.  Psychiatric:        Behavior: Behavior normal.     BP (!) 156/82   Pulse (!) 109   Ht 5\' 5"  (1.651 m)   Wt 202 lb (91.6 kg)   SpO2 99%   BMI 33.61 kg/m  Wt Readings from Last 3 Encounters:  11/24/20 202 lb (91.6 kg)  01/21/20 197 lb (89.4 kg)  01/15/20 177 lb (80.3 kg)     Health Maintenance Due  Topic Date Due  . DEXA SCAN  Never done    There are no preventive care reminders to display for this patient.  Lab Results  Component Value Date   TSH 0.56 03/23/2018   Lab Results  Component Value Date   WBC 8.8 11/24/2020   HGB 15.1 11/24/2020   HCT 45.3 (H) 11/24/2020   MCV 95.2 11/24/2020   PLT 336 11/24/2020   Lab Results  Component Value Date   NA 140 11/24/2020   K 4.9 11/24/2020   CO2 25 11/24/2020   GLUCOSE 104 (H) 11/24/2020   BUN 26 (H) 11/24/2020   CREATININE 1.36 (H) 11/24/2020   BILITOT 0.5 11/24/2020   ALKPHOS 32 (L) 12/07/2017   AST 23 11/24/2020   ALT 26 11/24/2020   PROT 6.8 11/24/2020   ALBUMIN 3.6 12/07/2017   CALCIUM 9.6 11/24/2020   ANIONGAP 13 12/07/2017   Lab Results  Component Value Date   CHOL 191 01/22/2020   Lab Results  Component Value Date   HDL 50 01/22/2020   Lab Results  Component Value Date   LDLCALC 113 (H) 01/22/2020   Lab Results  Component Value Date   TRIG 165  (H) 01/22/2020   Lab Results  Component Value Date   CHOLHDL 3.8 01/22/2020   Lab Results  Component Value Date   HGBA1C 6.2 (A) 11/24/2020      Assessment & Plan:   Problem List Items Addressed This Visit      Cardiovascular and Mediastinum   Essential hypertension    Pressure not well controlled today.  We will increase lisinopril from 2.5 mg to 10 mg and have her follow-up in a couple of weeks to repeat her blood pressure.  She is in some discomfort and pain today particularly with her left ankle so certainly that could be contributing.  But her blood pressure was a little elevated last summer when she was when she was last here.      Relevant Medications   lisinopril (ZESTRIL) 10 MG tablet   Other Relevant Orders   Uric acid (Completed)   COMPLETE METABOLIC PANEL WITH GFR (Completed)   CBC (Completed)   Magnesium (Completed)   B12   Vitamin B6   Vitamin B1     Endocrine   Impaired fasting glucose - Primary    A1C of 6.2, much improved.  F/Uin 6 months.        Relevant Orders   POCT glycosylated hemoglobin (Hb A1C) (Completed)   Uric acid (Completed)   COMPLETE METABOLIC PANEL WITH GFR (Completed)   CBC (Completed)   Magnesium (Completed)   B12   Vitamin B6  Vitamin B1     Musculoskeletal and Integument   Gout and pseudogout, right ankle    Continue with colchicine and allopurinol.  It looks like per our records we actually did refill her colchicine on the 19th so she just needs to contact the pharmacy to get that filled.  We will make sure that she has refills on her allopurinol as well.      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  Will adjust her  ACE inhibitor.      CKD (chronic kidney disease) stage 3, GFR 30-59 ml/min (HCC)    Due to recheck renal function its actually been over 6 months since the last renal check and a creatinine was 1.2 at that time.  Plan to recheck labs today.      Relevant Orders   Uric acid (Completed)   COMPLETE METABOLIC PANEL WITH GFR (Completed)   CBC (Completed)   Magnesium (Completed)   B12   Vitamin B6   Vitamin B1     Other   Tremor    Aced on exam tremors most consistent with benign essential tremor.  It is more noticeable when she holds her hands out instead of at rest in her lap.  She does feel like it is gotten somewhat worse we can discuss potential treatment such as primidone at next office visit.       Other Visit Diagnoses    Leg cramping       Relevant Orders   Uric acid (Completed)   COMPLETE METABOLIC PANEL WITH GFR (Completed)   CBC (Completed)   Magnesium (Completed)   B12   Vitamin B6   Vitamin B1   Numbness and tingling of both feet       Relevant Orders   Uric acid (Completed)   COMPLETE METABOLIC PANEL WITH GFR (Completed)   CBC (Completed)   Magnesium (Completed)   B12   Vitamin B6   Vitamin B1   Right ankle swelling         Leg cramps-we will work-up further to look for electrolyte abnormality, low magnesium etc.  We can consider a trial of B6 if her labs are normal.  Paresthesia of feet-unclear etiology we will check for deficiencies.  Could consider it could be coming from her back as well.  We will check for B12 deficiency.  Meds ordered this encounter  Medications  . lisinopril (ZESTRIL) 10 MG tablet    Sig: Take 1 tablet (10 mg total) by mouth daily.    Dispense:  30 tablet    Refill:  0  . allopurinol (ZYLOPRIM) 300 MG tablet    Sig: Take 1 tablet (300 mg total) by mouth daily.    Dispense:  90 tablet    Refill:  1    Follow-up: Return in about 6 months (around 05/26/2021) for BP and prediabetes.    Nani Gasser, MD

## 2020-11-24 NOTE — Assessment & Plan Note (Signed)
Continue with colchicine and allopurinol.  It looks like per our records we actually did refill her colchicine on the 19th so she just needs to contact the pharmacy to get that filled.  We will make sure that she has refills on her allopurinol as well.

## 2020-11-24 NOTE — Assessment & Plan Note (Signed)
Pressure not well controlled.  Will adjust her ACE inhibitor.

## 2020-11-25 NOTE — Telephone Encounter (Signed)
No answer or v/m 

## 2020-11-25 NOTE — Telephone Encounter (Signed)
I tried to call patient. Phone just rings.

## 2020-11-26 NOTE — Telephone Encounter (Signed)
No answer or v/m 

## 2020-11-27 ENCOUNTER — Encounter: Payer: Self-pay | Admitting: Family Medicine

## 2020-11-27 DIAGNOSIS — E519 Thiamine deficiency, unspecified: Secondary | ICD-10-CM | POA: Insufficient documentation

## 2020-11-29 LAB — COMPLETE METABOLIC PANEL WITH GFR
AG Ratio: 2 (calc) (ref 1.0–2.5)
ALT: 26 U/L (ref 6–29)
AST: 23 U/L (ref 10–35)
Albumin: 4.5 g/dL (ref 3.6–5.1)
Alkaline phosphatase (APISO): 36 U/L — ABNORMAL LOW (ref 37–153)
BUN/Creatinine Ratio: 19 (calc) (ref 6–22)
BUN: 26 mg/dL — ABNORMAL HIGH (ref 7–25)
CO2: 25 mmol/L (ref 20–32)
Calcium: 9.6 mg/dL (ref 8.6–10.4)
Chloride: 105 mmol/L (ref 98–110)
Creat: 1.36 mg/dL — ABNORMAL HIGH (ref 0.60–0.93)
GFR, Est African American: 44 mL/min/{1.73_m2} — ABNORMAL LOW (ref >=60)
GFR, Est Non African American: 38 mL/min/{1.73_m2} — ABNORMAL LOW (ref >=60)
Globulin: 2.3 g/dL (calc) (ref 1.9–3.7)
Glucose, Bld: 104 mg/dL — ABNORMAL HIGH (ref 65–99)
Potassium: 4.9 mmol/L (ref 3.5–5.3)
Sodium: 140 mmol/L (ref 135–146)
Total Bilirubin: 0.5 mg/dL (ref 0.2–1.2)
Total Protein: 6.8 g/dL (ref 6.1–8.1)

## 2020-11-29 LAB — CBC
HCT: 45.3 % — ABNORMAL HIGH (ref 35.0–45.0)
Hemoglobin: 15.1 g/dL (ref 11.7–15.5)
MCH: 31.7 pg (ref 27.0–33.0)
MCHC: 33.3 g/dL (ref 32.0–36.0)
MCV: 95.2 fL (ref 80.0–100.0)
MPV: 9.7 fL (ref 7.5–12.5)
Platelets: 336 10*3/uL (ref 140–400)
RBC: 4.76 10*6/uL (ref 3.80–5.10)
RDW: 14 % (ref 11.0–15.0)
WBC: 8.8 10*3/uL (ref 3.8–10.8)

## 2020-11-29 LAB — VITAMIN B1: Vitamin B1 (Thiamine): 21 nmol/L (ref 8–30)

## 2020-11-29 LAB — URIC ACID: Uric Acid, Serum: 5.5 mg/dL (ref 2.5–7.0)

## 2020-11-29 LAB — VITAMIN B6: Vitamin B6: 31.6 ng/mL — ABNORMAL HIGH (ref 2.1–21.7)

## 2020-11-29 LAB — MAGNESIUM: Magnesium: 2 mg/dL (ref 1.5–2.5)

## 2020-11-29 LAB — VITAMIN B12: Vitamin B-12: 289 pg/mL (ref 200–1100)

## 2020-12-03 DIAGNOSIS — Z950 Presence of cardiac pacemaker: Secondary | ICD-10-CM | POA: Diagnosis not present

## 2020-12-03 DIAGNOSIS — I495 Sick sinus syndrome: Secondary | ICD-10-CM | POA: Diagnosis not present

## 2020-12-03 DIAGNOSIS — I10 Essential (primary) hypertension: Secondary | ICD-10-CM | POA: Diagnosis not present

## 2020-12-03 DIAGNOSIS — R0600 Dyspnea, unspecified: Secondary | ICD-10-CM | POA: Diagnosis not present

## 2020-12-10 DIAGNOSIS — R0600 Dyspnea, unspecified: Secondary | ICD-10-CM | POA: Diagnosis not present

## 2020-12-10 DIAGNOSIS — Z950 Presence of cardiac pacemaker: Secondary | ICD-10-CM | POA: Diagnosis not present

## 2020-12-10 DIAGNOSIS — I495 Sick sinus syndrome: Secondary | ICD-10-CM | POA: Diagnosis not present

## 2020-12-10 DIAGNOSIS — I10 Essential (primary) hypertension: Secondary | ICD-10-CM | POA: Diagnosis not present

## 2020-12-10 DIAGNOSIS — R0609 Other forms of dyspnea: Secondary | ICD-10-CM | POA: Diagnosis not present

## 2020-12-15 MED ORDER — PANTOPRAZOLE SODIUM 40 MG PO TBEC: 40.0000 mg | DELAYED_RELEASE_TABLET | Freq: Every day | ORAL | 1 refills | Status: DC

## 2020-12-15 NOTE — Telephone Encounter (Signed)
Patient advised medication sent to pharmacy.

## 2020-12-15 NOTE — Addendum Note (Signed)
Addended by: Chalmers Cater on: 12/15/2020 01:11 PM   Modules accepted: Orders

## 2020-12-15 NOTE — Telephone Encounter (Signed)
Okay to send refills on Protonix when needed.

## 2020-12-15 NOTE — Telephone Encounter (Signed)
[name] called and states she wanted to know if Dr Linford Arnold would take over filling the protonix 40 mg once daily.    I did mention note as below. She states her Cardiologist Dr Chales Abrahams did not want to increase her medications until after all her cardiac procedures.

## 2020-12-16 ENCOUNTER — Telehealth: Payer: Self-pay

## 2020-12-16 NOTE — Telephone Encounter (Signed)
[name] called and asked about her lisinopril Rx. I called [facility] and checked to see if she picked it up, [facility] stated that she never picked it up. Rx is now being filled and I contacted [name] to let her know.

## 2020-12-23 DIAGNOSIS — I442 Atrioventricular block, complete: Secondary | ICD-10-CM | POA: Diagnosis not present

## 2020-12-23 DIAGNOSIS — I209 Angina pectoris, unspecified: Secondary | ICD-10-CM | POA: Diagnosis not present

## 2020-12-23 DIAGNOSIS — I495 Sick sinus syndrome: Secondary | ICD-10-CM | POA: Diagnosis not present

## 2020-12-23 DIAGNOSIS — I10 Essential (primary) hypertension: Secondary | ICD-10-CM | POA: Diagnosis not present

## 2020-12-23 DIAGNOSIS — R9439 Abnormal result of other cardiovascular function study: Secondary | ICD-10-CM | POA: Diagnosis not present

## 2020-12-23 DIAGNOSIS — Z950 Presence of cardiac pacemaker: Secondary | ICD-10-CM | POA: Diagnosis not present

## 2020-12-23 DIAGNOSIS — R0600 Dyspnea, unspecified: Secondary | ICD-10-CM | POA: Diagnosis not present

## 2020-12-30 DIAGNOSIS — Z79899 Other long term (current) drug therapy: Secondary | ICD-10-CM | POA: Diagnosis not present

## 2020-12-30 DIAGNOSIS — I25119 Atherosclerotic heart disease of native coronary artery with unspecified angina pectoris: Secondary | ICD-10-CM | POA: Diagnosis not present

## 2020-12-30 DIAGNOSIS — Z7982 Long term (current) use of aspirin: Secondary | ICD-10-CM | POA: Diagnosis not present

## 2020-12-30 DIAGNOSIS — I442 Atrioventricular block, complete: Secondary | ICD-10-CM | POA: Diagnosis not present

## 2020-12-30 DIAGNOSIS — I495 Sick sinus syndrome: Secondary | ICD-10-CM | POA: Diagnosis not present

## 2020-12-30 DIAGNOSIS — I2510 Atherosclerotic heart disease of native coronary artery without angina pectoris: Secondary | ICD-10-CM | POA: Diagnosis not present

## 2020-12-30 DIAGNOSIS — Z950 Presence of cardiac pacemaker: Secondary | ICD-10-CM | POA: Diagnosis not present

## 2020-12-30 DIAGNOSIS — R9439 Abnormal result of other cardiovascular function study: Secondary | ICD-10-CM | POA: Diagnosis not present

## 2020-12-30 DIAGNOSIS — I10 Essential (primary) hypertension: Secondary | ICD-10-CM | POA: Diagnosis not present

## 2021-01-07 ENCOUNTER — Other Ambulatory Visit: Payer: Self-pay

## 2021-01-07 ENCOUNTER — Other Ambulatory Visit: Payer: Self-pay | Admitting: Family Medicine

## 2021-01-07 DIAGNOSIS — M6580 Other synovitis and tenosynovitis, unspecified site: Secondary | ICD-10-CM

## 2021-01-07 DIAGNOSIS — I10 Essential (primary) hypertension: Secondary | ICD-10-CM

## 2021-01-07 MED ORDER — LISINOPRIL 10 MG PO TABS: 10.0000 mg | ORAL_TABLET | Freq: Every day | ORAL | 0 refills | Status: DC

## 2021-01-13 DIAGNOSIS — I442 Atrioventricular block, complete: Secondary | ICD-10-CM | POA: Diagnosis not present

## 2021-01-13 DIAGNOSIS — I209 Angina pectoris, unspecified: Secondary | ICD-10-CM | POA: Diagnosis not present

## 2021-01-13 DIAGNOSIS — I10 Essential (primary) hypertension: Secondary | ICD-10-CM | POA: Diagnosis not present

## 2021-01-13 DIAGNOSIS — Z950 Presence of cardiac pacemaker: Secondary | ICD-10-CM | POA: Diagnosis not present

## 2021-01-16 ENCOUNTER — Other Ambulatory Visit: Payer: Self-pay | Admitting: Family Medicine

## 2021-01-16 DIAGNOSIS — I10 Essential (primary) hypertension: Secondary | ICD-10-CM

## 2021-02-09 ENCOUNTER — Other Ambulatory Visit: Payer: Self-pay | Admitting: Family Medicine

## 2021-02-09 DIAGNOSIS — M6580 Other synovitis and tenosynovitis, unspecified site: Secondary | ICD-10-CM

## 2021-02-09 DIAGNOSIS — M119 Crystal arthropathy, unspecified: Secondary | ICD-10-CM

## 2021-02-13 ENCOUNTER — Other Ambulatory Visit: Payer: Self-pay | Admitting: Family Medicine

## 2021-02-13 DIAGNOSIS — I10 Essential (primary) hypertension: Secondary | ICD-10-CM

## 2021-02-16 DIAGNOSIS — I495 Sick sinus syndrome: Secondary | ICD-10-CM | POA: Diagnosis not present

## 2021-02-23 ENCOUNTER — Other Ambulatory Visit: Payer: Self-pay | Admitting: Family Medicine

## 2021-02-23 DIAGNOSIS — M6580 Other synovitis and tenosynovitis, unspecified site: Secondary | ICD-10-CM

## 2021-02-23 DIAGNOSIS — M119 Crystal arthropathy, unspecified: Secondary | ICD-10-CM

## 2021-03-11 ENCOUNTER — Other Ambulatory Visit: Payer: Self-pay | Admitting: Family Medicine

## 2021-03-11 DIAGNOSIS — M6580 Other synovitis and tenosynovitis, unspecified site: Secondary | ICD-10-CM

## 2021-03-11 DIAGNOSIS — M119 Crystal arthropathy, unspecified: Secondary | ICD-10-CM

## 2021-03-23 ENCOUNTER — Other Ambulatory Visit: Payer: Self-pay

## 2021-03-23 ENCOUNTER — Ambulatory Visit: Payer: Medicare PPO | Admitting: Family Medicine

## 2021-03-23 ENCOUNTER — Other Ambulatory Visit: Payer: Self-pay | Admitting: Family Medicine

## 2021-03-23 ENCOUNTER — Encounter: Payer: Self-pay | Admitting: Family Medicine

## 2021-03-23 VITALS — BP 141/51 | HR 85 | Temp 98.3°F

## 2021-03-23 DIAGNOSIS — R439 Unspecified disturbances of smell and taste: Secondary | ICD-10-CM | POA: Diagnosis not present

## 2021-03-23 DIAGNOSIS — N1831 Chronic kidney disease, stage 3a: Secondary | ICD-10-CM

## 2021-03-23 DIAGNOSIS — L299 Pruritus, unspecified: Secondary | ICD-10-CM | POA: Diagnosis not present

## 2021-03-23 DIAGNOSIS — R0602 Shortness of breath: Secondary | ICD-10-CM | POA: Diagnosis not present

## 2021-03-23 NOTE — Assessment & Plan Note (Signed)
Due to recheck renal function. 

## 2021-03-23 NOTE — Progress Notes (Signed)
Established Patient Office Visit  Subjective:  Patient ID: [name], female    DOB: 07/03/1946  Age: 75 y.o. MRN: [id number]  CC:  Chief Complaint  Patient presents with   Shortness of Breath    With exertion    Diarrhea   Change in taste    Tastes metallic     HPI [name] presents for SOB with exertion.  She says the shortness of breath has been going on for some time probably a year.  Before she was getting shortness of breath with chest pain.  Since she had the heart catheterization in May and a stent placed she says the chest pain has actually completely resolved but she still feels extremely short of breath with activity.  She was placed on Brilinta and says ever since then she just has not felt well she has had significant bruising she has had itching all over her skin.  Is getting hot makes the itching worse.  She has tried several over-the-counter things including cortisone creams, oral Benadryl.  She says the oral Benadryl does help some.    She says that things do not taste normal she says her taste buds are "messed up" she says it takes like she is looking the floor.  She wonders if her iron could be low again.  Past Medical History:  Diagnosis Date   Arthritis    "qwhere" (06/06/2017)   Chronic bilateral low back pain    CKD (chronic kidney disease) stage 3, GFR 30-59 ml/min (HCC) 09/21/2012   Complication of anesthesia    slow to wake up "once" (06/06/2017)   DDD (degenerative disc disease), cervical    Dysrhythmia    Esophageal stricture    GERD 10/04/2007   Qualifier: Diagnosis of  By: Linford ArnoldMetheney MD, Amica Harron     HIATAL HERNIA WITH REFLUX 10/04/2007   Qualifier: Diagnosis of  By: Linford ArnoldMetheney MD, Ramell Wacha     History of hiatal hernia    History of kidney stones    Hyperlipidemia 08/10/2010   Qualifier: Diagnosis of  By: Linford ArnoldMetheney MD, Miriam Liles     Hypertension    HYPERTENSION, BENIGN ESSENTIAL 04/27/2007   Qualifier: Diagnosis of  By: Linford ArnoldMetheney MD, Santina Evansatherine      Impaired fasting glucose 08/10/2010   Qualifier: Diagnosis of  By: Linford ArnoldMetheney MD, Dona Walby     Lobar pneumonia [facility](HCC)    "never missed a day of work w/it"   Other specified cardiac arrhythmias 01/02/2015   Presence of permanent cardiac pacemaker    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 Laurier NancyZann Tyson, [facility]    Seborrheic dermatitis 06/12/2014    Past Surgical History:  Procedure Laterality Date   ABDOMINAL HYSTERECTOMY     BACK SURGERY     BLADDER SUSPENSION     CARPAL TUNNEL RELEASE Bilateral    CYSTOSCOPY W/ STONE MANIPULATION     DILATION AND CURETTAGE OF UTERUS     ESOPHAGOGASTRODUODENOSCOPY (EGD) WITH ESOPHAGEAL DILATION  X 1   GUM SURGERY     ramus frame implant lower gum   INSERT / REPLACE / REMOVE PACEMAKER  08/2012   JOINT REPLACEMENT     LUMBAR DISC SURGERY  X 2   TARSAL TUNNEL RELEASE Bilateral    TONSILLECTOMY     TOTAL HIP ARTHROPLASTY Left 06/06/2017   TOTAL HIP ARTHROPLASTY Left 06/06/2017   Procedure: TOTAL HIP ARTHROPLASTY ANTERIOR APPROACH;  Surgeon: Gean Birchwoodowan, Frank, MD;  Location: [facility] OR;  Service: Orthopedics;  Laterality: Left;   TOTAL KNEE ARTHROPLASTY Left 2006   TOTAL KNEE ARTHROPLASTY Right 11/30/2017   Procedure: TOTAL KNEE ARTHROPLASTY;  Surgeon: [name], MD;  Location: [facility] OR;  Service: Orthopedics;  Laterality: Right;   TOTAL KNEE REVISION Left 2007   new hardware  due to staph infection, on chronic keflex   TRANSTHORACIC ECHOCARDIOGRAM     08/14/12 [facility]): NL LVF, no segmental abnormality, borderline LVH, mild MR, mild MR, trace TR    Family History  Problem Relation Age of Onset   Heart attack Mother    Diabetes Mother 66   Hypertension Mother    Hypertension Father     Social History   Socioeconomic History   Marital status: Widowed    Spouse name: Not on file   Number of children: Not on file   Years of education: Not on file   Highest education level: Not on file   Occupational History   Not on file  Tobacco Use   Smoking status: Never   Smokeless tobacco: Never  Vaping Use   Vaping Use: Never used  Substance and Sexual Activity   Alcohol use: No   Drug use: No   Sexual activity: Not on file  Other Topics Concern   Not on file  Social History Narrative   Not on file   Social Determinants of Health   Financial Resource Strain: Not on file  Food Insecurity: Not on file  Transportation Needs: Not on file  Physical Activity: Not on file  Stress: Not on file  Social Connections: Not on file  Intimate Partner Violence: Not on file    Outpatient Medications Prior to Visit  Medication Sig Dispense Refill   allopurinol (ZYLOPRIM) 300 MG tablet TAKE ONE TABLET BY MOUTH ONE TIME DAILY 90 tablet 0   amLODipine (NORVASC) 5 MG tablet TAKE ONE TABLET BY MOUTH ONE TIME DAILY 90 tablet 1   colchicine 0.6 MG tablet TAKE ONE TABLET BY MOUTH ONE TIME DAILY 30 tablet 3   desonide (DESOWEN) 0.05 % cream APPLY TO AFFECTED AREA(S) TOPICALLY ONE TIME DAILY AS NEEDED, NOT TO BE USED FOR MORE THAN TWO WEEKS IN A ROW ON THE FACE 30 g 0   lisinopril (ZESTRIL) 10 MG tablet TAKE ONE TABLET BY MOUTH ONE TIME DAILY 60 tablet 0   pantoprazole (PROTONIX) 40 MG tablet Take 1 tablet (40 mg total) by mouth daily. 90 tablet 1   ticagrelor (BRILINTA) 90 MG TABS tablet Take 1 tablet by mouth 2 (two) times daily.     fluticasone (FLONASE) 50 MCG/ACT nasal spray Place 2 sprays into both nostrils daily. 16 g 6   No facility-administered medications prior to visit.    Allergies  Allergen Reactions   Morphine     Delusions    ROS Review of Systems    Objective:    Physical Exam Constitutional:      Appearance: Normal appearance. She is well-developed.  HENT:     Head: Normocephalic and atraumatic.  Cardiovascular:     Rate and Rhythm: Normal rate and regular rhythm.     Heart sounds: Normal heart sounds.  Pulmonary:     Effort: Pulmonary effort is normal.      Breath sounds: Normal breath sounds.  Skin:    General: Skin is warm and dry.  Neurological:     Mental Status: She is alert and oriented to person, place, and time.  Psychiatric:  Behavior: Behavior normal.    BP (!) 141/51   Pulse 85   Temp 98.3 F (36.8 C)   SpO2 98%  Wt Readings from Last 3 Encounters:  11/24/20 202 lb (91.6 kg)  01/21/20 197 lb (89.4 kg)  01/15/20 177 lb (80.3 kg)     Health Maintenance Due  Topic Date Due   INFLUENZA VACCINE  03/02/2021    There are no preventive care reminders to display for this patient.  Lab Results  Component Value Date   TSH 0.56 03/23/2018   Lab Results  Component Value Date   WBC 8.8 11/24/2020   HGB 15.1 11/24/2020   HCT 45.3 (H) 11/24/2020   MCV 95.2 11/24/2020   PLT 336 11/24/2020   Lab Results  Component Value Date   NA 140 11/24/2020   K 4.9 11/24/2020   CO2 25 11/24/2020   GLUCOSE 104 (H) 11/24/2020   BUN 26 (H) 11/24/2020   CREATININE 1.36 (H) 11/24/2020   BILITOT 0.5 11/24/2020   ALKPHOS 32 (L) 12/07/2017   AST 23 11/24/2020   ALT 26 11/24/2020   PROT 6.8 11/24/2020   ALBUMIN 3.6 12/07/2017   CALCIUM 9.6 11/24/2020   ANIONGAP 13 12/07/2017   Lab Results  Component Value Date   CHOL 191 01/22/2020   Lab Results  Component Value Date   HDL 50 01/22/2020   Lab Results  Component Value Date   LDLCALC 113 (H) 01/22/2020   Lab Results  Component Value Date   TRIG 165 (H) 01/22/2020   Lab Results  Component Value Date   CHOLHDL 3.8 01/22/2020   Lab Results  Component Value Date   HGBA1C 6.2 (A) 11/24/2020      Assessment & Plan:   Problem List Items Addressed This Visit       Genitourinary   CKD (chronic kidney disease) stage 3, GFR 30-59 ml/min (HCC)    Due to recheck renal function.      Relevant Orders   DG Chest 2 View   COMPLETE METABOLIC PANEL WITH GFR   TSH   VITAMIN D 25 Hydroxy (Vit-D Deficiency, Fractures)   Fe+TIBC+Fer   INR/PT   CBC with  Differential/Platelet   Other Visit Diagnoses     SOB (shortness of breath)    -  Primary   Relevant Orders   DG Chest 2 View   COMPLETE METABOLIC PANEL WITH GFR   TSH   VITAMIN D 25 Hydroxy (Vit-D Deficiency, Fractures)   Fe+TIBC+Fer   INR/PT   CBC with Differential/Platelet   Pruritus       Relevant Orders   DG Chest 2 View   COMPLETE METABOLIC PANEL WITH GFR   TSH   VITAMIN D 25 Hydroxy (Vit-D Deficiency, Fractures)   Fe+TIBC+Fer   INR/PT   CBC with Differential/Platelet   Disordered taste       Relevant Orders   DG Chest 2 View   COMPLETE METABOLIC PANEL WITH GFR   TSH   VITAMIN D 25 Hydroxy (Vit-D Deficiency, Fractures)   Fe+TIBC+Fer   INR/PT   CBC with Differential/Platelet       Short of breath-unclear etiology she is no longer having chest pain with the shortness of breath she did become tachycardic with activity level but did not become hypoxic with walking.  We were unable to do a full 6-minute walk because she was unable to tolerate it when she starts to feel that short of breath she starts to feel weak and lightheaded.  I do think this still could be cardiac of origin she does have a follow-up next month with cardiology I do think she might benefit from some cardiac rehab to help her improve her cardiac capacity and reduce deconditioning.  I do not think the Brilinta is contributing as this started before the Brilinta and most dyspnea with Brilinta is usually temporary. Will get chest xray today.   Diffuse itching -  I am concerned that this started after starting the Brilinta she has lots of bruises on her arms and legs where she is actually been scratching her skin we will check her liver enzymes today just to make sure that that is not contributing.  Check for elevated liver enzymes.  Also check her platelets.  Disturbance of sensation of taste-unclear etiology.  I am not familiar with this being a side effect of Brilinta so we will go ahead and check for iron  deficiency which she does have and is history of.  Unclear what may have caused that.  No orders of the defined types were placed in this encounter.   Follow-up: Return in about 3 weeks (around 04/13/2021) for SOB/Spirometry .    Nani Gasser, MD

## 2021-03-24 LAB — CBC WITH DIFFERENTIAL/PLATELET
Absolute Monocytes: 935 cells/uL (ref 200–950)
Basophils Absolute: 37 cells/uL (ref 0–200)
Basophils Relative: 0.3 %
Eosinophils Absolute: 308 cells/uL (ref 15–500)
Eosinophils Relative: 2.5 %
HCT: 39.3 % (ref 35.0–45.0)
Hemoglobin: 12.7 g/dL (ref 11.7–15.5)
Lymphs Abs: 2263 cells/uL (ref 850–3900)
MCH: 31.7 pg (ref 27.0–33.0)
MCHC: 32.3 g/dL (ref 32.0–36.0)
MCV: 98 fL (ref 80.0–100.0)
MPV: 9.5 fL (ref 7.5–12.5)
Monocytes Relative: 7.6 %
Neutro Abs: 8758 cells/uL — ABNORMAL HIGH (ref 1500–7800)
Neutrophils Relative %: 71.2 %
Platelets: 426 10*3/uL — ABNORMAL HIGH (ref 140–400)
RBC: 4.01 10*6/uL (ref 3.80–5.10)
RDW: 15.5 % — ABNORMAL HIGH (ref 11.0–15.0)
Total Lymphocyte: 18.4 %
WBC: 12.3 10*3/uL — ABNORMAL HIGH (ref 3.8–10.8)

## 2021-03-24 LAB — IRON,TIBC AND FERRITIN PANEL
%SAT: 11 % (calc) — ABNORMAL LOW (ref 16–45)
Ferritin: 350 ng/mL — ABNORMAL HIGH (ref 16–288)
Iron: 41 ug/dL — ABNORMAL LOW (ref 45–160)
TIBC: 362 mcg/dL (calc) (ref 250–450)

## 2021-03-24 LAB — TSH: TSH: 0.39 mIU/L — ABNORMAL LOW (ref 0.40–4.50)

## 2021-03-24 LAB — COMPLETE METABOLIC PANEL WITH GFR
AG Ratio: 1.6 (calc) (ref 1.0–2.5)
ALT: 59 U/L — ABNORMAL HIGH (ref 6–29)
AST: 18 U/L (ref 10–35)
Albumin: 4 g/dL (ref 3.6–5.1)
Alkaline phosphatase (APISO): 76 U/L (ref 37–153)
BUN/Creatinine Ratio: 20 (calc) (ref 6–22)
BUN: 30 mg/dL — ABNORMAL HIGH (ref 7–25)
CO2: 20 mmol/L (ref 20–32)
Calcium: 9.2 mg/dL (ref 8.6–10.4)
Chloride: 109 mmol/L (ref 98–110)
Creat: 1.5 mg/dL — ABNORMAL HIGH (ref 0.60–1.00)
Globulin: 2.5 g/dL (calc) (ref 1.9–3.7)
Glucose, Bld: 105 mg/dL — ABNORMAL HIGH (ref 65–99)
Potassium: 4.9 mmol/L (ref 3.5–5.3)
Sodium: 139 mmol/L (ref 135–146)
Total Bilirubin: 0.9 mg/dL (ref 0.2–1.2)
Total Protein: 6.5 g/dL (ref 6.1–8.1)
eGFR: 36 mL/min/{1.73_m2} — ABNORMAL LOW (ref >=60)

## 2021-03-24 LAB — PROTIME-INR
INR: 0.9
Prothrombin Time: 9.5 s (ref 9.0–11.5)

## 2021-03-24 LAB — VITAMIN D 25 HYDROXY (VIT D DEFICIENCY, FRACTURES): Vit D, 25-Hydroxy: 34 ng/mL (ref 30–100)

## 2021-03-27 NOTE — Progress Notes (Signed)
She still needs to go for her chest x-ray and get that done.  I also wanted to see if we could call cardiology and get her appointment moved up she has 1 later next month but I do want to get her in as she had also been having chest pain recently as well.

## 2021-03-31 ENCOUNTER — Ambulatory Visit (INDEPENDENT_AMBULATORY_CARE_PROVIDER_SITE_OTHER): Payer: Medicare PPO

## 2021-03-31 ENCOUNTER — Other Ambulatory Visit: Payer: Self-pay

## 2021-03-31 DIAGNOSIS — R0602 Shortness of breath: Secondary | ICD-10-CM | POA: Diagnosis not present

## 2021-03-31 DIAGNOSIS — R439 Unspecified disturbances of smell and taste: Secondary | ICD-10-CM | POA: Diagnosis not present

## 2021-03-31 DIAGNOSIS — L299 Pruritus, unspecified: Secondary | ICD-10-CM | POA: Diagnosis not present

## 2021-03-31 DIAGNOSIS — N1831 Chronic kidney disease, stage 3a: Secondary | ICD-10-CM

## 2021-03-31 DIAGNOSIS — R059 Cough, unspecified: Secondary | ICD-10-CM | POA: Diagnosis not present

## 2021-04-01 ENCOUNTER — Telehealth: Payer: Self-pay

## 2021-04-01 NOTE — Progress Notes (Signed)
Patient: Chest x-ray is okay no sign of pneumonia.

## 2021-04-01 NOTE — Telephone Encounter (Signed)
Pt informed of CXR results.  Pt expressed understanding.  [name], CMA

## 2021-04-07 ENCOUNTER — Telehealth: Payer: Self-pay | Admitting: Family Medicine

## 2021-04-07 NOTE — Telephone Encounter (Signed)
Tried calling patient to schedule Medicare Annual Wellness Visit (AWV) either virtually   No answer   awvi 08/02/09 per [facility] please schedule at anytime with health coach  This should be a 45 minute visit.

## 2021-04-09 DIAGNOSIS — Z950 Presence of cardiac pacemaker: Secondary | ICD-10-CM | POA: Diagnosis not present

## 2021-04-09 DIAGNOSIS — I10 Essential (primary) hypertension: Secondary | ICD-10-CM | POA: Diagnosis not present

## 2021-04-09 DIAGNOSIS — I442 Atrioventricular block, complete: Secondary | ICD-10-CM | POA: Diagnosis not present

## 2021-04-09 DIAGNOSIS — R2242 Localized swelling, mass and lump, left lower limb: Secondary | ICD-10-CM | POA: Diagnosis not present

## 2021-04-09 DIAGNOSIS — Z955 Presence of coronary angioplasty implant and graft: Secondary | ICD-10-CM | POA: Diagnosis not present

## 2021-04-09 DIAGNOSIS — I2510 Atherosclerotic heart disease of native coronary artery without angina pectoris: Secondary | ICD-10-CM | POA: Diagnosis not present

## 2021-04-09 DIAGNOSIS — M7989 Other specified soft tissue disorders: Secondary | ICD-10-CM | POA: Diagnosis not present

## 2021-04-14 ENCOUNTER — Encounter: Payer: Self-pay | Admitting: Family Medicine

## 2021-04-14 ENCOUNTER — Other Ambulatory Visit: Payer: Self-pay

## 2021-04-14 ENCOUNTER — Ambulatory Visit (INDEPENDENT_AMBULATORY_CARE_PROVIDER_SITE_OTHER): Payer: Medicare PPO | Admitting: Family Medicine

## 2021-04-14 ENCOUNTER — Other Ambulatory Visit: Payer: Self-pay | Admitting: Family Medicine

## 2021-04-14 VITALS — BP 136/44 | HR 72 | Ht 62.0 in | Wt 197.0 lb

## 2021-04-14 DIAGNOSIS — R0602 Shortness of breath: Secondary | ICD-10-CM | POA: Diagnosis not present

## 2021-04-14 DIAGNOSIS — I10 Essential (primary) hypertension: Secondary | ICD-10-CM

## 2021-04-14 LAB — PULMONARY FUNCTION TEST

## 2021-04-14 MED ORDER — ALBUTEROL SULFATE HFA 108 (90 BASE) MCG/ACT IN AERS
4.0000 | INHALATION_SPRAY | Freq: Once | RESPIRATORY_TRACT | Status: AC
  Administered 2021-04-14: 4 via RESPIRATORY_TRACT

## 2021-04-14 NOTE — Patient Instructions (Signed)
You have normal spirometry

## 2021-04-14 NOTE — Progress Notes (Signed)
Established Patient Office Visit  Subjective:  Patient ID: [name], female    DOB: Oct 15, 1945  Age: 75 y.o. MRN: [id number]  CC:  Chief Complaint  Patient presents with   Shortness of Breath    HPI [name] presents for SOB.   From last OV: She says the shortness of breath has been going on for probably a year.  Before she was getting shortness of breath with chest pain.  Since she had the heart catheterization in May and a stent placed she says the chest pain has actually completely resolved but she still feels extremely short of breath with activity.  she will feel her heart race with activity as well.  She says even minor activities around the house like carrying the laundry basket will cause her heart to pound and she will feel extremely short of breath and then has to sit down and rest.  So she has not been able to be as active.  He is here today for spirometry so that we could rule out any other underlying pulmonary causes of her shortness of breath.  She had a chest x-ray which was negative.    Past Medical History:  Diagnosis Date   Arthritis    "qwhere" (06/06/2017)   Chronic bilateral low back pain    CKD (chronic kidney disease) stage 3, GFR 30-59 ml/min (HCC) 7/32/2025   Complication of anesthesia    slow to wake up "once" (06/06/2017)   DDD (degenerative disc disease), cervical    Dysrhythmia    Esophageal stricture    GERD 10/04/2007   Qualifier: Diagnosis of  By: Madilyn Fireman MD, Polly Barner     HIATAL HERNIA WITH REFLUX 10/04/2007   Qualifier: Diagnosis of  By: Madilyn Fireman MD, Karren Newland     History of hiatal hernia    History of kidney stones    Hyperlipidemia 08/10/2010   Qualifier: Diagnosis of  By: Madilyn Fireman MD, Velton Roselle     Hypertension    HYPERTENSION, BENIGN ESSENTIAL 04/27/2007   Qualifier: Diagnosis of  By: Madilyn Fireman MD, Barnetta Chapel     Impaired fasting glucose 08/10/2010   Qualifier: Diagnosis of  By: Madilyn Fireman MD, Kyandre Okray     Lobar pneumonia [facility])     "never missed a day of work w/it"   Other specified cardiac arrhythmias 01/02/2015   Presence of permanent cardiac pacemaker    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 Lisa Roca, [place]    Seborrheic dermatitis 06/12/2014    Past Surgical History:  Procedure Laterality Date   ABDOMINAL HYSTERECTOMY     BACK SURGERY     BLADDER SUSPENSION     CARPAL TUNNEL RELEASE Bilateral    CYSTOSCOPY W/ STONE MANIPULATION     DILATION AND CURETTAGE OF UTERUS     ESOPHAGOGASTRODUODENOSCOPY (EGD) WITH ESOPHAGEAL DILATION  X 1   GUM SURGERY     ramus frame implant lower gum   INSERT / REPLACE / REMOVE PACEMAKER  08/2012   JOINT REPLACEMENT     LUMBAR DISC SURGERY  X 2   TARSAL TUNNEL RELEASE Bilateral    TONSILLECTOMY     TOTAL HIP ARTHROPLASTY Left 06/06/2017   TOTAL HIP ARTHROPLASTY Left 06/06/2017   Procedure: TOTAL HIP ARTHROPLASTY ANTERIOR APPROACH;  Surgeon: Frederik Pear, MD;  Location: [place];  Service: Orthopedics;  Laterality: Left;   TOTAL KNEE ARTHROPLASTY Left 2006   TOTAL KNEE ARTHROPLASTY Right 11/30/2017   Procedure: TOTAL KNEE ARTHROPLASTY;  Surgeon: Frederik Pear, MD;  Location: [place];  Service: Orthopedics;  Laterality: Right;   TOTAL KNEE REVISION Left 2007   new hardware  due to staph infection, on chronic keflex   TRANSTHORACIC ECHOCARDIOGRAM     08/14/12 [facility]): NL LVF, no segmental abnormality, borderline LVH, mild MR, mild MR, trace TR    Family History  Problem Relation Age of Onset   Heart attack Mother    Diabetes Mother 11   Hypertension Mother    Hypertension Father     Social History   Socioeconomic History   Marital status: Widowed    Spouse name: Not on file   Number of children: Not on file   Years of education: Not on file   Highest education level: Not on file  Occupational History   Not on file  Tobacco Use   Smoking status: Never   Smokeless tobacco: Never  Vaping Use    Vaping Use: Never used  Substance and Sexual Activity   Alcohol use: No   Drug use: No   Sexual activity: Not on file  Other Topics Concern   Not on file  Social History Narrative   Not on file   Social Determinants of Health   Financial Resource Strain: Not on file  Food Insecurity: Not on file  Transportation Needs: Not on file  Physical Activity: Not on file  Stress: Not on file  Social Connections: Not on file  Intimate Partner Violence: Not on file    Outpatient Medications Prior to Visit  Medication Sig Dispense Refill   allopurinol (ZYLOPRIM) 300 MG tablet TAKE ONE TABLET BY MOUTH ONE TIME DAILY 90 tablet 0   amLODipine (NORVASC) 5 MG tablet TAKE ONE TABLET BY MOUTH ONE TIME DAILY 90 tablet 1   colchicine 0.6 MG tablet TAKE ONE TABLET BY MOUTH ONE TIME DAILY 30 tablet 3   desonide (DESOWEN) 0.05 % cream APPLY TO AFFECTED AREA(S) TOPICALLY ONE TIME DAILY AS NEEDED, NOT TO BE USED FOR MORE THAN TWO WEEKS IN A ROW ON THE FACE 30 g 0   lisinopril (ZESTRIL) 10 MG tablet TAKE ONE TABLET BY MOUTH ONE TIME DAILY 60 tablet 0   pantoprazole (PROTONIX) 40 MG tablet Take 1 tablet (40 mg total) by mouth daily. 90 tablet 1   ticagrelor (BRILINTA) 90 MG TABS tablet Take 1 tablet by mouth 2 (two) times daily.     No facility-administered medications prior to visit.    Allergies  Allergen Reactions   Morphine     Delusions    ROS Review of Systems    Objective:    Physical Exam Vitals reviewed.  Constitutional:      Appearance: She is well-developed.  HENT:     Head: Normocephalic and atraumatic.  Eyes:     Conjunctiva/sclera: Conjunctivae normal.  Cardiovascular:     Rate and Rhythm: Normal rate.  Pulmonary:     Effort: Pulmonary effort is normal.  Skin:    General: Skin is dry.     Coloration: Skin is not pale.  Neurological:     Mental Status: She is alert and oriented to person, place, and time.  Psychiatric:        Behavior: Behavior normal.    BP (!)  136/44   Pulse 72   Ht '5\' 2"'  (1.575 m)   Wt 197 lb (89.4 kg)   SpO2 99%   BMI 36.03 kg/m  Wt Readings from Last 3 Encounters:  04/14/21 197 lb (  89.4 kg)  11/24/20 202 lb (91.6 kg)  01/21/20 197 lb (89.4 kg)     Health Maintenance Due  Topic Date Due   INFLUENZA VACCINE  03/02/2021    There are no preventive care reminders to display for this patient.  Lab Results  Component Value Date   TSH 0.39 (L) 03/23/2021   Lab Results  Component Value Date   WBC 12.3 (H) 03/23/2021   HGB 12.7 03/23/2021   HCT 39.3 03/23/2021   MCV 98.0 03/23/2021   PLT 426 (H) 03/23/2021   Lab Results  Component Value Date   NA 139 03/23/2021   K 4.9 03/23/2021   CO2 20 03/23/2021   GLUCOSE 105 (H) 03/23/2021   BUN 30 (H) 03/23/2021   CREATININE 1.50 (H) 03/23/2021   BILITOT 0.9 03/23/2021   ALKPHOS 32 (L) 12/07/2017   AST 18 03/23/2021   ALT 59 (H) 03/23/2021   PROT 6.5 03/23/2021   ALBUMIN 3.6 12/07/2017   CALCIUM 9.2 03/23/2021   ANIONGAP 13 12/07/2017   EGFR 36 (L) 03/23/2021   Lab Results  Component Value Date   CHOL 191 01/22/2020   Lab Results  Component Value Date   HDL 50 01/22/2020   Lab Results  Component Value Date   LDLCALC 113 (H) 01/22/2020   Lab Results  Component Value Date   TRIG 165 (H) 01/22/2020   Lab Results  Component Value Date   CHOLHDL 3.8 01/22/2020   Lab Results  Component Value Date   HGBA1C 6.2 (A) 11/24/2020      Assessment & Plan:   Problem List Items Addressed This Visit   None Visit Diagnoses     SOB (shortness of breath)    -  Primary   Relevant Medications   albuterol (VENTOLIN HFA) 108 (90 Base) MCG/ACT inhaler 4 puff (Completed)   Other Relevant Orders   PR EVAL OF BRONCHOSPASM       Shortness of breath-spirometry today shows FVC of 84%, FEV1 of 83% with a ratio of 73.  No significant improvement in FEV1 after albuterol.  Though interestingly she did have a 26% improvement in her FEF 25-75 after albuterol.  She has  a long history of exposure to secondary smoke from her husband.  Overall spirometry is normal though on the low end of normal.  We did discuss that this can contribute to intermittent shortness of breath but I think what she is currently experiencing is more cardiac related.  She says she is working with her cardiologist and they are actually discussing making some adjustments to her pacemaker and maybe even adding an extra lead for the right side of her heart.  I do think she would in benefit from cardiac rehab as well so that she can be safely monitored while exercising to improve her stamina and endurance and her shortness of breath.  I do think that her obesity is contributing to the shortness of breath as well.  Will forward to Dr. Loni Beckwith.    Meds ordered this encounter  Medications   albuterol (VENTOLIN HFA) 108 (90 Base) MCG/ACT inhaler 4 puff    Follow-up: No follow-ups on file.    Beatrice Lecher, MD

## 2021-05-18 DIAGNOSIS — I2510 Atherosclerotic heart disease of native coronary artery without angina pectoris: Secondary | ICD-10-CM | POA: Diagnosis not present

## 2021-05-18 DIAGNOSIS — I495 Sick sinus syndrome: Secondary | ICD-10-CM | POA: Diagnosis not present

## 2021-05-18 DIAGNOSIS — R2242 Localized swelling, mass and lump, left lower limb: Secondary | ICD-10-CM | POA: Diagnosis not present

## 2021-05-18 DIAGNOSIS — I442 Atrioventricular block, complete: Secondary | ICD-10-CM | POA: Diagnosis not present

## 2021-05-18 DIAGNOSIS — M7989 Other specified soft tissue disorders: Secondary | ICD-10-CM | POA: Diagnosis not present

## 2021-05-18 DIAGNOSIS — Z950 Presence of cardiac pacemaker: Secondary | ICD-10-CM | POA: Diagnosis not present

## 2021-05-18 DIAGNOSIS — I209 Angina pectoris, unspecified: Secondary | ICD-10-CM | POA: Diagnosis not present

## 2021-05-18 DIAGNOSIS — Z955 Presence of coronary angioplasty implant and graft: Secondary | ICD-10-CM | POA: Diagnosis not present

## 2021-05-18 DIAGNOSIS — I10 Essential (primary) hypertension: Secondary | ICD-10-CM | POA: Diagnosis not present

## 2021-05-26 ENCOUNTER — Ambulatory Visit: Payer: Medicare PPO | Admitting: Family Medicine

## 2021-06-18 ENCOUNTER — Other Ambulatory Visit: Payer: Self-pay | Admitting: Family Medicine

## 2021-06-22 DIAGNOSIS — I2510 Atherosclerotic heart disease of native coronary artery without angina pectoris: Secondary | ICD-10-CM | POA: Diagnosis not present

## 2021-06-22 DIAGNOSIS — I495 Sick sinus syndrome: Secondary | ICD-10-CM | POA: Diagnosis not present

## 2021-06-22 DIAGNOSIS — Z955 Presence of coronary angioplasty implant and graft: Secondary | ICD-10-CM | POA: Diagnosis not present

## 2021-06-22 DIAGNOSIS — Z950 Presence of cardiac pacemaker: Secondary | ICD-10-CM | POA: Diagnosis not present

## 2021-06-22 DIAGNOSIS — I10 Essential (primary) hypertension: Secondary | ICD-10-CM | POA: Diagnosis not present

## 2021-06-22 DIAGNOSIS — I209 Angina pectoris, unspecified: Secondary | ICD-10-CM | POA: Diagnosis not present

## 2021-07-02 ENCOUNTER — Telehealth (INDEPENDENT_AMBULATORY_CARE_PROVIDER_SITE_OTHER): Payer: Medicare PPO | Admitting: Family Medicine

## 2021-07-02 ENCOUNTER — Encounter: Payer: Self-pay | Admitting: Family Medicine

## 2021-07-02 DIAGNOSIS — R051 Acute cough: Secondary | ICD-10-CM

## 2021-07-02 DIAGNOSIS — J209 Acute bronchitis, unspecified: Secondary | ICD-10-CM | POA: Diagnosis not present

## 2021-07-02 MED ORDER — PREDNISONE 20 MG PO TABS: 20.0000 mg | ORAL_TABLET | Freq: Two times a day (BID) | ORAL | 0 refills | Status: DC

## 2021-07-02 NOTE — Progress Notes (Signed)
Pt reports 3 days  Using Flonase nasal spray, white, thick mucus. She also has a cough. She hasn't been tested for COVID no sick contacts.    No sinus pressure

## 2021-07-02 NOTE — Progress Notes (Signed)
Virtual Visit via Telephone Note  I connected with [name] on 07/02/21 at  4:00 PM EST by telephone and verified that I am speaking with the correct person using two identifiers.   I discussed the limitations, risks, security and privacy concerns of performing an evaluation and management service by telephone and the availability of in person appointments. I also discussed with the patient that there may be a patient responsible charge related to this service. The patient expressed understanding and agreed to proceed.  Patient location: at home  Provider loccation: In office   Subjective:    CC:  No chief complaint on file.   HPI: Pt reports 3 days cough and congestion.    Using Flonase nasal spray x2 days, white thick mucus from her chest. She also has a cough. She hasn't been tested for COVID no sick contacts. Mild ST. No sinus pressure. No fever or chills, no GI sxs. Not taking any cold medication.  No known sick contacts. Hydating well.    She did want to let me know she is off the brilinta and feels so much better.  She is now on Entresto and much happier with that regimen.  She is also on metoprolol Plavix and aspirin as well.  He follows with Dr. Chales Abrahams.  Past medical history, Surgical history, Family history not pertinant except as noted below, Social history, Allergies, and medications have been entered into the medical record, reviewed, and corrections made.   Review of Systems: No fevers, chills, night sweats, weight loss, chest pain, or shortness of breath.   Objective:    General: Speaking clearly in complete sentences without any shortness of breath.  Alert and oriented x3.  Normal judgment. No apparent acute distress.    Impression and Recommendations:    Problem List Items Addressed This Visit   None Visit Diagnoses     Acute cough    -  Primary   Relevant Medications   predniSONE (DELTASONE) 20 MG tablet   Acute bronchitis, unspecified organism            Acute bronchitis-discussed that it is most likely viral.  Consider also possible flu RSV and COVID which were all going around she has not tested for any of these.  She mostly wanted to know what she could take safely over-the-counter so we discussed using Mucinex and staying hydrated and using her humidifier.  I did send over some prednisone in case over the weekend she feels like she is noticing more wheezing or shortness of breath.  If she is not better after the weekend or suddenly gets worse then please let us know and can treat with an antibiotic if needed at that point.  Meds ordered this encounter  Medications   predniSONE (DELTASONE) 20 MG tablet    Sig: Take 1 tablet (20 mg total) by mouth 2 (two) times daily with a meal.    Dispense:  10 tablet    Refill:  0    Patient will call if needed.    Meds ordered this encounter  Medications   predniSONE (DELTASONE) 20 MG tablet    Sig: Take 1 tablet (20 mg total) by mouth 2 (two) times daily with a meal.    Dispense:  10 tablet    Refill:  0    Patient will call if needed.   Also updated her medication list based on her recent visit with Dr. Chales Abrahams.  We did not have her Plavix aspirin and  metoprolol listed so I added those.  We also added the Entresto and removed the Brilinta.   I discussed the assessment and treatment plan with the patient. The patient was provided an opportunity to ask questions and all were answered. The patient agreed with the plan and demonstrated an understanding of the instructions.   The patient was advised to call back or seek an in-person evaluation if the symptoms worsen or if the condition fails to improve as anticipated.  I provided 15 minutes of non-face-to-face time during this encounter.   Nani Gasser, MD

## 2021-07-13 ENCOUNTER — Telehealth: Payer: Self-pay | Admitting: *Deleted

## 2021-07-13 ENCOUNTER — Other Ambulatory Visit: Payer: Self-pay | Admitting: Family Medicine

## 2021-07-13 DIAGNOSIS — M119 Crystal arthropathy, unspecified: Secondary | ICD-10-CM

## 2021-07-13 DIAGNOSIS — M6580 Other synovitis and tenosynovitis, unspecified site: Secondary | ICD-10-CM

## 2021-07-13 MED ORDER — AZITHROMYCIN 250 MG PO TABS: ORAL_TABLET | ORAL | 0 refills | Status: AC

## 2021-07-13 NOTE — Telephone Encounter (Signed)
Pt called and stated that she is still experiencing cough and nasal drainage. She said that she was told to call back so that she could get an ABX. No fevers.  Pharmacy confirmed. [facility]

## 2021-07-13 NOTE — Telephone Encounter (Signed)
Meds ordered this encounter  ?Medications  ? azithromycin (ZITHROMAX) 250 MG tablet  ?  Sig: 2 Ttabs PO on Day 1, then one a day x 4 days.  ?  Dispense:  6 tablet  ?  Refill:  0  ? ? ?

## 2021-07-14 NOTE — Telephone Encounter (Signed)
Pt informed of RX.  T. Joziah Dollins, CMA 

## 2021-08-24 DIAGNOSIS — C44329 Squamous cell carcinoma of skin of other parts of face: Secondary | ICD-10-CM | POA: Diagnosis not present

## 2021-09-08 ENCOUNTER — Other Ambulatory Visit: Payer: Self-pay | Admitting: Family Medicine

## 2021-09-08 DIAGNOSIS — M119 Crystal arthropathy, unspecified: Secondary | ICD-10-CM

## 2021-09-08 DIAGNOSIS — M6580 Other synovitis and tenosynovitis, unspecified site: Secondary | ICD-10-CM

## 2021-09-21 DIAGNOSIS — C44329 Squamous cell carcinoma of skin of other parts of face: Secondary | ICD-10-CM | POA: Diagnosis not present

## 2021-10-07 ENCOUNTER — Other Ambulatory Visit: Payer: Self-pay | Admitting: Family Medicine

## 2021-10-08 DIAGNOSIS — S0502XA Injury of conjunctiva and corneal abrasion without foreign body, left eye, initial encounter: Secondary | ICD-10-CM | POA: Diagnosis not present

## 2021-11-02 DIAGNOSIS — R609 Edema, unspecified: Secondary | ICD-10-CM | POA: Diagnosis not present

## 2021-11-02 DIAGNOSIS — Z955 Presence of coronary angioplasty implant and graft: Secondary | ICD-10-CM | POA: Diagnosis not present

## 2021-11-02 DIAGNOSIS — Z950 Presence of cardiac pacemaker: Secondary | ICD-10-CM | POA: Diagnosis not present

## 2021-11-02 DIAGNOSIS — I10 Essential (primary) hypertension: Secondary | ICD-10-CM | POA: Diagnosis not present

## 2021-11-02 DIAGNOSIS — I2510 Atherosclerotic heart disease of native coronary artery without angina pectoris: Secondary | ICD-10-CM | POA: Diagnosis not present

## 2021-11-02 DIAGNOSIS — I495 Sick sinus syndrome: Secondary | ICD-10-CM | POA: Diagnosis not present

## 2021-11-11 DIAGNOSIS — I2510 Atherosclerotic heart disease of native coronary artery without angina pectoris: Secondary | ICD-10-CM | POA: Diagnosis not present

## 2021-12-09 ENCOUNTER — Other Ambulatory Visit: Payer: Self-pay | Admitting: Family Medicine

## 2021-12-25 DIAGNOSIS — C44329 Squamous cell carcinoma of skin of other parts of face: Secondary | ICD-10-CM | POA: Diagnosis not present

## 2021-12-25 DIAGNOSIS — L209 Atopic dermatitis, unspecified: Secondary | ICD-10-CM | POA: Diagnosis not present

## 2022-01-26 DIAGNOSIS — I10 Essential (primary) hypertension: Secondary | ICD-10-CM | POA: Diagnosis not present

## 2022-01-26 DIAGNOSIS — M79671 Pain in right foot: Secondary | ICD-10-CM | POA: Diagnosis not present

## 2022-01-26 DIAGNOSIS — M79672 Pain in left foot: Secondary | ICD-10-CM | POA: Diagnosis not present

## 2022-01-26 DIAGNOSIS — G629 Polyneuropathy, unspecified: Secondary | ICD-10-CM | POA: Diagnosis not present

## 2022-02-01 DIAGNOSIS — Z955 Presence of coronary angioplasty implant and graft: Secondary | ICD-10-CM | POA: Diagnosis not present

## 2022-02-01 DIAGNOSIS — M7989 Other specified soft tissue disorders: Secondary | ICD-10-CM | POA: Diagnosis not present

## 2022-02-01 DIAGNOSIS — I10 Essential (primary) hypertension: Secondary | ICD-10-CM | POA: Diagnosis not present

## 2022-02-01 DIAGNOSIS — I495 Sick sinus syndrome: Secondary | ICD-10-CM | POA: Diagnosis not present

## 2022-02-01 DIAGNOSIS — I25119 Atherosclerotic heart disease of native coronary artery with unspecified angina pectoris: Secondary | ICD-10-CM | POA: Diagnosis not present

## 2022-02-01 DIAGNOSIS — Z950 Presence of cardiac pacemaker: Secondary | ICD-10-CM | POA: Diagnosis not present

## 2022-02-09 DIAGNOSIS — Z955 Presence of coronary angioplasty implant and graft: Secondary | ICD-10-CM | POA: Diagnosis not present

## 2022-02-09 DIAGNOSIS — I209 Angina pectoris, unspecified: Secondary | ICD-10-CM | POA: Diagnosis not present

## 2022-02-09 DIAGNOSIS — M7989 Other specified soft tissue disorders: Secondary | ICD-10-CM | POA: Diagnosis not present

## 2022-02-09 DIAGNOSIS — I10 Essential (primary) hypertension: Secondary | ICD-10-CM | POA: Diagnosis not present

## 2022-02-09 DIAGNOSIS — I2510 Atherosclerotic heart disease of native coronary artery without angina pectoris: Secondary | ICD-10-CM | POA: Diagnosis not present

## 2022-02-09 DIAGNOSIS — I495 Sick sinus syndrome: Secondary | ICD-10-CM | POA: Diagnosis not present

## 2022-02-09 DIAGNOSIS — Z950 Presence of cardiac pacemaker: Secondary | ICD-10-CM | POA: Diagnosis not present

## 2022-02-23 DIAGNOSIS — Z950 Presence of cardiac pacemaker: Secondary | ICD-10-CM | POA: Diagnosis not present

## 2022-02-23 DIAGNOSIS — R0609 Other forms of dyspnea: Secondary | ICD-10-CM | POA: Diagnosis not present

## 2022-02-23 DIAGNOSIS — I2510 Atherosclerotic heart disease of native coronary artery without angina pectoris: Secondary | ICD-10-CM | POA: Diagnosis not present

## 2022-02-23 DIAGNOSIS — I495 Sick sinus syndrome: Secondary | ICD-10-CM | POA: Diagnosis not present

## 2022-02-23 DIAGNOSIS — I25119 Atherosclerotic heart disease of native coronary artery with unspecified angina pectoris: Secondary | ICD-10-CM | POA: Diagnosis not present

## 2022-02-23 DIAGNOSIS — I10 Essential (primary) hypertension: Secondary | ICD-10-CM | POA: Diagnosis not present

## 2022-02-23 DIAGNOSIS — Z955 Presence of coronary angioplasty implant and graft: Secondary | ICD-10-CM | POA: Diagnosis not present

## 2022-02-23 DIAGNOSIS — M7989 Other specified soft tissue disorders: Secondary | ICD-10-CM | POA: Diagnosis not present

## 2022-02-24 DIAGNOSIS — M1611 Unilateral primary osteoarthritis, right hip: Secondary | ICD-10-CM | POA: Diagnosis not present

## 2022-02-24 DIAGNOSIS — I10 Essential (primary) hypertension: Secondary | ICD-10-CM | POA: Diagnosis not present

## 2022-02-24 DIAGNOSIS — M47816 Spondylosis without myelopathy or radiculopathy, lumbar region: Secondary | ICD-10-CM | POA: Diagnosis not present

## 2022-02-24 DIAGNOSIS — R52 Pain, unspecified: Secondary | ICD-10-CM | POA: Diagnosis not present

## 2022-02-26 DIAGNOSIS — R0609 Other forms of dyspnea: Secondary | ICD-10-CM | POA: Diagnosis not present

## 2022-03-05 DIAGNOSIS — I10 Essential (primary) hypertension: Secondary | ICD-10-CM | POA: Diagnosis not present

## 2022-03-05 DIAGNOSIS — R079 Chest pain, unspecified: Secondary | ICD-10-CM | POA: Diagnosis not present

## 2022-03-05 DIAGNOSIS — Z950 Presence of cardiac pacemaker: Secondary | ICD-10-CM | POA: Diagnosis not present

## 2022-03-05 DIAGNOSIS — I2510 Atherosclerotic heart disease of native coronary artery without angina pectoris: Secondary | ICD-10-CM | POA: Diagnosis not present

## 2022-03-05 DIAGNOSIS — R0609 Other forms of dyspnea: Secondary | ICD-10-CM | POA: Diagnosis not present

## 2022-03-10 DIAGNOSIS — H903 Sensorineural hearing loss, bilateral: Secondary | ICD-10-CM | POA: Diagnosis not present

## 2022-03-10 DIAGNOSIS — M89311 Hypertrophy of bone, right shoulder: Secondary | ICD-10-CM | POA: Diagnosis not present

## 2022-03-10 DIAGNOSIS — H93299 Other abnormal auditory perceptions, unspecified ear: Secondary | ICD-10-CM | POA: Diagnosis not present

## 2022-03-10 DIAGNOSIS — R0602 Shortness of breath: Secondary | ICD-10-CM | POA: Diagnosis not present

## 2022-03-15 DIAGNOSIS — M19011 Primary osteoarthritis, right shoulder: Secondary | ICD-10-CM | POA: Diagnosis not present

## 2022-03-15 DIAGNOSIS — M19012 Primary osteoarthritis, left shoulder: Secondary | ICD-10-CM | POA: Diagnosis not present

## 2022-03-22 DIAGNOSIS — R0609 Other forms of dyspnea: Secondary | ICD-10-CM | POA: Diagnosis not present

## 2022-03-22 DIAGNOSIS — Z950 Presence of cardiac pacemaker: Secondary | ICD-10-CM | POA: Diagnosis not present

## 2022-03-22 DIAGNOSIS — I25119 Atherosclerotic heart disease of native coronary artery with unspecified angina pectoris: Secondary | ICD-10-CM | POA: Diagnosis not present

## 2022-03-22 DIAGNOSIS — Z955 Presence of coronary angioplasty implant and graft: Secondary | ICD-10-CM | POA: Diagnosis not present

## 2022-03-22 DIAGNOSIS — I10 Essential (primary) hypertension: Secondary | ICD-10-CM | POA: Diagnosis not present

## 2022-03-22 DIAGNOSIS — R609 Edema, unspecified: Secondary | ICD-10-CM | POA: Diagnosis not present

## 2022-03-23 ENCOUNTER — Ambulatory Visit: Payer: Medicare PPO | Admitting: Family Medicine

## 2022-03-24 DIAGNOSIS — R0609 Other forms of dyspnea: Secondary | ICD-10-CM | POA: Diagnosis not present

## 2022-03-25 DIAGNOSIS — R0609 Other forms of dyspnea: Secondary | ICD-10-CM | POA: Diagnosis not present

## 2022-04-13 DIAGNOSIS — R0609 Other forms of dyspnea: Secondary | ICD-10-CM | POA: Diagnosis not present

## 2022-04-28 DIAGNOSIS — N1832 Chronic kidney disease, stage 3b: Secondary | ICD-10-CM | POA: Diagnosis not present

## 2022-04-28 DIAGNOSIS — R768 Other specified abnormal immunological findings in serum: Secondary | ICD-10-CM | POA: Diagnosis not present

## 2022-04-28 DIAGNOSIS — E538 Deficiency of other specified B group vitamins: Secondary | ICD-10-CM | POA: Diagnosis not present

## 2022-04-28 DIAGNOSIS — D8989 Other specified disorders involving the immune mechanism, not elsewhere classified: Secondary | ICD-10-CM | POA: Diagnosis not present

## 2022-04-28 DIAGNOSIS — I10 Essential (primary) hypertension: Secondary | ICD-10-CM | POA: Diagnosis not present

## 2022-04-28 DIAGNOSIS — E611 Iron deficiency: Secondary | ICD-10-CM | POA: Diagnosis not present

## 2022-05-04 DIAGNOSIS — I2510 Atherosclerotic heart disease of native coronary artery without angina pectoris: Secondary | ICD-10-CM | POA: Diagnosis not present

## 2022-05-04 DIAGNOSIS — R809 Proteinuria, unspecified: Secondary | ICD-10-CM | POA: Diagnosis not present

## 2022-05-04 DIAGNOSIS — Z955 Presence of coronary angioplasty implant and graft: Secondary | ICD-10-CM | POA: Diagnosis not present

## 2022-05-04 DIAGNOSIS — Z950 Presence of cardiac pacemaker: Secondary | ICD-10-CM | POA: Diagnosis not present

## 2022-05-04 DIAGNOSIS — I442 Atrioventricular block, complete: Secondary | ICD-10-CM | POA: Diagnosis not present

## 2022-05-04 DIAGNOSIS — R609 Edema, unspecified: Secondary | ICD-10-CM | POA: Diagnosis not present

## 2022-05-04 DIAGNOSIS — I10 Essential (primary) hypertension: Secondary | ICD-10-CM | POA: Diagnosis not present

## 2022-05-04 DIAGNOSIS — I495 Sick sinus syndrome: Secondary | ICD-10-CM | POA: Diagnosis not present

## 2022-05-04 DIAGNOSIS — M7989 Other specified soft tissue disorders: Secondary | ICD-10-CM | POA: Diagnosis not present

## 2022-06-16 ENCOUNTER — Other Ambulatory Visit: Payer: Self-pay | Admitting: Family Medicine

## 2022-07-07 ENCOUNTER — Ambulatory Visit (INDEPENDENT_AMBULATORY_CARE_PROVIDER_SITE_OTHER): Payer: Medicare PPO | Admitting: Family Medicine

## 2022-07-07 DIAGNOSIS — Z0000 Encounter for general adult medical examination without abnormal findings: Secondary | ICD-10-CM

## 2022-07-07 NOTE — Progress Notes (Signed)
MEDICARE ANNUAL WELLNESS VISIT  07/07/2022  Telephone Visit Disclaimer This Medicare AWV was conducted by telephone due to national recommendations for restrictions regarding the COVID-19 Pandemic (e.g. social distancing).  I verified, using two identifiers, that I am speaking with [name] or their authorized healthcare agent. I discussed the limitations, risks, security, and privacy concerns of performing an evaluation and management service by telephone and the potential availability of an in-person appointment in the future. The patient expressed understanding and agreed to proceed.  Location of Patient: Home Location of Provider (nurse):  In the office.  Subjective:    [name] is a 76 y.o. female patient of Metheney, Samantha Ehlers, MD who had a Medicare Annual Wellness Visit today via telephone. [name] is Retired and lives alone. she has 2 children. she reports that she is socially active and does interact with friends/family regularly. she is minimally physically active and enjoys reading, adult coloring, being outside and crafts.  Patient Care Team: Samantha Games, MD as PCP - Samantha Downing, MD as Consulting Physician (Orthopedic Surgery)     07/07/2022    8:11 AM 12/07/2017    6:24 PM 11/21/2017    3:03 PM 10/27/2017    9:20 AM 06/06/2017    7:28 PM 06/06/2017   10:25 AM 05/27/2017    3:36 PM  Advanced Directives  Does Patient Have a Medical Advance Directive? Yes Yes Yes No Yes Yes Yes  Type of Advance Directive Living will  Living will  Healthcare Power of [place];Living will Healthcare Power of [place];Living will Healthcare Power of [place];Living will  Does patient want to make changes to medical advance directive?     No - Patient declined No - Patient declined No - Patient declined  Copy of Healthcare Power of Attorney in Chart?     No - copy requested No - copy requested No - copy requested  Would patient like information on creating a  medical advance directive?    No - Patient declined       Hospital Utilization Over the Past 12 Months: # of hospitalizations or ER visits: 0 # of surgeries: 0  Review of Systems    Patient reports that her overall health is better compared to last year.  History obtained from chart review and the patient  Patient Reported Readings (BP, Pulse, CBG, Weight, etc) none  Pain Assessment Pain : No/denies pain     Current Medications & Allergies (verified) Allergies as of 07/07/2022       Reactions   Morphine    Delusions   Brilinta [ticagrelor] Other (See Comments)        Medication List        Accurate as of July 07, 2022  8:28 AM. If you have any questions, ask your nurse or doctor.          albuterol 108 (90 Base) MCG/ACT inhaler Commonly known as: VENTOLIN HFA Inhale into the lungs.   allopurinol 300 MG tablet Commonly known as: ZYLOPRIM TAKE ONE TABLET BY MOUTH ONE TIME DAILY   amLODipine 5 MG tablet Commonly known as: NORVASC TAKE ONE TABLET BY MOUTH ONE TIME DAILY   aspirin EC 81 MG tablet Take 81 mg by mouth daily. Swallow whole.   atorvastatin 20 MG tablet Commonly known as: LIPITOR Take 20 mg by mouth daily.   chlorthalidone 25 MG tablet Commonly known as: HYGROTON Take 25 mg by mouth daily.   clopidogrel 75 MG tablet Commonly known  as: PLAVIX Take 75 mg by mouth daily.   colchicine 0.6 MG tablet TAKE ONE TABLET BY MOUTH ONE TIME DAILY   cyanocobalamin 1000 MCG tablet Commonly known as: VITAMIN B12 Take 1,000 mcg by mouth daily.   Entresto 97-103 MG Generic drug: sacubitril-valsartan Take 1 tablet by mouth 2 (two) times daily.   Entresto 24-26 MG Generic drug: sacubitril-valsartan Take by mouth.   ferrous gluconate 324 MG tablet Commonly known as: FERGON Take by mouth.   gabapentin 300 MG capsule Commonly known as: NEURONTIN Take 1 capsule by mouth at bedtime.   metoprolol succinate 50 MG 24 hr tablet Commonly known  as: TOPROL-XL Take 50 mg by mouth daily. Take with or immediately following a meal.   pantoprazole 40 MG tablet Commonly known as: PROTONIX TAKE ONE TABLET BY MOUTH ONE TIME DAILY   predniSONE 20 MG tablet Commonly known as: DELTASONE Take 1 tablet (20 mg total) by mouth 2 (two) times daily with a meal.   thiamine 250 MG tablet Take 1 tablet by mouth daily.        History (reviewed): Past Medical History:  Diagnosis Date   Arthritis    "qwhere" (06/06/2017)   Chronic bilateral low back pain    CKD (chronic kidney disease) stage 3, GFR 30-59 ml/min (HCC) 09/21/2012   Complication of anesthesia    slow to wake up "once" (06/06/2017)   DDD (degenerative disc disease), cervical    Dysrhythmia    Esophageal stricture    GERD 10/04/2007   Qualifier: Diagnosis of  By: Samantha Arnold MD, [name]     HIATAL HERNIA WITH REFLUX 10/04/2007   Qualifier: Diagnosis of  By: Samantha Arnold MD, [name]     History of hiatal hernia    History of kidney stones    Hyperlipidemia 08/10/2010   Qualifier: Diagnosis of  By: Samantha Arnold MD, [name]     Hypertension    HYPERTENSION, BENIGN ESSENTIAL 04/27/2007   Qualifier: Diagnosis of  By: Samantha Arnold MD, [name]     Impaired fasting glucose 08/10/2010   Qualifier: Diagnosis of  By: Samantha Arnold MD, [name]     Lobar pneumonia [facility])    "never missed a day of work w/it"   Other specified cardiac arrhythmias 01/02/2015   Presence of permanent cardiac pacemaker    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 Laurier Nancy, [facility]    Seborrheic dermatitis 06/12/2014   Past Surgical History:  Procedure Laterality Date   ABDOMINAL HYSTERECTOMY     BACK SURGERY     BLADDER SUSPENSION     CARPAL TUNNEL RELEASE Bilateral    CYSTOSCOPY W/ STONE MANIPULATION     DILATION AND CURETTAGE OF UTERUS     ESOPHAGOGASTRODUODENOSCOPY (EGD) WITH ESOPHAGEAL DILATION  X 1   GUM SURGERY     ramus frame implant lower gum   INSERT /  REPLACE / REMOVE PACEMAKER  08/2012   JOINT REPLACEMENT     LUMBAR DISC SURGERY  X 2   TARSAL TUNNEL RELEASE Bilateral    TONSILLECTOMY     TOTAL HIP ARTHROPLASTY Left 06/06/2017   TOTAL HIP ARTHROPLASTY Left 06/06/2017   Procedure: TOTAL HIP ARTHROPLASTY ANTERIOR APPROACH;  Surgeon: [name], MD;  Location: [facility] OR;  Service: Orthopedics;  Laterality: Left;   TOTAL KNEE ARTHROPLASTY Left 2006   TOTAL KNEE ARTHROPLASTY Right 11/30/2017   Procedure: TOTAL KNEE ARTHROPLASTY;  Surgeon: [name], MD;  Location: [facility] OR;  Service: Orthopedics;  Laterality: Right;  TOTAL KNEE REVISION Left 2007   new hardware  due to staph infection, on chronic keflex   TRANSTHORACIC ECHOCARDIOGRAM     08/14/12 [facility]): NL LVF, no segmental abnormality, borderline LVH, mild MR, mild MR, trace TR   Family History  Problem Relation Age of Onset   Heart attack Mother    Diabetes Mother [id number]   Hypertension Mother    Hypertension Father    Social History   Socioeconomic History   Marital status: Widowed    Spouse name: Not on file   Number of children: 2   Years of education: 16   Highest education level: Bachelor's degree (e.g., BA, AB, BS)  Occupational History   Occupation: Retired  Tobacco Use   Smoking status: Never   Smokeless tobacco: Never  Vaping Use   Vaping Use: Never used  Substance and Sexual Activity   Alcohol use: No   Drug use: No   Sexual activity: Not Currently  Other Topics Concern   Not on file  Social History Narrative   Lives alone. She has two children; one has deceased. Her [place] lives in [place]. She enjoys reading, adult coloring, being outside and crafts.   Social Determinants of Health   Financial Resource Strain: Low Risk  (07/07/2022)   Overall Financial Resource Strain (CARDIA)    Difficulty of Paying Living Expenses: Not hard at all  Food Insecurity: No Food Insecurity (07/07/2022)   Hunger Vital Sign    Worried About Running Out of Food  in the Last Year: Never true    Ran Out of Food in the Last Year: Never true  Transportation Needs: No Transportation Needs (07/07/2022)   PRAPARE - Administrator, Civil ServiceTransportation    Lack of Transportation (Medical): No    Lack of Transportation (Non-Medical): No  Physical Activity: Inactive (07/07/2022)   Exercise Vital Sign    Days of Exercise per Week: 0 days    Minutes of Exercise per Session: 0 min  Stress: No Stress Concern Present (07/07/2022)   [employer] - Occupational Stress Questionnaire    Feeling of Stress : Not at all  Social Connections: Socially Isolated (07/07/2022)   Social Connection and Isolation Panel [NHANES]    Frequency of Communication with Friends and Family: More than three times a week    Frequency of Social Gatherings with Friends and Family: Three times a week    Attends Religious Services: Never    Active Member of Clubs or Organizations: No    Attends BankerClub or Organization Meetings: Never    Marital Status: Widowed    Activities of Daily Living    07/07/2022    8:19 AM  In your present state of health, do you have any difficulty performing the following activities:  Hearing? 1  Comment some hearing loss  Vision? 0  Difficulty concentrating or making decisions? 0  Walking or climbing stairs? 1  Comment can't walk for long periods of time.  Dressing or bathing? 0  Doing errands, shopping? 0  Preparing Food and eating ? N  Using the Toilet? N  In the past six months, have you accidently leaked urine? N  Do you have problems with loss of bowel control? N  Managing your Medications? N  Managing your Finances? N  Housekeeping or managing your Housekeeping? N    Patient Education/ Literacy How often do you need to have someone help you when you read instructions, pamphlets, or other written materials from your doctor or  pharmacy?: 1 - Never What is the last grade level you completed in school?: bachelor's degree  Exercise Current  Exercise Habits: The patient does not participate in regular exercise at present, Exercise limited by: orthopedic condition(s);neurologic condition(s)  Diet Patient reports consuming 3 meals a day and 2 snack(s) a day Patient reports that her primary diet is: Regular Patient reports that she does have regular access to food.   Depression Screen    07/07/2022    8:12 AM 01/21/2020    4:03 PM 03/28/2018    2:46 PM 12/13/2016    1:36 PM 09/30/2015    3:30 PM 06/12/2014   11:56 AM  PHQ 2/9 Scores  PHQ - 2 Score 1 0 2 0 0 0  PHQ- 9 Score   13   4     Fall Risk    07/07/2022    8:11 AM 01/21/2020    4:03 PM 03/28/2018    2:41 PM 12/13/2016    1:36 PM 09/30/2015    3:30 PM  Fall Risk   Falls in the past year? 1 0  No No  Number falls in past yr: 0      Injury with Fall? 0      Risk for fall due to : History of fall(s);Impaired mobility No Fall Risks Impaired balance/gait;Impaired mobility  Impaired balance/gait  Follow up Falls evaluation completed         Objective:  [name] seemed alert and oriented and she participated appropriately during our telephone visit.  Blood Pressure Weight BMI  BP Readings from Last 3 Encounters:  04/14/21 (!) 136/44  03/23/21 (!) 141/51  11/24/20 (!) 156/82   Wt Readings from Last 3 Encounters:  04/14/21 197 lb (89.4 kg)  11/24/20 202 lb (91.6 kg)  01/21/20 197 lb (89.4 kg)   BMI Readings from Last 1 Encounters:  04/14/21 36.03 kg/m    *Unable to obtain current vital signs, weight, and BMI due to telephone visit type  Hearing/Vision  Ember did not seem to have difficulty with hearing/understanding during the telephone conversation Reports that she has had a formal eye exam by an eye care professional within the past year Reports that she has had a formal hearing evaluation within the past year *Unable to fully assess hearing and vision during telephone visit type  Cognitive Function:    07/07/2022    8:21 AM  6CIT Screen  What  Year? 0 points  What month? 0 points  What time? 0 points  Count back from 20 0 points  Months in reverse 4 points  Repeat phrase 0 points  Total Score 4 points   (Normal:0-7, Significant for Dysfunction: >8)  Normal Cognitive Function Screening: Yes   Immunization & Health Maintenance Record Immunization History  Administered Date(s) Administered   Fluad Quad(high Dose 65+) 05/11/2019, 05/14/2021   Influenza Split 05/02/2012   Influenza Whole 04/27/2007, 05/03/2008, 05/04/2010   Influenza,inj,Quad PF,6+ Mos 05/23/2014, 06/04/2015   Influenza-Unspecified 05/13/2016, 05/11/2017, 05/11/2018   Pneumococcal Polysaccharide-23 08/02/2006, 03/20/2012   Td 08/03/2003   Tdap 05/23/2014    Health Maintenance  Topic Date Due   COVID-19 Vaccine (1) 07/23/2022 (Originally 06/06/1946)   Zoster Vaccines- Shingrix (1 of 2) 10/06/2022 (Originally 12/05/1994)   INFLUENZA VACCINE  10/31/2022 (Originally 03/02/2022)   Pneumonia Vaccine 59+ Years old (2 - PCV) 07/08/2023 (Originally 03/20/2013)   DEXA SCAN  07/08/2023 (Originally 12/04/2009)   Medicare Annual Wellness (AWV)  07/08/2023   DTaP/Tdap/Td (3 - Td or Tdap) 05/23/2024  Hepatitis C Screening  Completed   HPV VACCINES  Aged Out   COLONOSCOPY (Pts 45-72yrs Insurance coverage will need to be confirmed)  Discontinued       Assessment  This is a routine wellness examination for [employer].  Health Maintenance: Due or Overdue There are no preventive care reminders to display for this patient.   [name] does not need a referral for [employer] Assistance: Care Management:   no Social Work:    no Prescription Assistance:  no Nutrition/Diabetes Education:  no   Plan:  Personalized Goals  Goals Addressed               This Visit's Progress     Patient Stated (pt-stated)        Patient would like to loose 10 lbs.       Personalized Health Maintenance & Screening Recommendations  Pneumococcal vaccine  Influenza  vaccine Bone densitometry screening Shingles vaccine  Patient declined the bone density scan.  Lung Cancer Screening Recommended: no (Low Dose CT Chest recommended if Age 31-80 years, 30 pack-year currently smoking OR have quit w/in past 15 years) Hepatitis C Screening recommended: no HIV Screening recommended: no  Advanced Directives: Written information was not prepared per patient's request.  Referrals & Orders No orders of the defined types were placed in this encounter.   Follow-up Plan Follow-up with Samantha Games, MD as planned Schedule influenza vaccine, pneumonia and shingles vaccine at the pharmacy. Medicare wellness visit in one year. AVS printed and mailed to the patient.   I have personally reviewed and noted the following in the patient's chart:   Medical and social history Use of alcohol, tobacco or illicit drugs  Current medications and supplements Functional ability and status Nutritional status Physical activity Advanced directives List of other physicians Hospitalizations, surgeries, and ER visits in previous 12 months Vitals Screenings to include cognitive, depression, and falls Referrals and appointments  In addition, I have reviewed and discussed with [name] certain preventive protocols, quality metrics, and best practice recommendations. A written personalized care plan for preventive services as well as general preventive health recommendations is available and can be mailed to the patient at her request.      Modesto Charon, RN BSN  07/07/2022

## 2022-07-07 NOTE — Patient Instructions (Addendum)
MEDICARE ANNUAL WELLNESS VISIT Health Maintenance Summary and Written Plan of Care  [name] ,  Thank you for allowing me to perform your Medicare Annual Wellness Visit and for your ongoing commitment to your health.   Health Maintenance & Immunization History Health Maintenance  Topic Date Due  . COVID-19 Vaccine (1) 07/23/2022 (Originally 06/06/1946)  . Zoster Vaccines- Shingrix (1 of 2) 10/06/2022 (Originally 12/05/1994)  . INFLUENZA VACCINE  10/31/2022 (Originally 03/02/2022)  . Pneumonia Vaccine 79+ Years old (2 - PCV) 07/08/2023 (Originally 03/20/2013)  . DEXA SCAN  07/08/2023 (Originally 12/04/2009)  . Medicare Annual Wellness (AWV)  07/08/2023  . DTaP/Tdap/Td (3 - Td or Tdap) 05/23/2024  . Hepatitis C Screening  Completed  . HPV VACCINES  Aged Out  . COLONOSCOPY (Pts 45-37yrs Insurance coverage will need to be confirmed)  Discontinued   Immunization History  Administered Date(s) Administered  . Fluad Quad(high Dose 65+) 05/11/2019, 05/14/2021  . Influenza Split 05/02/2012  . Influenza Whole 04/27/2007, 05/03/2008, 05/04/2010  . Influenza,inj,Quad PF,6+ Mos 05/23/2014, 06/04/2015  . Influenza-Unspecified 05/13/2016, 05/11/2017, 05/11/2018  . Pneumococcal Polysaccharide-23 08/02/2006, 03/20/2012  . Td 08/03/2003  . Tdap 05/23/2014    These are the patient goals that we discussed:  Goals Addressed              This Visit's Progress   .  Patient Stated (pt-stated)        Patient would like to loose 10 lbs.        This is a list of Health Maintenance Items that are overdue or due now: Pneumococcal vaccine  Influenza vaccine Bone densitometry screening Shingles vaccine  Patient declined the bone density scan.    Orders/Referrals Placed Today: No orders of the defined types were placed in this encounter.  (Contact our referral department at [phone] if you have not spoken with someone about your referral appointment within the next 5 days)    Follow-up  Plan Follow-up with Agapito Games, MD as planned Schedule influenza vaccine, pneumonia and shingles vaccine at the pharmacy. Medicare wellness visit in one year. AVS printed and mailed to the patient.      Health Maintenance, Female Adopting a healthy lifestyle and getting preventive care are important in promoting health and wellness. Ask your health care provider about: The right schedule for you to have regular tests and exams. Things you can do on your own to prevent diseases and keep yourself healthy. What should I know about diet, weight, and exercise? Eat a healthy diet  Eat a diet that includes plenty of vegetables, fruits, low-fat dairy products, and lean protein. Do not eat a lot of foods that are high in solid fats, added sugars, or sodium. Maintain a healthy weight Body mass index (BMI) is used to identify weight problems. It estimates body fat based on height and weight. Your health care provider can help determine your BMI and help you achieve or maintain a healthy weight. Get regular exercise Get regular exercise. This is one of the most important things you can do for your health. Most adults should: Exercise for at least 150 minutes each week. The exercise should increase your heart rate and make you sweat (moderate-intensity exercise). Do strengthening exercises at least twice a week. This is in addition to the moderate-intensity exercise. Spend less time sitting. Even light physical activity can be beneficial. Watch cholesterol and blood lipids Have your blood tested for lipids and cholesterol at 77 years of age, then have this test every 5  years. Have your cholesterol levels checked more often if: Your lipid or cholesterol levels are high. You are older than 77 years of age. You are at high risk for heart disease. What should I know about cancer screening? Depending on your health history and family history, you may need to have cancer screening at various  ages. This may include screening for: Breast cancer. Cervical cancer. Colorectal cancer. Skin cancer. Lung cancer. What should I know about heart disease, diabetes, and high blood pressure? Blood pressure and heart disease High blood pressure causes heart disease and increases the risk of stroke. This is more likely to develop in people who have high blood pressure readings or are overweight. Have your blood pressure checked: Every 3-5 years if you are 82-73 years of age. Every year if you are 67 years old or older. Diabetes Have regular diabetes screenings. This checks your fasting blood sugar level. Have the screening done: Once every three years after age 28 if you are at a normal weight and have a low risk for diabetes. More often and at a younger age if you are overweight or have a high risk for diabetes. What should I know about preventing infection? Hepatitis B If you have a higher risk for hepatitis B, you should be screened for this virus. Talk with your health care provider to find out if you are at risk for hepatitis B infection. Hepatitis C Testing is recommended for: Everyone born from [birth year] through 1966. Anyone with known risk factors for hepatitis C. Sexually transmitted infections (STIs) Get screened for STIs, including gonorrhea and chlamydia, if: You are sexually active and are younger than 77 years of age. You are older than 77 years of age and your health care provider tells you that you are at risk for this type of infection. Your sexual activity has changed since you were last screened, and you are at increased risk for chlamydia or gonorrhea. Ask your health care provider if you are at risk. Ask your health care provider about whether you are at high risk for HIV. Your health care provider may recommend a prescription medicine to help prevent HIV infection. If you choose to take medicine to prevent HIV, you should first get tested for HIV. You should then be tested  every 3 months for as long as you are taking the medicine. Pregnancy If you are about to stop having your period (premenopausal) and you may become pregnant, seek counseling before you get pregnant. Take 400 to 800 micrograms (mcg) of folic acid every day if you become pregnant. Ask for birth control (contraception) if you want to prevent pregnancy. Osteoporosis and menopause Osteoporosis is a disease in which the bones lose minerals and strength with aging. This can result in bone fractures. If you are 69 years old or older, or if you are at risk for osteoporosis and fractures, ask your health care provider if you should: Be screened for bone loss. Take a calcium or vitamin D supplement to lower your risk of fractures. Be given hormone replacement therapy (HRT) to treat symptoms of menopause. Follow these instructions at home: Alcohol use Do not drink alcohol if: Your health care provider tells you not to drink. You are pregnant, may be pregnant, or are planning to become pregnant. If you drink alcohol: Limit how much you have to: 0-1 drink a day. Know how much alcohol is in your drink. In the [place] one drink equals one 12 oz bottle of beer (355 mL), one  5 oz glass of wine (148 mL), or one 1 oz glass of hard liquor (44 mL). Lifestyle Do not use any products that contain nicotine or tobacco. These products include cigarettes, chewing tobacco, and vaping devices, such as e-cigarettes. If you need help quitting, ask your health care provider. Do not use street drugs. Do not share needles. Ask your health care provider for help if you need support or information about quitting drugs. General instructions Schedule regular health, dental, and eye exams. Stay current with your vaccines. Tell your health care provider if: You often feel depressed. You have ever been abused or do not feel safe at home. Summary Adopting a healthy lifestyle and getting preventive care are important in promoting  health and wellness. Follow your health care provider's instructions about healthy diet, exercising, and getting tested or screened for diseases. Follow your health care provider's instructions on monitoring your cholesterol and blood pressure. This information is not intended to replace advice given to you by your health care provider. Make sure you discuss any questions you have with your health care provider. Document Revised: 12/08/2020 Document Reviewed: 12/08/2020 Elsevier Patient Education  2023 [employer].

## 2022-08-13 ENCOUNTER — Encounter: Payer: Self-pay | Admitting: Family Medicine

## 2022-08-13 ENCOUNTER — Ambulatory Visit: Payer: Medicare PPO | Admitting: Family Medicine

## 2022-08-13 VITALS — BP 137/65 | HR 72 | Ht 62.0 in | Wt 198.0 lb

## 2022-08-13 DIAGNOSIS — I10 Essential (primary) hypertension: Secondary | ICD-10-CM

## 2022-08-13 DIAGNOSIS — Z23 Encounter for immunization: Secondary | ICD-10-CM | POA: Diagnosis not present

## 2022-08-13 DIAGNOSIS — R251 Tremor, unspecified: Secondary | ICD-10-CM

## 2022-08-13 DIAGNOSIS — I129 Hypertensive chronic kidney disease with stage 1 through stage 4 chronic kidney disease, or unspecified chronic kidney disease: Secondary | ICD-10-CM

## 2022-08-13 DIAGNOSIS — G629 Polyneuropathy, unspecified: Secondary | ICD-10-CM

## 2022-08-13 DIAGNOSIS — R7301 Impaired fasting glucose: Secondary | ICD-10-CM

## 2022-08-13 DIAGNOSIS — G2581 Restless legs syndrome: Secondary | ICD-10-CM | POA: Diagnosis not present

## 2022-08-13 MED ORDER — PRIMIDONE 50 MG PO TABS: 25.0000 mg | ORAL_TABLET | Freq: Every day | ORAL | 1 refills | Status: DC

## 2022-08-13 MED ORDER — GABAPENTIN 300 MG PO CAPS: 300.0000 mg | ORAL_CAPSULE | Freq: Every day | ORAL | 1 refills | Status: DC

## 2022-08-13 NOTE — Assessment & Plan Note (Addendum)
Discussed diagnosis.  Exam is most consistent with an essential tremor it is affecting her arms hands and particularly her right leg.  Sign of a rolling tremor.  No cogwheeling on exam.  Tremor persists at rest and with movement.  Recommend a trial of primidone.  If not helpful then we could consider alternatives or even consider neurology referral for further workup.

## 2022-08-13 NOTE — Progress Notes (Signed)
Established Patient Office Visit  Subjective   Patient ID: [name], female    DOB: 1945-12-29  Age: 77 y.o. MRN: [id number]  Chief Complaint  Patient presents with   Medication Refill    Gabapentin     HPI  She would also like me to take over her gabapentin.  She was prescribed this by a specialist who diagnosed her with peripheral neuropathy.  She originally been diagnosed with gout and was taking allopurinol and colchicine but folic it really was not helping so ended up seeing another specialist who felt like she was experiencing peripheral neuropathy she was having a lot of pain in her feet and a sensation of feeling like she was walking on marbles.  Also pinpoints pain sensations in her lower legs.  They felt like there was also some component of restless leg.  She was started on gabapentin which she takes around 8 PM and says she has a noticeable improvement of her symptoms at night and she is able to get some rest.   Heart failure-tolerating her Entresto well.  She feels like it is really been helpful in stabilizing her heart.  She has not had any excess swelling recently.  She is no longer on the amlodipine.  He also wanted to discuss her tremor.  She says it is getting to the point that it is hard to eat and it is embarrassing so she has been avoiding going out to eat with friends which she would love to do now that she is retired.    ROS    Objective:     BP 137/65   Pulse 72   Ht 5\' 2"  (1.575 m)   Wt 198 lb (89.8 kg)   SpO2 95%   BMI 36.21 kg/m    Physical Exam Constitutional:      Appearance: She is well-developed.  HENT:     Head: Normocephalic and atraumatic.  Cardiovascular:     Rate and Rhythm: Normal rate and regular rhythm.     Heart sounds: Normal heart sounds.  Pulmonary:     Effort: Pulmonary effort is normal.     Breath sounds: Normal breath sounds.  Skin:    General: Skin is warm and dry.  Neurological:     Mental Status: She is alert  and oriented to person, place, and time.  Psychiatric:        Behavior: Behavior normal.      No results found for any visits on 08/13/22.    The 10-year ASCVD risk score (Arnett DK, et al., 2019) is: 28.6%    Assessment & Plan:   Problem List Items Addressed This Visit       Cardiovascular and Mediastinum   Essential hypertension    Blood pressure looks good today.  She is tolerating the Entresto well.        Endocrine   Impaired fasting glucose    Lab Results  Component Value Date   HGBA1C 6.2 (A) 11/24/2020  To recheck A1c.  She would be a great candidate for Farxiga to add to her regimen for her heart failure in addition to her impaired fasting glucose.      Relevant Orders   COMPLETE METABOLIC PANEL WITH GFR   Lipid Panel w/reflex Direct LDL   Hemoglobin A1c   Urine Microalbumin w/creat. ratio     Nervous and Auditory   Neuropathy    Peripheral neuropathy-we will continue with gabapentin at bedtime since it seems to provide  relief and allows her to rest and sleep.  We discussed that there are some other alternatives but this 1 is working well and not costly then we will stick with this 1 for now.      Relevant Medications   gabapentin (NEURONTIN) 300 MG capsule   Other Relevant Orders   Urine Microalbumin w/creat. 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  Will also check for excess protein loss.      Relevant Orders   COMPLETE METABOLIC PANEL WITH GFR   Lipid Panel w/reflex Direct LDL   Hemoglobin A1c   Urine Microalbumin w/creat. ratio     Other   Tremor    Discussed diagnosis.  Exam is most consistent with an essential tremor it is affecting her arms hands and particularly her right leg.  Sign of a rolling tremor.  No cogwheeling on exam.  Tremor persists at rest and with movement.  Recommend a trial of primidone.  If not helpful then we could consider alternatives or  even consider neurology referral for further workup.      Relevant Medications   primidone (MYSOLINE) 50 MG tablet   Other Visit Diagnoses     Need for immunization against influenza    -  Primary   Relevant Orders   Flu Vaccine QUAD High Dose(Fluad) (Completed)   RLS (restless legs syndrome)       Relevant Medications   gabapentin (NEURONTIN) 300 MG capsule   Other Relevant Orders   COMPLETE METABOLIC PANEL WITH GFR   Lipid Panel w/reflex Direct LDL   Hemoglobin A1c   Urine Microalbumin w/creat. ratio   Need for pneumococcal 20-valent conjugate vaccination       Relevant Orders   Pneumococcal conjugate vaccine 20-valent (Prevnar 20) (Completed)       Given flu vaccine and Prevnar 20 today.  Return in about 6 months (around 02/11/2023) for ifg.    Beatrice Lecher, MD

## 2022-08-13 NOTE — Assessment & Plan Note (Signed)
Lab Results  Component Value Date   HGBA1C 6.2 (A) 11/24/2020   To recheck A1c.  She would be a great candidate for Farxiga to add to her regimen for her heart failure in addition to her impaired fasting glucose.

## 2022-08-13 NOTE — Assessment & Plan Note (Signed)
Blood pressure looks good today.  She is tolerating the Entresto well.

## 2022-08-13 NOTE — Assessment & Plan Note (Signed)
Due to recheck renal function.  Will also check for excess protein loss.

## 2022-08-13 NOTE — Assessment & Plan Note (Signed)
Peripheral neuropathy-we will continue with gabapentin at bedtime since it seems to provide relief and allows her to rest and sleep.  We discussed that there are some other alternatives but this 1 is working well and not costly then we will stick with this 1 for now.

## 2022-08-14 LAB — HEMOGLOBIN A1C
Hgb A1c MFr Bld: 7 % of total Hgb — ABNORMAL HIGH (ref <5.7)
Mean Plasma Glucose: 154 mg/dL
eAG (mmol/L): 8.5 mmol/L

## 2022-08-14 LAB — COMPLETE METABOLIC PANEL WITH GFR
AG Ratio: 1.7 (calc) (ref 1.0–2.5)
ALT: 13 U/L (ref 6–29)
AST: 11 U/L (ref 10–35)
Albumin: 4.2 g/dL (ref 3.6–5.1)
Alkaline phosphatase (APISO): 39 U/L (ref 37–153)
BUN/Creatinine Ratio: 20 (calc) (ref 6–22)
BUN: 30 mg/dL — ABNORMAL HIGH (ref 7–25)
CO2: 25 mmol/L (ref 20–32)
Calcium: 9.5 mg/dL (ref 8.6–10.4)
Chloride: 107 mmol/L (ref 98–110)
Creat: 1.52 mg/dL — ABNORMAL HIGH (ref 0.60–1.00)
Globulin: 2.5 g/dL (calc) (ref 1.9–3.7)
Glucose, Bld: 106 mg/dL — ABNORMAL HIGH (ref 65–99)
Potassium: 4.8 mmol/L (ref 3.5–5.3)
Sodium: 143 mmol/L (ref 135–146)
Total Bilirubin: 0.4 mg/dL (ref 0.2–1.2)
Total Protein: 6.7 g/dL (ref 6.1–8.1)
eGFR: 35 mL/min/{1.73_m2} — ABNORMAL LOW (ref >=60)

## 2022-08-14 LAB — MICROALBUMIN / CREATININE URINE RATIO
Creatinine, Urine: 91 mg/dL (ref 20–275)
Microalb Creat Ratio: 19 mcg/mg creat (ref <30)
Microalb, Ur: 1.7 mg/dL

## 2022-08-14 LAB — LIPID PANEL W/REFLEX DIRECT LDL
Cholesterol: 186 mg/dL (ref <200)
HDL: 43 mg/dL — ABNORMAL LOW (ref >=50)
LDL Cholesterol (Calc): 113 mg/dL (calc) — ABNORMAL HIGH
Non-HDL Cholesterol (Calc): 143 mg/dL (calc) — ABNORMAL HIGH (ref <130)
Total CHOL/HDL Ratio: 4.3 (calc) (ref <5.0)
Triglycerides: 188 mg/dL — ABNORMAL HIGH (ref <150)

## 2022-08-16 ENCOUNTER — Encounter: Payer: Self-pay | Admitting: Family Medicine

## 2022-08-16 MED ORDER — BLOOD GLUCOSE MONITOR KIT: PACK | 0 refills | Status: AC

## 2022-08-16 NOTE — Addendum Note (Signed)
Addended by: Beatrice Lecher D on: 08/16/2022 07:18 AM   Modules accepted: Orders

## 2022-08-16 NOTE — Progress Notes (Signed)
Call patient: Kidney function is similar to what it was a year ago at 1.5.  He still look a little bit dry on blood work.  Make sure hydrating well.  Liver function is back down to normal which is very reassuring.  LDL cholesterol is still mildly elevated.  A1c is up compared to last time.  It was previously in the prediabetes range.  It is now 7.0 which is in the diabetes range.  I would like to send over prescription for glucometer lancets and strips to your local pharmacy.  Like for you to start testing your blood sugar in the morning before you eat breakfast 2 days/week.  I also like to get you in for diabetic nutrition classes.  If that something that you are okay with please let me know.  In addition just encouraged her to really work on cutting back sugar and carbs in your diet.  And eating more vegetables and lean proteins.  Our plan will be to recheck your A1c again in 3 months.

## 2022-09-27 ENCOUNTER — Telehealth: Payer: Self-pay | Admitting: Family Medicine

## 2022-09-27 NOTE — Telephone Encounter (Signed)
Pt called stating when she tried to pick up her gabapentin (NEURONTIN) 300 MG capsule [id number] prescription the pharmacy told her the doctor denied it. Pt was asking why it may have been denied.

## 2022-09-27 NOTE — Telephone Encounter (Signed)
This was sent in for her on 08/13/2022 for #90 with 1 refill. She is only to take 1 tablet daily at bedtime, so she should NOT be out of this medication so soon. This is a six month supply.   If she is only getting 30 days she still has refills left and can call her pharmacy for refills.

## 2022-09-27 NOTE — Telephone Encounter (Signed)
Called pt and informed her about the refills.

## 2022-09-28 ENCOUNTER — Telehealth: Payer: Self-pay | Admitting: Family Medicine

## 2022-10-08 NOTE — Telephone Encounter (Signed)
Opened in error

## 2022-10-11 ENCOUNTER — Other Ambulatory Visit: Payer: Self-pay | Admitting: Family Medicine

## 2022-11-02 DIAGNOSIS — Z950 Presence of cardiac pacemaker: Secondary | ICD-10-CM | POA: Diagnosis not present

## 2022-11-02 DIAGNOSIS — I10 Essential (primary) hypertension: Secondary | ICD-10-CM | POA: Diagnosis not present

## 2022-11-02 DIAGNOSIS — I495 Sick sinus syndrome: Secondary | ICD-10-CM | POA: Diagnosis not present

## 2022-11-02 DIAGNOSIS — Z1330 Encounter for screening examination for mental health and behavioral disorders, unspecified: Secondary | ICD-10-CM | POA: Diagnosis not present

## 2022-11-02 DIAGNOSIS — R809 Proteinuria, unspecified: Secondary | ICD-10-CM | POA: Diagnosis not present

## 2022-11-02 DIAGNOSIS — I442 Atrioventricular block, complete: Secondary | ICD-10-CM | POA: Diagnosis not present

## 2022-11-02 DIAGNOSIS — M7989 Other specified soft tissue disorders: Secondary | ICD-10-CM | POA: Diagnosis not present

## 2022-11-02 DIAGNOSIS — I25119 Atherosclerotic heart disease of native coronary artery with unspecified angina pectoris: Secondary | ICD-10-CM | POA: Diagnosis not present

## 2022-11-02 DIAGNOSIS — Z955 Presence of coronary angioplasty implant and graft: Secondary | ICD-10-CM | POA: Diagnosis not present

## 2022-11-16 DIAGNOSIS — M5417 Radiculopathy, lumbosacral region: Secondary | ICD-10-CM | POA: Diagnosis not present

## 2022-11-16 DIAGNOSIS — I10 Essential (primary) hypertension: Secondary | ICD-10-CM | POA: Diagnosis not present

## 2022-11-16 DIAGNOSIS — M722 Plantar fascial fibromatosis: Secondary | ICD-10-CM | POA: Diagnosis not present

## 2022-11-16 DIAGNOSIS — M79662 Pain in left lower leg: Secondary | ICD-10-CM | POA: Diagnosis not present

## 2022-12-08 DIAGNOSIS — W010XXA Fall on same level from slipping, tripping and stumbling without subsequent striking against object, initial encounter: Secondary | ICD-10-CM | POA: Diagnosis not present

## 2022-12-08 DIAGNOSIS — S0003XA Contusion of scalp, initial encounter: Secondary | ICD-10-CM | POA: Diagnosis not present

## 2022-12-08 DIAGNOSIS — S0181XA Laceration without foreign body of other part of head, initial encounter: Secondary | ICD-10-CM | POA: Diagnosis not present

## 2022-12-08 DIAGNOSIS — S199XXA Unspecified injury of neck, initial encounter: Secondary | ICD-10-CM | POA: Diagnosis not present

## 2022-12-08 DIAGNOSIS — S0101XA Laceration without foreign body of scalp, initial encounter: Secondary | ICD-10-CM | POA: Diagnosis not present

## 2022-12-08 DIAGNOSIS — S299XXA Unspecified injury of thorax, initial encounter: Secondary | ICD-10-CM | POA: Diagnosis not present

## 2022-12-08 DIAGNOSIS — S098XXA Other specified injuries of head, initial encounter: Secondary | ICD-10-CM | POA: Diagnosis not present

## 2022-12-08 DIAGNOSIS — S5002XA Contusion of left elbow, initial encounter: Secondary | ICD-10-CM | POA: Diagnosis not present

## 2022-12-08 DIAGNOSIS — Z743 Need for continuous supervision: Secondary | ICD-10-CM | POA: Diagnosis not present

## 2022-12-23 ENCOUNTER — Encounter: Payer: Self-pay | Admitting: Family Medicine

## 2022-12-23 ENCOUNTER — Ambulatory Visit: Payer: Medicare PPO | Admitting: Family Medicine

## 2022-12-23 VITALS — BP 124/50 | HR 70 | Ht 62.0 in | Wt 200.0 lb

## 2022-12-23 DIAGNOSIS — S0101XA Laceration without foreign body of scalp, initial encounter: Secondary | ICD-10-CM

## 2022-12-23 DIAGNOSIS — R29898 Other symptoms and signs involving the musculoskeletal system: Secondary | ICD-10-CM

## 2022-12-23 DIAGNOSIS — E118 Type 2 diabetes mellitus with unspecified complications: Secondary | ICD-10-CM | POA: Diagnosis not present

## 2022-12-23 DIAGNOSIS — I10 Essential (primary) hypertension: Secondary | ICD-10-CM

## 2022-12-23 DIAGNOSIS — R7301 Impaired fasting glucose: Secondary | ICD-10-CM

## 2022-12-23 LAB — POCT GLYCOSYLATED HEMOGLOBIN (HGB A1C): Hemoglobin A1C: 6.5 % — AB (ref 4.0–5.6)

## 2022-12-23 MED ORDER — AMBULATORY NON FORMULARY MEDICATION: 1 refills | Status: DC

## 2022-12-23 NOTE — Assessment & Plan Note (Signed)
Pressure looks much better today.

## 2022-12-23 NOTE — Progress Notes (Signed)
   Established Patient Office Visit  Subjective   Patient ID: [name], female    DOB: 05-15-1946  Age: 77 y.o. MRN: [id number]  Chief Complaint  Patient presents with   Suture / Staple Removal    HPI  [name] is here today for follow-up after recent emergency department visit on May 8 after a fall in a parking lot in [place] where she fell backwards out of a rolling walker with seat and struck her head.  She unfortunately suffered about an inch and a half laceration and is on blood thinners.  They did do a CT head and cervical spine with contrast which was negative.  She did land on her left elbow as well and has had some swelling.  She is that she has not had any residual headaches or concussive symptoms.  Did let me know she cut out her Dr. Alcus Dad since January.  She does occasionally have tea with monk fruit.    ROS    Objective:     BP (!) 124/50   Pulse 70   Ht 5\' 2"  (1.575 m)   Wt 200 lb (90.7 kg)   SpO2 96%   BMI 36.58 kg/m    Physical Exam Vitals reviewed.  Constitutional:      Appearance: She is well-developed.  HENT:     Head: Normocephalic and atraumatic.  Eyes:     Conjunctiva/sclera: Conjunctivae normal.  Cardiovascular:     Rate and Rhythm: Normal rate.  Pulmonary:     Effort: Pulmonary effort is normal.  Skin:    General: Skin is dry.     Coloration: Skin is not pale.     Comments: Large scab on her scalp. 5 Sutures easily removed.  Laceration is healing well.   Neurological:     Mental Status: She is alert and oriented to person, place, and time.  Psychiatric:        Behavior: Behavior normal.      Results for orders placed or performed in visit on 12/23/22  POCT glycosylated hemoglobin (Hb A1C)  Result Value Ref Range   Hemoglobin A1C 6.5 (A) 4.0 - 5.6 %   HbA1c POC (<> result, manual entry)     HbA1c, POC (prediabetic range)     HbA1c, POC (controlled diabetic range)        The 10-year ASCVD risk score (Arnett DK, et  al., 2019) is: 44.2%    Assessment & Plan:   Problem List Items Addressed This Visit       Cardiovascular and Mediastinum   Essential hypertension    Pressure looks much better today.        Endocrine   Diabetes mellitus type 2 with complications (HCC)    Done a great job cutting out soda and is back down to an A1c of 6.5 which is absolutely fantastic.      Relevant Orders   POCT glycosylated hemoglobin (Hb A1C) (Completed)   Other Visit Diagnoses     Laceration of scalp, initial encounter    -  Primary   Impaired fasting glucose       Weakness of both lower extremities       Relevant Medications   AMBULATORY NON FORMULARY MEDICATION       Will call for eye exam. 5 Sutures easily removed with forceps and scissors.  Patient tolerated procedure well.  Return in about 4 months (around 04/25/2023) for Diabetes follow-up.    Nani Gasser, MD

## 2022-12-23 NOTE — Assessment & Plan Note (Signed)
Done a great job cutting out soda and is back down to an A1c of 6.5 which is absolutely fantastic.

## 2023-01-10 ENCOUNTER — Other Ambulatory Visit: Payer: Self-pay | Admitting: Family Medicine

## 2023-02-01 DIAGNOSIS — I495 Sick sinus syndrome: Secondary | ICD-10-CM | POA: Diagnosis not present

## 2023-03-14 IMAGING — DX DG CHEST 2V
2 series · 2 of 2 positions shown · non-contrast
Comparison: None.

CLINICAL DATA: Shortness of breath, cough for several months

EXAM:
CHEST - 2 VIEW

[chest pa]
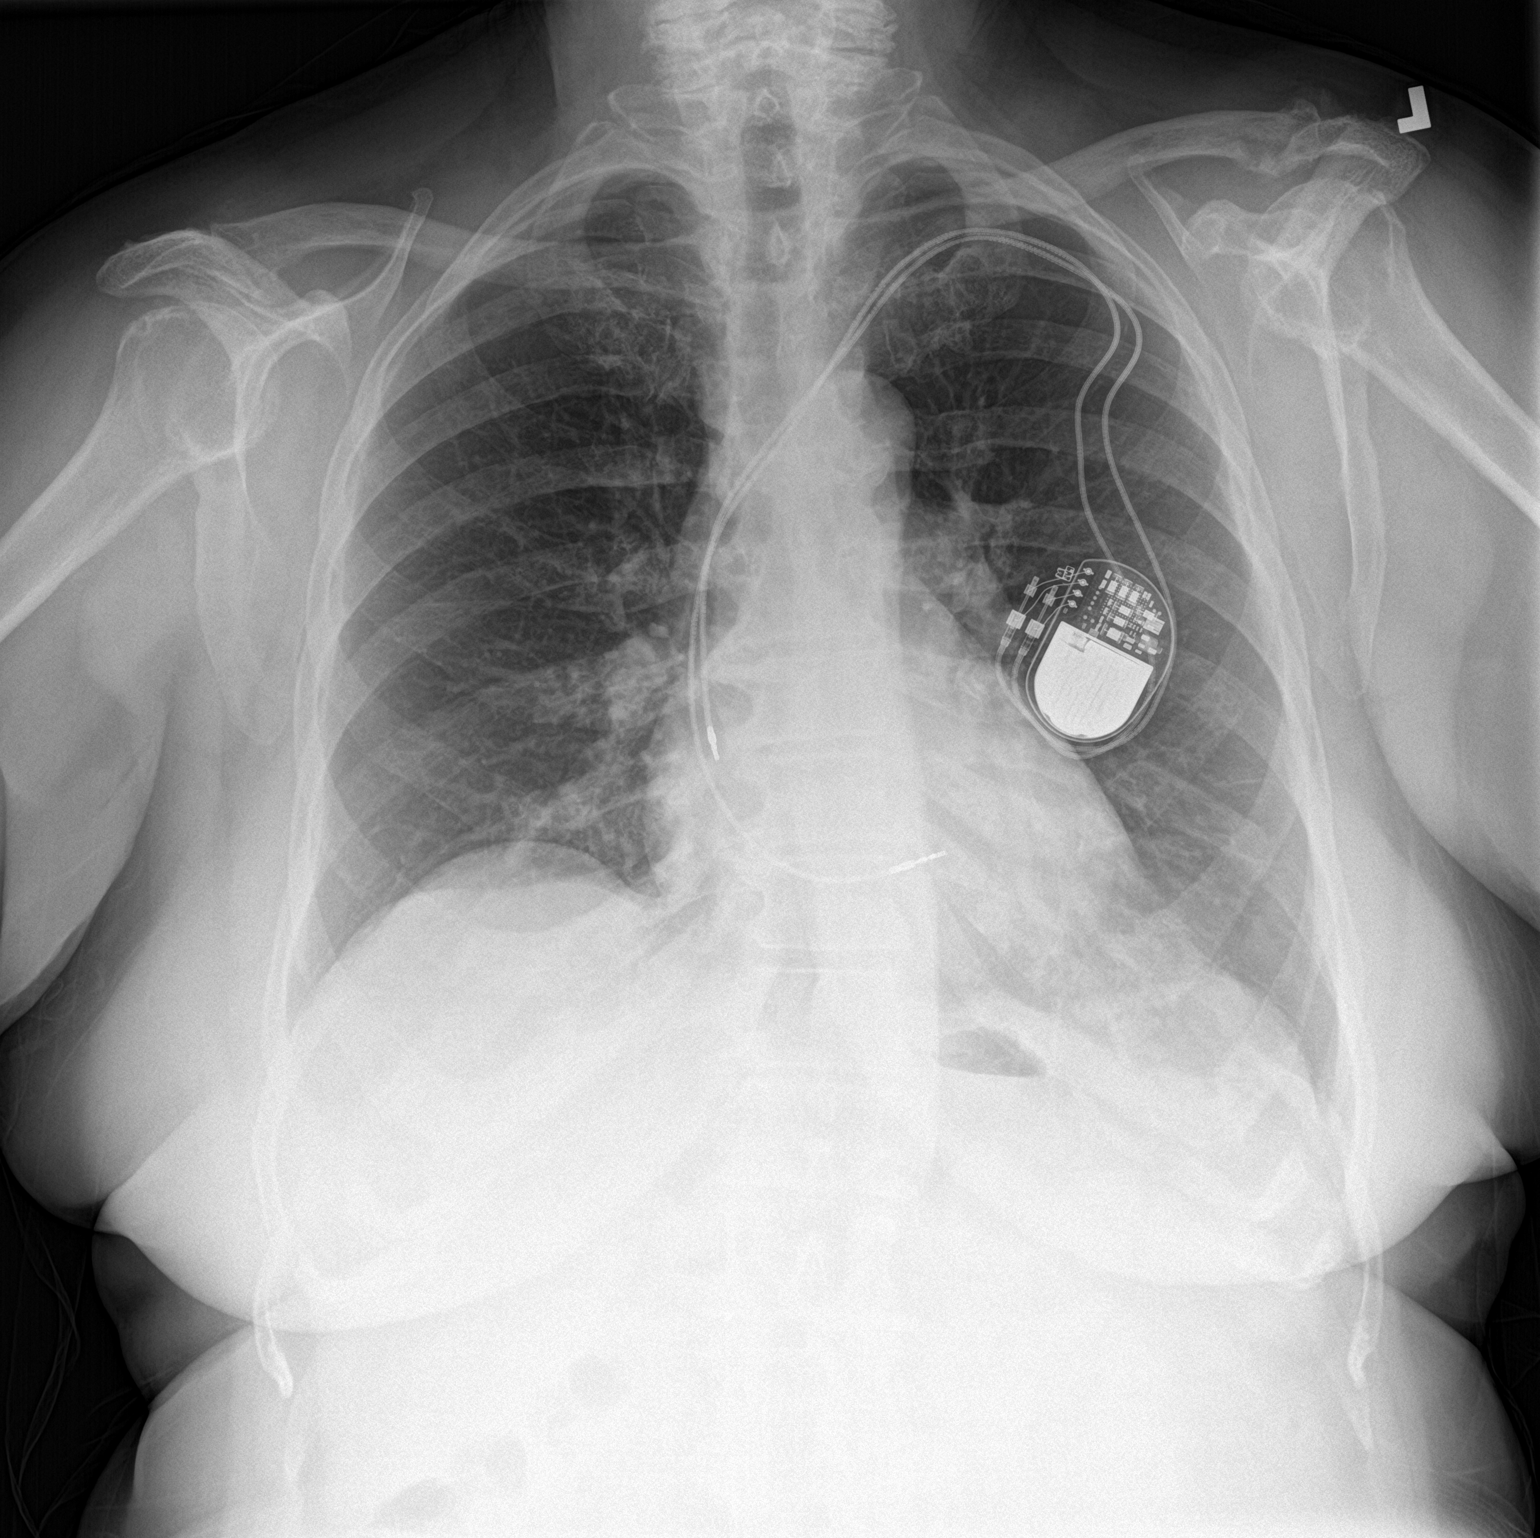

[chest lat]
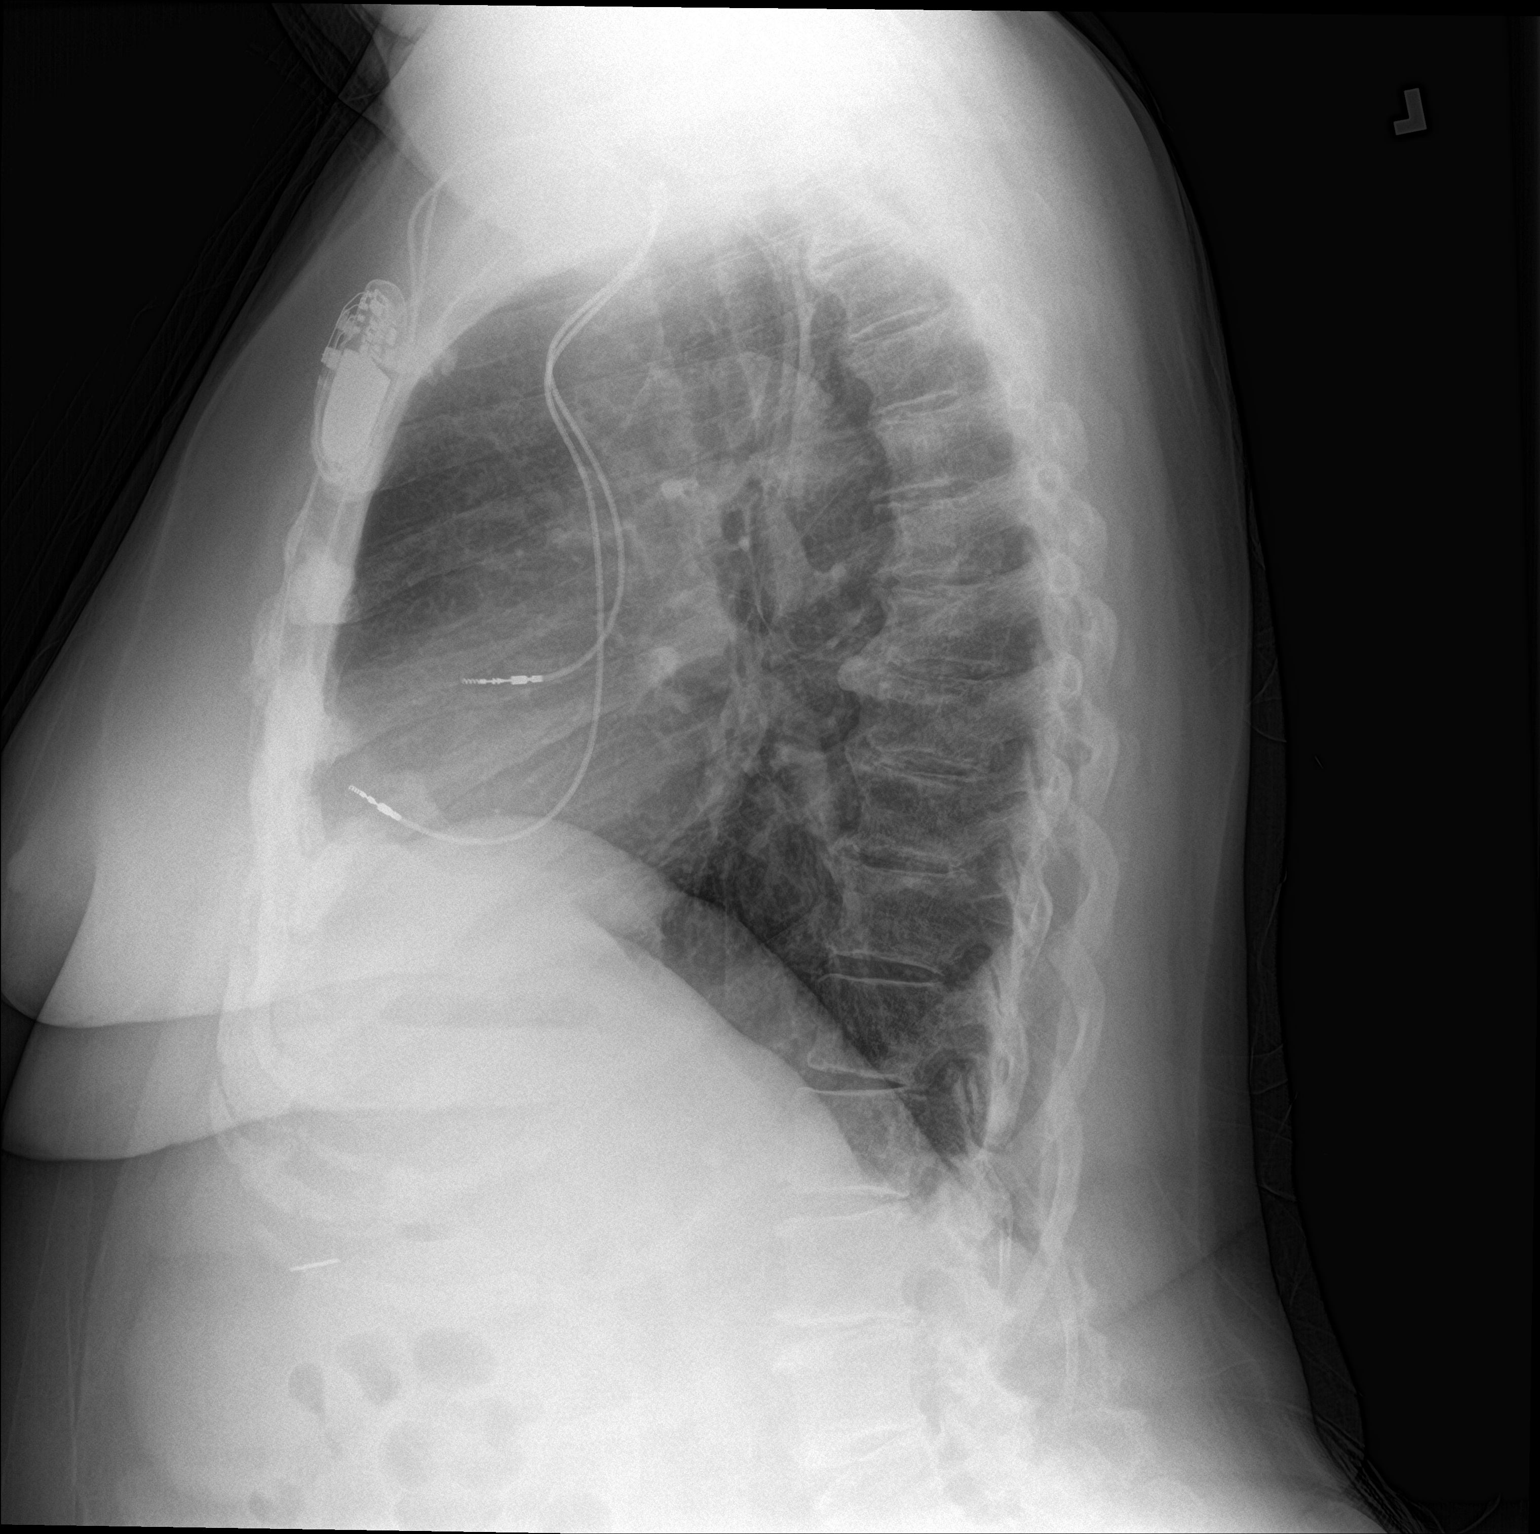

[2 of 2 positions shown; findings below may reference images not displayed]

FINDINGS: The heart size and mediastinal contours are within normal limits.
Left chest multi lead pacer. Both lungs are clear. Disc degenerative
disease of the thoracic spine.
IMPRESSION: No acute abnormality of the lungs.

## 2023-03-15 ENCOUNTER — Ambulatory Visit: Payer: Medicare PPO | Admitting: Family Medicine

## 2023-03-15 ENCOUNTER — Encounter: Payer: Self-pay | Admitting: Family Medicine

## 2023-03-15 VITALS — BP 134/62 | HR 82 | Ht 62.0 in | Wt 198.0 lb

## 2023-03-15 DIAGNOSIS — H9202 Otalgia, left ear: Secondary | ICD-10-CM | POA: Diagnosis not present

## 2023-03-15 MED ORDER — CIPROFLOXACIN HCL 0.2 % OT SOLN: 0.2500 mL | Freq: Two times a day (BID) | OTIC | 0 refills | Status: DC

## 2023-03-15 NOTE — Progress Notes (Signed)
   Acute Office Visit  Subjective:     Patient ID: [name], female    DOB: 08/23/1945, 77 y.o.   MRN: [id number]  Chief Complaint  Patient presents with   Otitis Media    L ear pain X 2months    HPI Patient is in today for ear pain on left x 2 months.   She says it has been intermittent but the last few days it has been little bit more persistent to the point where it almost brought her to tears yesterday.  No drainage.  She has had some sinus symptoms on and off and thought initially maybe that was causing the discomfort.  He also feels like there is water shifting in the ear.  ROS      Objective:    BP 134/62   Pulse 82   Ht 5\' 2"  (1.575 m)   Wt 198 lb (89.8 kg)   SpO2 100%   BMI 36.21 kg/m    Physical Exam HENT:     Head:     Comments: Right TM appears normal just a small amount of wax present.  The left TM appears to be more obstructed by wax potentially laying up against air drum it looks dark and I am unable to see the ossicle.  And there is a smaller amount of dead skin in front of that. Musculoskeletal:     Cervical back: Neck supple.  Lymphadenopathy:     Cervical: No cervical adenopathy.     No results found for any visits on 03/15/23.      Assessment & Plan:   Problem List Items Addressed This Visit   None Visit Diagnoses     Left ear pain    -  Primary      Left ear pain-cerumen was completely blocking the TM on the left side and look like it was pushed up against the TM was pretty far back.  We did perform an irrigation and cerumen removal we were able to get about 50% of the cerumen out unable to visualize about half of the TM.  No significant erythema.  Will treat with Cipro otic drops.  If not improving over the next week please let us know if develops new or worsening symptoms or increased ear pain then please let us know.  Meds ordered this encounter  Medications   Ciprofloxacin HCl 0.2 % otic solution    Sig: Place 0.25 mLs into  the left ear 2 (two) times daily.    Dispense:  14 each    Refill:  0   Indication: Cerumen impaction of the ear(s) Medical necessity statement: On physical examination, cerumen impairs clinically significant portions of the external auditory canal, and tympanic membrane. Noted obstructive, copious cerumen that cannot be removed without magnification and instrumentations Consent: Discussed benefits and risks of procedure and verbal consent obtained Procedure: Patient was prepped for the procedure. Utilized an otoscope to assess and take note of the ear canal, the tympanic membrane, and the presence, amount, and placement of the cerumen. Gentle water irrigation and soft plastic curette was utilized to remove cerumen.  Post procedure examination: shows cerumen was completely removed. Patient tolerated procedure well. The patient is made aware that they may experience temporary vertigo, temporary hearing loss, and temporary discomfort. If these symptom last for more than 24 hours to call the clinic or proceed to the ED.   No follow-ups on file.  Nani Gasser, MD

## 2023-03-24 DIAGNOSIS — L02611 Cutaneous abscess of right foot: Secondary | ICD-10-CM | POA: Diagnosis not present

## 2023-03-24 DIAGNOSIS — M778 Other enthesopathies, not elsewhere classified: Secondary | ICD-10-CM | POA: Diagnosis not present

## 2023-03-24 DIAGNOSIS — L03031 Cellulitis of right toe: Secondary | ICD-10-CM | POA: Diagnosis not present

## 2023-03-24 DIAGNOSIS — M7989 Other specified soft tissue disorders: Secondary | ICD-10-CM | POA: Diagnosis not present

## 2023-03-24 DIAGNOSIS — M19071 Primary osteoarthritis, right ankle and foot: Secondary | ICD-10-CM | POA: Diagnosis not present

## 2023-03-28 ENCOUNTER — Other Ambulatory Visit: Payer: Self-pay | Admitting: Family Medicine

## 2023-03-28 DIAGNOSIS — G2581 Restless legs syndrome: Secondary | ICD-10-CM

## 2023-03-28 DIAGNOSIS — G629 Polyneuropathy, unspecified: Secondary | ICD-10-CM

## 2023-03-29 ENCOUNTER — Ambulatory Visit: Payer: Medicare PPO | Admitting: Family Medicine

## 2023-04-11 ENCOUNTER — Other Ambulatory Visit: Payer: Self-pay | Admitting: Family Medicine

## 2023-04-19 DIAGNOSIS — M79672 Pain in left foot: Secondary | ICD-10-CM | POA: Diagnosis not present

## 2023-04-19 DIAGNOSIS — I10 Essential (primary) hypertension: Secondary | ICD-10-CM | POA: Diagnosis not present

## 2023-04-19 DIAGNOSIS — G8929 Other chronic pain: Secondary | ICD-10-CM | POA: Diagnosis not present

## 2023-04-19 DIAGNOSIS — M47816 Spondylosis without myelopathy or radiculopathy, lumbar region: Secondary | ICD-10-CM | POA: Diagnosis not present

## 2023-04-19 DIAGNOSIS — M79671 Pain in right foot: Secondary | ICD-10-CM | POA: Diagnosis not present

## 2023-04-19 DIAGNOSIS — M25571 Pain in right ankle and joints of right foot: Secondary | ICD-10-CM | POA: Diagnosis not present

## 2023-04-19 DIAGNOSIS — M25572 Pain in left ankle and joints of left foot: Secondary | ICD-10-CM | POA: Diagnosis not present

## 2023-04-19 DIAGNOSIS — G629 Polyneuropathy, unspecified: Secondary | ICD-10-CM | POA: Diagnosis not present

## 2023-05-27 DIAGNOSIS — Z955 Presence of coronary angioplasty implant and graft: Secondary | ICD-10-CM | POA: Diagnosis not present

## 2023-05-27 DIAGNOSIS — Z950 Presence of cardiac pacemaker: Secondary | ICD-10-CM | POA: Diagnosis not present

## 2023-05-27 DIAGNOSIS — I2510 Atherosclerotic heart disease of native coronary artery without angina pectoris: Secondary | ICD-10-CM | POA: Diagnosis not present

## 2023-05-27 DIAGNOSIS — I10 Essential (primary) hypertension: Secondary | ICD-10-CM | POA: Diagnosis not present

## 2023-05-27 DIAGNOSIS — I442 Atrioventricular block, complete: Secondary | ICD-10-CM | POA: Diagnosis not present

## 2023-05-27 DIAGNOSIS — I495 Sick sinus syndrome: Secondary | ICD-10-CM | POA: Diagnosis not present

## 2023-07-11 ENCOUNTER — Encounter: Payer: Self-pay | Admitting: Family Medicine

## 2023-07-11 ENCOUNTER — Ambulatory Visit (INDEPENDENT_AMBULATORY_CARE_PROVIDER_SITE_OTHER): Payer: Medicare PPO | Admitting: Family Medicine

## 2023-07-11 VITALS — Ht 62.0 in | Wt 198.0 lb

## 2023-07-11 DIAGNOSIS — Z0000 Encounter for general adult medical examination without abnormal findings: Secondary | ICD-10-CM | POA: Diagnosis not present

## 2023-07-11 NOTE — Progress Notes (Signed)
Subjective:   [name] is a 77 y.o. female who presents for Medicare Annual (Subsequent) preventive examination.  Visit Complete: Virtual I connected with  Marylene Land on 07/11/23 by a audio enabled telemedicine application and verified that I am speaking with the correct person using two identifiers.  Patient Location: Home  Provider Location: Office/Clinic  I discussed the limitations of evaluation and management by telemedicine. The patient expressed understanding and agreed to proceed.  Vital Signs: Because this visit was a virtual/telehealth visit, some criteria may be missing or patient reported. Any vitals not documented were not able to be obtained and vitals that have been documented are patient reported.  Patient Medicare AWV questionnaire was completed by the patient on 07/11/23; I have confirmed that all information answered by patient is correct and no changes since this date.  Cardiac Risk Factors include: advanced age (>1men, >105 women);obesity (BMI >30kg/m2)     Objective:    Today's Vitals   07/11/23 0812  Weight: 198 lb (89.8 kg)  Height: 5\' 2"  (1.575 m)   Body mass index is 36.21 kg/m.     07/11/2023    8:25 AM 07/07/2022    8:11 AM 12/07/2017    6:24 PM 11/21/2017    3:03 PM 10/27/2017    9:20 AM 06/06/2017    7:28 PM 06/06/2017   10:25 AM  Advanced Directives  Does Patient Have a Medical Advance Directive? Yes Yes Yes Yes No Yes Yes  Type of Advance Directive Living will Living will  Living will  Healthcare Power of [place];Living will Healthcare Power of [place];Living will  Does patient want to make changes to medical advance directive?      No - Patient declined No - Patient declined  Copy of Healthcare Power of Attorney in Chart?      No - copy requested No - copy requested  Would patient like information on creating a medical advance directive?     No - Patient declined      Current Medications (verified) Outpatient Encounter  Medications as of 07/11/2023  Medication Sig   albuterol (VENTOLIN HFA) 108 (90 Base) MCG/ACT inhaler Inhale into the lungs.   AMBULATORY NON FORMULARY MEDICATION Medication Name: Rolling walker with seat.  Dx. Lower extremity weakness.   aspirin EC 81 MG tablet Take 81 mg by mouth daily. Swallow whole.   blood glucose meter kit and supplies KIT Dispense based on patient and insurance preference. Use up to once daily as directed. Dx. Diabetes   chlorthalidone (HYGROTON) 25 MG tablet Take 25 mg by mouth daily.   clopidogrel (PLAVIX) 75 MG tablet Take 75 mg by mouth daily.   ENTRESTO 97-103 MG Take 1 tablet by mouth 2 (two) times daily.   gabapentin (NEURONTIN) 300 MG capsule TAKE ONE CAPSULE BY MOUTH AT BEDTIME   metoprolol succinate (TOPROL-XL) 50 MG 24 hr tablet Take 50 mg by mouth daily. Take with or immediately following a meal.   pantoprazole (PROTONIX) 40 MG tablet TAKE ONE TABLET BY MOUTH ONE TIME DAILY   atorvastatin (LIPITOR) 20 MG tablet Take 20 mg by mouth daily. (Patient not taking: Reported on 07/11/2023)   Ciprofloxacin HCl 0.2 % otic solution Place 0.25 mLs into the left ear 2 (two) times daily. (Patient not taking: Reported on 07/11/2023)   cyanocobalamin (VITAMIN B12) 1000 MCG tablet Take 1,000 mcg by mouth daily. (Patient not taking: Reported on 07/11/2023)   ferrous gluconate (FERGON) 324 MG tablet Take by mouth. (Patient not  taking: Reported on 07/11/2023)   primidone (MYSOLINE) 50 MG tablet Take 0.5 tablets (25 mg total) by mouth at bedtime. (Patient not taking: Reported on 07/11/2023)   thiamine 250 MG tablet Take 1 tablet by mouth daily. (Patient not taking: Reported on 07/11/2023)   No facility-administered encounter medications on file as of 07/11/2023.    Allergies (verified) Morphine and Brilinta [ticagrelor]   History: Past Medical History:  Diagnosis Date   Arthritis    "qwhere" (06/06/2017)   Chronic bilateral low back pain    CKD (chronic kidney disease) stage 3,  GFR 30-59 ml/min (HCC) 09/21/2012   Complication of anesthesia    slow to wake up "once" (06/06/2017)   DDD (degenerative disc disease), cervical    Dysrhythmia    Esophageal stricture    GERD 10/04/2007   Qualifier: Diagnosis of  By: Linford Arnold MD, Catherine     HIATAL HERNIA WITH REFLUX 10/04/2007   Qualifier: Diagnosis of  By: Linford Arnold MD, Catherine     History of hiatal hernia    History of kidney stones    Hyperlipidemia 08/10/2010   Qualifier: Diagnosis of  By: Linford Arnold MD, Catherine     Hypertension    HYPERTENSION, BENIGN ESSENTIAL 04/27/2007   Qualifier: Diagnosis of  By: Linford Arnold MD, Santina Evans     Impaired fasting glucose 08/10/2010   Qualifier: Diagnosis of  By: Linford Arnold MD, Catherine     Lobar pneumonia [facility])    "never missed a day of work w/it"   Other specified cardiac arrhythmias 01/02/2015   Presence of permanent cardiac pacemaker    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 Laurier Nancy, [facility]    Seborrheic dermatitis 06/12/2014   Past Surgical History:  Procedure Laterality Date   ABDOMINAL HYSTERECTOMY     BACK SURGERY     BLADDER SUSPENSION     CARPAL TUNNEL RELEASE Bilateral    CYSTOSCOPY W/ STONE MANIPULATION     DILATION AND CURETTAGE OF UTERUS     ESOPHAGOGASTRODUODENOSCOPY (EGD) WITH ESOPHAGEAL DILATION  X 1   GUM SURGERY     ramus frame implant lower gum   INSERT / REPLACE / REMOVE PACEMAKER  08/2012   JOINT REPLACEMENT     LUMBAR DISC SURGERY  X 2   TARSAL TUNNEL RELEASE Bilateral    TONSILLECTOMY     TOTAL HIP ARTHROPLASTY Left 06/06/2017   TOTAL HIP ARTHROPLASTY Left 06/06/2017   Procedure: TOTAL HIP ARTHROPLASTY ANTERIOR APPROACH;  Surgeon: [name], MD;  Location: [facility] OR;  Service: Orthopedics;  Laterality: Left;   TOTAL KNEE ARTHROPLASTY Left 2006   TOTAL KNEE ARTHROPLASTY Right 11/30/2017   Procedure: TOTAL KNEE ARTHROPLASTY;  Surgeon: [name], MD;  Location: [facility] OR;  Service: Orthopedics;   Laterality: Right;   TOTAL KNEE REVISION Left 2007   new hardware  due to staph infection, on chronic keflex   TRANSTHORACIC ECHOCARDIOGRAM     08/14/12 [facility]): NL LVF, no segmental abnormality, borderline LVH, mild MR, mild MR, trace TR   Family History  Problem Relation Age of Onset   Heart attack Mother    Diabetes Mother 65   Hypertension Mother    Hypertension Father    Social History   Socioeconomic History   Marital status: Widowed    Spouse name: Not on file   Number of children: 2   Years of education: 16   Highest education level: Bachelor's degree (e.g., BA, AB, BS)  Occupational History  Occupation: Retired  Tobacco Use   Smoking status: Never   Smokeless tobacco: Never  Vaping Use   Vaping status: Never Used  Substance and Sexual Activity   Alcohol use: No   Drug use: No   Sexual activity: Not Currently  Other Topics Concern   Not on file  Social History Narrative   Lives alone. She has two children; one has deceased. Her daughter lives in [place]. She enjoys reading, adult coloring, being outside and crafts.   Social Determinants of Health   Financial Resource Strain: Low Risk  (07/11/2023)   Overall Financial Resource Strain (CARDIA)    Difficulty of Paying Living Expenses: Not hard at all  Food Insecurity: No Food Insecurity (07/11/2023)   Hunger Vital Sign    Worried About Running Out of Food in the Last Year: Never true    Ran Out of Food in the Last Year: Never true  Transportation Needs: No Transportation Needs (07/11/2023)   PRAPARE - Administrator, Civil Service (Medical): No    Lack of Transportation (Non-Medical): No  Physical Activity: Inactive (07/11/2023)   Exercise Vital Sign    Days of Exercise per Week: 0 days    Minutes of Exercise per Session: 0 min  Stress: No Stress Concern Present (07/11/2023)   [employer] of Occupational Health - Occupational Stress Questionnaire    Feeling of Stress : Not  at all  Social Connections: Socially Isolated (07/11/2023)   Social Connection and Isolation Panel [NHANES]    Frequency of Communication with Friends and Family: Twice a week    Frequency of Social Gatherings with Friends and Family: Once a week    Attends Religious Services: Never    Database administrator or Organizations: No    Attends Banker Meetings: Never    Marital Status: Widowed    Tobacco Counseling Counseling given: Not Answered   Clinical Intake:  Pre-visit preparation completed: No  Pain : No/denies pain     BMI - recorded: 36.2 Nutritional Status: BMI > 30  Obese Nutritional Risks: None Diabetes: No  How often do you need to have someone help you when you read instructions, pamphlets, or other written materials from your doctor or pharmacy?: 1 - Never What is the last grade level you completed in school?: 16  Interpreter Needed?: No      Activities of Daily Living    07/11/2023    8:15 AM  In your present state of health, do you have any difficulty performing the following activities:  Hearing? 1  Comment hearing loss of 45 %  Vision? 0  Difficulty concentrating or making decisions? 0  Walking or climbing stairs? 1  Comment uses walker and does not climb stairs  Dressing or bathing? 0  Doing errands, shopping? 1  Comment daughter goes with patient  Quarry manager and eating ? N  Using the Toilet? N  In the past six months, have you accidently leaked urine? Y  Comment early in the morning  Do you have problems with loss of bowel control? N  Managing your Medications? N  Managing your Finances? N  Housekeeping or managing your Housekeeping? N    Patient Care Team: Agapito Games, MD as PCP - General rick gilliam as Referring Physician (Optometry) Heath Lark, Cardiology [place], Jamelle Haring, Orthopedics     Indicate any recent Medical Services you may have received from other than [facility] providers in the past year (date may be  approximate).  Assessment:   This is a routine wellness examination for Sherrie.  Hearing/Vision screen Hearing Screening - Comments:: Unable to test, hearing loss reported. Vision Screening - Comments:: Unable to test, grossly intact per report    Goals Addressed             This Visit's Progress    Mobility and Independence Optimized       Evidence-based guidance:  Refer to physical therapy or occupational therapy for assessment and individualized program.  Provide therapy that may include functional task training, balance, active or passive exercise, spasticity management, assistive device training, cardiorespiratory fitness, Pilates and telerehabilitation services.  Identify barriers to participation in therapy or exercise such as pain with activity, anticipated or imagined pain, transportation, depression or fear.  Work with patient and family to develop self-management plan to remove barriers.  Refer to speech/language pathologist to assess and treat speech/language deficit and swallowing impairment.  Periodically review ability to perform activities of daily living and required amount of assistance needed for safety, optimal independence and self-care.  Encourage appropriate vocational or educational counseling for re-entering the community, workplace or school; consider a driving evaluation.  Assist patient to advocate for necessary adaptations to the work or school environment.  Refer to employer for information about FMLA (Family and Medical Leave Act), Short or Long-Term Disability and options for adjustments to work schedule, assignment or hours.   Notes:       Depression Screen    07/11/2023    8:24 AM 07/07/2022    8:12 AM 01/21/2020    4:03 PM 03/28/2018    2:46 PM 12/13/2016    1:36 PM 09/30/2015    3:30 PM 06/12/2014   11:56 AM  PHQ 2/9 Scores  PHQ - 2 Score 0 1 0 2 0 0 0  PHQ- 9 Score    13   4    Fall Risk    07/11/2023    8:26 AM 07/07/2022    8:11 AM  01/21/2020    4:03 PM 03/28/2018    2:41 PM 12/13/2016    1:36 PM  Fall Risk   Falls in the past year? 0 1 0  No  Number falls in past yr: 0 0     Injury with Fall? 0 0     Risk for fall due to : Impaired balance/gait History of fall(s);Impaired mobility No Fall Risks Impaired balance/gait;Impaired mobility   Risk for fall due to: Comment uses walker      Follow up  Falls evaluation completed       MEDICARE RISK AT HOME: Medicare Risk at Home Any stairs in or around the home?: No If so, are there any without handrails?: No Home free of loose throw rugs in walkways, pet beds, electrical cords, etc?: Yes Adequate lighting in your home to reduce risk of falls?: Yes Life alert?: No Use of a cane, walker or w/c?: Yes Grab bars in the bathroom?: Yes Shower chair or bench in shower?: Yes Elevated toilet seat or a handicapped toilet?: Yes  TIMED UP AND GO:  Was the test performed?  No    Cognitive Function:        07/11/2023    8:27 AM 07/07/2022    8:21 AM  6CIT Screen  What Year? 0 points 0 points  What month? 0 points 0 points  What time? 0 points 0 points  Count back from 20 0 points 0 points  Months in reverse 4 points 4 points  Repeat  phrase 0 points 0 points  Total Score 4 points 4 points    Immunizations Immunization History  Administered Date(s) Administered   Fluad Quad(high Dose 65+) 05/11/2019, 05/14/2021, 08/13/2022   Influenza Split 05/02/2012   Influenza Whole 04/27/2007, 05/03/2008, 05/04/2010   Influenza,inj,Quad PF,6+ Mos 05/23/2014, 06/04/2015   Influenza-Unspecified 05/13/2016, 05/11/2017, 05/11/2018   PNEUMOCOCCAL CONJUGATE-20 08/13/2022   Pneumococcal Polysaccharide-23 08/02/2006, 03/20/2012   Td 08/03/2003   Tdap 05/23/2014    TDAP status: Up to date  Flu Vaccine status: Up to date  Pneumococcal vaccine status: Up to date  Covid-19 vaccine status: Declined, Education has been provided regarding the importance of this vaccine but patient  still declined. Advised may receive this vaccine at local pharmacy or Health Dept.or vaccine clinic. Aware to provide a copy of the vaccination record if obtained from local pharmacy or Health Dept. Verbalized acceptance and understanding.  Qualifies for Shingles Vaccine? Yes   Zostavax completed No   Shingrix Completed?: Yes  Screening Tests Health Maintenance  Topic Date Due   OPHTHALMOLOGY EXAM  Never done   DEXA SCAN  Never done   INFLUENZA VACCINE  03/03/2023   COVID-19 Vaccine (1 - 2023-24 season) Never done   HEMOGLOBIN A1C  06/25/2023   Zoster Vaccines- Shingrix (1 of 2) 08/02/2023 (Originally [birth year]1994)   Diabetic kidney evaluation - eGFR measurement  08/14/2023   Diabetic kidney evaluation - Urine ACR  08/14/2023   FOOT EXAM  12/23/2023   DTaP/Tdap/Td (3 - Td or Tdap) 05/23/2024   Medicare Annual Wellness (AWV)  07/10/2024   Pneumonia Vaccine 76+ Years old  Completed   Hepatitis C Screening  Completed   HPV VACCINES  Aged Out   Colonoscopy  Discontinued    Health Maintenance  Health Maintenance Due  Topic Date Due   OPHTHALMOLOGY EXAM  Never done   DEXA SCAN  Never done   INFLUENZA VACCINE  03/03/2023   COVID-19 Vaccine (1 - 2023-24 season) Never done   HEMOGLOBIN A1C  06/25/2023    Colorectal cancer screening: No longer required.   Mammogram status: No longer required due to refuses to get this.  Bone density: refuses this test.   Lung Cancer Screening: (Low Dose CT Chest recommended if Age 48-80 years, 20 pack-year currently smoking OR have quit w/in 15years.) does not qualify.   Lung Cancer Screening Referral: n/a   Additional Screening:  Hepatitis C Screening: does qualify; Completed 01/22/2020  Vision Screening: Recommended annual ophthalmology exams for early detection of glaucoma and other disorders of the eye. Is the patient up to date with their annual eye exam?  Yes  Who is the provider or what is the name of the office in which the patient  attends annual eye exams? [facility],  If pt is not established with a provider, would they like to be referred to a provider to establish care? No .   Dental Screening: Recommended annual dental exams for proper oral hygiene  Diabetic Foot Exam: Diabetic Foot Exam: Completed 12/23/22  Community Resource Referral / Chronic Care Management: CRR required this visit?  No   CCM required this visit?  No     Plan:     I have personally reviewed and noted the following in the patient's chart:   Medical and social history Use of alcohol, tobacco or illicit drugs  Current medications and supplements including opioid prescriptions. Patient is not currently taking opioid prescriptions. Functional ability and status Nutritional status Physical activity Advanced directives List of other physicians Hospitalizations,  surgeries, and ER visits in previous 12 months: Went to ED for abscessed toe.  Vitals Screenings to include cognitive, depression, and falls Referrals and appointments  In addition, I have reviewed and discussed with patient certain preventive protocols, quality metrics, and best practice recommendations. A written personalized care plan for preventive services as well as general preventive health recommendations were provided to patient.     Novella Olive, FNP   07/11/2023   After Visit Summary: (MyChart) Due to this being a telephonic visit, the after visit summary with patients personalized plan was offered to patient via MyChart   Follow-up with PCP as scheduled.  Declines referral today for bone density. Declines need for Covid vaccines. Reports up to date on Shingles vaccine.

## 2023-07-12 ENCOUNTER — Other Ambulatory Visit: Payer: Self-pay | Admitting: Family Medicine

## 2023-07-14 ENCOUNTER — Telehealth: Payer: Self-pay

## 2023-07-14 MED ORDER — PANTOPRAZOLE SODIUM 40 MG PO TBEC: 40.0000 mg | DELAYED_RELEASE_TABLET | Freq: Every day | ORAL | 0 refills | Status: DC

## 2023-07-14 NOTE — Telephone Encounter (Signed)
Pantoprazole sent as 90 day refill per protocol

## 2023-07-14 NOTE — Telephone Encounter (Signed)
Copied from CRM [id number]. Topic: Clinical - Medication Refill >> Jul 13, 2023 11:49 AM Louie Boston wrote: Most Recent Primary Care Visit:  Provider: Nani Gasser D  Department: [facility]  Visit Type: SAME DAY  Date: 03/15/2023  Medication: pantoprazole (PROTONIX) 40 MG tablet  Has the patient contacted their pharmacy? Yes (Agent: If no, request that the patient contact the pharmacy for the refill. If patient does not wish to contact the pharmacy document the reason why and proceed with request.) (Agent: If yes, when and what did the pharmacy advise?)  Patient was advised by pharmacy to contact us for refill. Patient is out of medication and needs the refill.   Is this the correct pharmacy for this prescription? Yes If no, delete pharmacy and type the correct one.  This is the patient's preferred pharmacy:  [facility] [address] - [place], [place] - [address], [address] [place] [address] Phone: [phone] Fax: [phone]   Has the prescription been filled recently? Yes  Is the patient out of the medication? Yes  Has the patient been seen for an appointment in the last year OR does the patient have an upcoming appointment? Yes  Can we respond through MyChart? No  Agent: Please be advised that Rx refills may take up to 3 business days. We ask that you follow-up with your pharmacy.

## 2023-09-12 ENCOUNTER — Telehealth: Payer: Self-pay | Admitting: Family Medicine

## 2023-09-12 NOTE — Telephone Encounter (Signed)
 Pt daughter dropped off Bonita placard to be completed.  Forms placed in to Tonya B box.   CB:[phone]

## 2023-09-16 NOTE — Telephone Encounter (Signed)
Completed, signed and given back to [place] B

## 2023-09-16 NOTE — Telephone Encounter (Signed)
No VM set up.   Form up front in folder.

## 2023-09-16 NOTE — Telephone Encounter (Signed)
See phone note

## 2023-09-19 NOTE — Telephone Encounter (Signed)
 Patients daughter came and picked up form today 09/19/23, thanks.

## 2023-09-26 ENCOUNTER — Other Ambulatory Visit: Payer: Self-pay | Admitting: Family Medicine

## 2023-09-26 DIAGNOSIS — G2581 Restless legs syndrome: Secondary | ICD-10-CM

## 2023-09-26 DIAGNOSIS — G629 Polyneuropathy, unspecified: Secondary | ICD-10-CM

## 2023-09-30 ENCOUNTER — Other Ambulatory Visit: Payer: Self-pay | Admitting: Family Medicine

## 2023-09-30 DIAGNOSIS — G2581 Restless legs syndrome: Secondary | ICD-10-CM

## 2023-09-30 DIAGNOSIS — G629 Polyneuropathy, unspecified: Secondary | ICD-10-CM

## 2023-09-30 MED ORDER — GABAPENTIN 300 MG PO CAPS
300.0000 mg | ORAL_CAPSULE | Freq: Every day | ORAL | 1 refills | Status: DC
Start: 2023-09-30 — End: 2023-10-03

## 2023-09-30 NOTE — Telephone Encounter (Signed)
 Patient needs an appointment for follow-up.  She is due for 30-month checkup.  Once we get an appointment on the books then I will refill her medication.  We do not have to schedule her for next week it can be a little further out that is fine but I do just need to know when the appointment is before I do her refill.

## 2023-09-30 NOTE — Telephone Encounter (Signed)
 Last Fill: 03/30/23  Last OV: 07/11/23 AWV Next OV: None Scheduled  Routing to provider for review/authorization.

## 2023-09-30 NOTE — Telephone Encounter (Signed)
 Copied from CRM [id number]. Topic: Clinical - Medication Refill >> Sep 30, 2023 10:25 AM Nila Nephew wrote: Most Recent Primary Care Visit:  Provider: Novella Olive  Department: [facility]  Visit Type: MEDICARE AWV, SEQUENTIAL  Date: 07/11/2023  Medication:  gabapentin (NEURONTIN) 300 MG capsule   Has the patient contacted their pharmacy? Yes - Patient has been waiting four days for a response from our office.   Is this the correct pharmacy for this prescription? Yes  This is the patient's preferred pharmacy:  [facility] [address] - [place], [place] - [address] [place] [address] Phone: [phone] Fax: [phone]   Has the prescription been filled recently? No  Is the patient out of the medication? Yes  Has the patient been seen for an appointment in the last year OR does the patient have an upcoming appointment? Yes  Can we respond through MyChart? No - Phone  Agent: Please be advised that Rx refills may take up to 3 business days. We ask that you follow-up with your pharmacy.

## 2023-10-03 NOTE — Telephone Encounter (Signed)
 I have her scheduled for Friday April 11th @ 2:20pm

## 2023-11-11 ENCOUNTER — Ambulatory Visit: Admitting: Family Medicine

## 2023-11-14 ENCOUNTER — Ambulatory Visit: Admitting: Family Medicine

## 2023-11-14 ENCOUNTER — Encounter: Payer: Self-pay | Admitting: Family Medicine

## 2023-11-14 VITALS — BP 128/49 | HR 74 | Ht 62.0 in | Wt 198.0 lb

## 2023-11-14 DIAGNOSIS — N1831 Chronic kidney disease, stage 3a: Secondary | ICD-10-CM | POA: Diagnosis not present

## 2023-11-14 DIAGNOSIS — R29898 Other symptoms and signs involving the musculoskeletal system: Secondary | ICD-10-CM

## 2023-11-14 DIAGNOSIS — E611 Iron deficiency: Secondary | ICD-10-CM | POA: Insufficient documentation

## 2023-11-14 DIAGNOSIS — E785 Hyperlipidemia, unspecified: Secondary | ICD-10-CM | POA: Diagnosis not present

## 2023-11-14 DIAGNOSIS — I129 Hypertensive chronic kidney disease with stage 1 through stage 4 chronic kidney disease, or unspecified chronic kidney disease: Secondary | ICD-10-CM

## 2023-11-14 DIAGNOSIS — E118 Type 2 diabetes mellitus with unspecified complications: Secondary | ICD-10-CM | POA: Diagnosis not present

## 2023-11-14 DIAGNOSIS — I10 Essential (primary) hypertension: Secondary | ICD-10-CM

## 2023-11-14 DIAGNOSIS — I495 Sick sinus syndrome: Secondary | ICD-10-CM | POA: Insufficient documentation

## 2023-11-14 DIAGNOSIS — Z8679 Personal history of other diseases of the circulatory system: Secondary | ICD-10-CM | POA: Insufficient documentation

## 2023-11-14 LAB — POCT GLYCOSYLATED HEMOGLOBIN (HGB A1C): Hemoglobin A1C: 6.1 % — AB (ref 4.0–5.6)

## 2023-11-14 MED ORDER — AMBULATORY NON FORMULARY MEDICATION: 1 refills | Status: AC

## 2023-11-14 NOTE — Assessment & Plan Note (Addendum)
 A1c actually looks great today with an A1c of 6.1 she is actually done well even though she admits she has not been the best with her diet lately.  So just encouraged her to make sure that she gets back on track.  Weight excess sugar and carbs.  She is currently diet controlled and not on medications.  She says she is no longer taking her statin and does not plan on taking her statin.  He really does not need any labs today but when she returns him always more than happy to check a magnesium and do a B12  she would like.

## 2023-11-14 NOTE — Assessment & Plan Note (Signed)
 Lan to recheck iron at next office visit.

## 2023-11-14 NOTE — Progress Notes (Signed)
 Established Patient Office Visit  Subjective  Patient ID: [name], female    DOB: 13-Jun-1946  Age: 78 y.o. MRN: [id number]  Chief Complaint  Patient presents with   ifg    HPI Impaired fasting glucose-no increased thirst or urination. No symptoms consistent with hypoglycemia.  Hypertension- Pt denies chest pain, SOB, dizziness, or heart palpitations.  Taking meds as directed w/o problems.  Denies medication side effects.    [name] is here with Samantha today and is worried about [name]'s magnesium being low. Dtr wants to know if she can take curcumin for inflammation.    She says she quit taking Samantha statin quite sometime ago after she passed a kidney stone and says she never plans on taking another statin.  She denies any lower extremity swelling.  Last serum creatinine was 1.5 on March 12 with a GFR of 35 which is stable for Samantha.    ROS    Objective:     BP (!) 128/49   Pulse 74   Ht 5\' 2"  (1.575 m)   Wt 198 lb (89.8 kg)   SpO2 95%   BMI 36.21 kg/m    Physical Exam Vitals and nursing note reviewed.  Constitutional:      Appearance: Normal appearance.  HENT:     Head: Normocephalic and atraumatic.  Eyes:     Conjunctiva/sclera: Conjunctivae normal.  Cardiovascular:     Rate and Rhythm: Normal rate and regular rhythm.  Pulmonary:     Effort: Pulmonary effort is normal.     Breath sounds: Normal breath sounds.  Skin:    General: Skin is warm and dry.  Neurological:     Mental Status: She is alert.  Psychiatric:        Mood and Affect: Mood normal.      Results for orders placed or performed in visit on 11/14/23  POCT HgB A1C  Result Value Ref Range   Hemoglobin A1C 6.1 (A) 4.0 - 5.6 %   HbA1c POC (<> result, manual entry)     HbA1c, POC (prediabetic range)     HbA1c, POC (controlled diabetic range)        The 10-year ASCVD risk score (Arnett DK, et al., 2019) is: 46.4%    Assessment & Plan:   Problem List Items Addressed This Visit        Cardiovascular and Mediastinum   Hx of sick sinus syndrome   Currently has a pacemaker and follows with cardiology       Essential hypertension   Well controlled. Continue current regimen. Follow up in 4 months.          Endocrine   Diabetes mellitus type 2 with complications (HCC) - Primary   A1c actually looks great today with an A1c of 6.1 she is actually done well even though she admits she has not been the best with Samantha diet lately.  So just encouraged Samantha to make sure that she gets back on track.  Weight excess sugar and carbs.  She is currently diet controlled and not on medications.  She says she is no longer taking Samantha statin and does not plan on taking Samantha statin.  He really does not need any labs today but when she returns him always more than happy to check a magnesium and do a B12  she would like.      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        Other   Iron deficiency   Lan to recheck iron at next office visit.      Hyperlipidemia   She says she stopped Samantha statin after she had a kidney stone and says she doesn't want to ever take one again.        Other Visit Diagnoses       Weakness of both lower extremities       Relevant Medications   AMBULATORY NON FORMULARY MEDICATION      Wanted me to reprint the order that we gave Samantha last year for a rolling walker.  Return in about 4 months (around 03/15/2024).    Duaine German, MD

## 2023-11-14 NOTE — Assessment & Plan Note (Signed)
 Due to recheck renal function in July.

## 2023-11-14 NOTE — Assessment & Plan Note (Signed)
Well controlled. Continue current regimen. Follow up in  4 months.   

## 2023-11-14 NOTE — Assessment & Plan Note (Signed)
 Currently has a pacemaker and follows with cardiology

## 2023-11-14 NOTE — Assessment & Plan Note (Signed)
 She says she stopped her statin after she had a kidney stone and says she doesn't want to ever take one again.

## 2023-12-28 ENCOUNTER — Other Ambulatory Visit: Payer: Self-pay | Admitting: Family Medicine

## 2023-12-28 DIAGNOSIS — G629 Polyneuropathy, unspecified: Secondary | ICD-10-CM

## 2023-12-28 DIAGNOSIS — G2581 Restless legs syndrome: Secondary | ICD-10-CM

## 2024-01-02 ENCOUNTER — Other Ambulatory Visit: Payer: Self-pay | Admitting: Family Medicine

## 2024-01-02 DIAGNOSIS — Z950 Presence of cardiac pacemaker: Secondary | ICD-10-CM | POA: Diagnosis not present

## 2024-01-02 DIAGNOSIS — Z955 Presence of coronary angioplasty implant and graft: Secondary | ICD-10-CM | POA: Diagnosis not present

## 2024-01-02 DIAGNOSIS — I495 Sick sinus syndrome: Secondary | ICD-10-CM | POA: Diagnosis not present

## 2024-01-02 DIAGNOSIS — Z1330 Encounter for screening examination for mental health and behavioral disorders, unspecified: Secondary | ICD-10-CM | POA: Diagnosis not present

## 2024-01-02 DIAGNOSIS — I2510 Atherosclerotic heart disease of native coronary artery without angina pectoris: Secondary | ICD-10-CM | POA: Diagnosis not present

## 2024-01-02 DIAGNOSIS — I442 Atrioventricular block, complete: Secondary | ICD-10-CM | POA: Diagnosis not present

## 2024-01-02 DIAGNOSIS — I10 Essential (primary) hypertension: Secondary | ICD-10-CM | POA: Diagnosis not present

## 2024-01-11 ENCOUNTER — Telehealth: Payer: Self-pay

## 2024-01-11 ENCOUNTER — Other Ambulatory Visit: Payer: Self-pay | Admitting: Family Medicine

## 2024-01-11 ENCOUNTER — Ambulatory Visit: Payer: Self-pay | Admitting: *Deleted

## 2024-01-11 ENCOUNTER — Ambulatory Visit: Payer: Self-pay

## 2024-01-11 MED ORDER — MECLIZINE HCL 25 MG PO TABS: 25.0000 mg | ORAL_TABLET | Freq: Three times a day (TID) | ORAL | 2 refills | Status: AC | PRN

## 2024-01-11 NOTE — Telephone Encounter (Signed)
 Copied from CRM [id number]. Topic: Clinical - Medication Question >> Jan 11, 2024  8:20 AM [name] wrote: Reason for CRM: Patient called stating that she's having vertigo issues again. She wants to know if Dr. Greer Leak can prescribe another prescription for Antivert . She states that's what she was given the last time. Please give her a call back at [phone] to advise. She would like for it to be sent to [facility] in [facility].

## 2024-01-11 NOTE — Telephone Encounter (Signed)
 Copied from CRM [id number]. Topic: Clinical - Medication Question >> Jan 11, 2024 12:54 PM Kevelyn M wrote: Reason for CRM: Patient called in about prescription for vertigo and asking how long it would be before she's able to pick it up at the pharmacy.

## 2024-01-11 NOTE — Telephone Encounter (Signed)
 FYI Only or Action Required?: Action required by provider  Patient was last seen in primary care on 11/14/2023 by Cydney Draft, MD. Called Nurse Triage reporting Dizziness. Symptoms began yesterday. Interventions attempted: Rest, hydration, or home remedies. Symptoms are: unchanged.  Triage Disposition: See PCP When Office is Open (Within 3 Days)  Patient/caregiver understands and will follow disposition?: No, wishes to speak with PCP                  Copied from CRM [id number]. Topic: Clinical - Red Word Triage >> Jan 11, 2024  8:17 AM [name] wrote: Red Word that prompted transfer to Nurse Triage: Patient states she is having vertigo issues again & it's to the point where she can't hardly stand. Wants to see if Dr. Greer Leak will send a prescription for Adavert over to [facility] in [facility]? Reason for Disposition  [1] MODERATE dizziness (e.g., vertigo; feels very unsteady, interferes with normal activities) AND [2] has been evaluated by doctor (or NP/PA) for this  Answer Assessment - Initial Assessment Questions 1. DESCRIPTION: Describe your dizziness.     Very dizzy 2. VERTIGO: Do you feel like either you or the room is spinning or tilting?      yes 3. LIGHTHEADED: Do you feel lightheaded? (e.g., somewhat faint, woozy, weak upon standing)     woozy 4. SEVERITY: How bad is it?  Can you walk?   - MILD: Feels slightly dizzy and unsteady, but is walking normally.   - MODERATE: Feels unsteady when walking, but not falling; interferes with normal activities (e.g., school, work).   - SEVERE: Unable to walk without falling, or requires assistance to walk without falling.     moderate 5. ONSET:  When did the dizziness begin?     2 days 6. AGGRAVATING FACTORS: Does anything make it worse? (e.g., standing, change in head position)     Bending and changing positions 7. CAUSE: What do you think is causing the dizziness?     vertigo 8. RECURRENT SYMPTOM:  Have you had dizziness before? If Yes, ask: When was the last time? What happened that time?     Yes, 2 years 9. OTHER SYMPTOMS: Do you have any other symptoms? (e.g., headache, weakness, numbness, vomiting, earache)     no  Protocols used: Dizziness - Vertigo-A-AH

## 2024-01-11 NOTE — Telephone Encounter (Signed)
 Please see other notes about the vertigo I did send in the medication for that in regards to the shortness of breath which is a totally different issue she does need to be seen I do not know if we have any acute's tomorrow or she can go to urgent care.

## 2024-01-11 NOTE — Telephone Encounter (Signed)
 Please review the other TE in the patient's chart.

## 2024-01-11 NOTE — Telephone Encounter (Signed)
 FYI

## 2024-01-11 NOTE — Telephone Encounter (Signed)
 Nurse called the following number listed on pt chart: [phone], a lady answered the phone then nurse introduced self & explained returning call that had become disconnected: asked person  to verify name & DOB: person refused to provide information b/c she was unsure of why [name] was calling & again would not verify information therefore call ended.  Will route information to PCP office as fyi

## 2024-01-11 NOTE — Telephone Encounter (Signed)
 Copied from CRM [id number]. Topic: Clinical - Red Word Triage >> Jan 11, 2024  2:07 PM Retta Caster wrote: Red Word that prompted transfer to Nurse Triage:   Shortness of breath/Hurts in lung area as of 06/11  Agent stated he was transferring pt's daughter but no one was on the line.   Nurse will make attempt to reach back out.

## 2024-01-11 NOTE — Telephone Encounter (Signed)
 Copied from CRM [id number]. Topic: Clinical - Red Word Triage >> Jan 11, 2024  2:13 PM Tisa Forester wrote: Red Word that prompted transfer to Nurse Triage: dizzy all day  had vertigo before /  Shortness of breath/Hurts in lung now  callback number [phone] Reason for Disposition  [1] MILD difficulty breathing (e.g., minimal/no SOB at rest, SOB with walking, pulse <100) AND [2] NEW-onset or WORSE than normal  Answer Assessment - Initial Assessment Questions 1. RESPIRATORY STATUS: Describe your breathing? (e.g., wheezing, shortness of breath, unable to speak, severe coughing)      Daughter calling in.  Sarah. She is having shortness of breath and hurting in her lung.   She was having vertigo earlier.   She is really dizzy.   I requested medication for vertigo since she has this before.    I would like her to be seen to r/o pneumonia.    My Mom called me a few minutes ago and told me she is having shortness of breath and hurting in her lung.    I'm requesting an appt for her to be seen.   Is there a possibility  for her to be seen by Dr. Greer Leak.    2. ONSET: When did this breathing problem begin?      This morning/today 3. PATTERN Does the difficult breathing come and go, or has it been constant since it started?      Constant 4. SEVERITY: How bad is your breathing? (e.g., mild, moderate, severe)    - MILD: No SOB at rest, mild SOB with walking, speaks normally in sentences, can lie down, no retractions, pulse < 100.    - MODERATE: SOB at rest, SOB with minimal exertion and prefers to sit, cannot lie down flat, speaks in phrases, mild retractions, audible wheezing, pulse 100-120.    - SEVERE: Very SOB at rest, speaks in single words, struggling to breathe, sitting hunched forward, retractions, pulse > 120      She has excess mucus for a while.    She has a runny nose for a while now. 5. RECURRENT SYMPTOM: Have you had difficulty breathing before? If Yes, ask: When was the last time? and  What happened that time?      She is hurting in her lower back and her lung hurts too. 6. CARDIAC HISTORY: Do you have any history of heart disease? (e.g., heart attack, angina, bypass surgery, angioplasty)      She has heart problems and history of vertigo too. 7. LUNG HISTORY: Do you have any history of lung disease?  (e.g., pulmonary embolus, asthma, emphysema)     Pneumonia history 8. CAUSE: What do you think is causing the breathing problem?      Hoping she does not have pneumonia.    9. OTHER SYMPTOMS: Do you have any other symptoms? (e.g., dizziness, runny nose, cough, chest pain, fever)     Vertigo, shortness of breath, hurting in her back. 10. O2 SATURATION MONITOR:  Do you use an oxygen saturation monitor (pulse oximeter) at home? If Yes, ask: What is your reading (oxygen level) today? What is your usual oxygen saturation reading? (e.g., 95%)       Not asked 11. PREGNANCY: Is there any chance you are pregnant? When was your last menstrual period?       N/A due to age 78. TRAVEL: Have you traveled out of the country in the last month? (e.g., travel history, exposures)       N/A  Protocols used: Breathing Difficulty-A-AH FYI Only or Action Required?: FYI only for provider  Patient was last seen in primary care on 11/14/2023 by Cydney Draft, MD. Called Nurse Triage reporting Shortness of Breath. Symptoms began today. Interventions attempted: Nothing. Symptoms are: gradually worsening.  Triage Disposition: See HCP Within 4 Hours (Or PCP Triage)  Yes  Patient/caregiver understands and will follow disposition?: FYI Only or Action Required?: FYI only for provider  Patient was last seen in primary care on 11/14/2023 by Cydney Draft, MD. Called Nurse Triage reporting Shortness of Breath. Symptoms began today. Interventions attempted: Nothing. Symptoms are: gradually worsening.  Triage Disposition: See HCP Within 4 Hours (Or PCP  Triage)  Patient/caregiver understands and will follow disposition?:  Yes

## 2024-01-11 NOTE — Telephone Encounter (Signed)
 Rx not listed in active med list. New rx request. Please advise, thanks.

## 2024-01-12 NOTE — Telephone Encounter (Signed)
 Patient informed.

## 2024-01-12 NOTE — Telephone Encounter (Signed)
 Appt tomorrow 01/13/2024 with Dr. Augustus Ledger to address the SOB.

## 2024-01-13 ENCOUNTER — Ambulatory Visit: Payer: Self-pay | Admitting: Family Medicine

## 2024-01-13 ENCOUNTER — Ambulatory Visit

## 2024-01-13 ENCOUNTER — Encounter: Payer: Self-pay | Admitting: Family Medicine

## 2024-01-13 ENCOUNTER — Ambulatory Visit (INDEPENDENT_AMBULATORY_CARE_PROVIDER_SITE_OTHER): Admitting: Family Medicine

## 2024-01-13 VITALS — BP 115/69 | HR 65 | Ht 62.0 in | Wt 198.0 lb

## 2024-01-13 DIAGNOSIS — R0989 Other specified symptoms and signs involving the circulatory and respiratory systems: Secondary | ICD-10-CM | POA: Diagnosis not present

## 2024-01-13 DIAGNOSIS — Z6836 Body mass index (BMI) 36.0-36.9, adult: Secondary | ICD-10-CM | POA: Diagnosis not present

## 2024-01-13 DIAGNOSIS — R0781 Pleurodynia: Secondary | ICD-10-CM | POA: Insufficient documentation

## 2024-01-13 DIAGNOSIS — I2510 Atherosclerotic heart disease of native coronary artery without angina pectoris: Secondary | ICD-10-CM | POA: Diagnosis not present

## 2024-01-13 DIAGNOSIS — G629 Polyneuropathy, unspecified: Secondary | ICD-10-CM

## 2024-01-13 DIAGNOSIS — I200 Unstable angina: Secondary | ICD-10-CM | POA: Diagnosis not present

## 2024-01-13 DIAGNOSIS — I080 Rheumatic disorders of both mitral and aortic valves: Secondary | ICD-10-CM | POA: Diagnosis not present

## 2024-01-13 DIAGNOSIS — R9431 Abnormal electrocardiogram [ECG] [EKG]: Secondary | ICD-10-CM | POA: Diagnosis not present

## 2024-01-13 DIAGNOSIS — I25110 Atherosclerotic heart disease of native coronary artery with unstable angina pectoris: Secondary | ICD-10-CM | POA: Diagnosis not present

## 2024-01-13 DIAGNOSIS — Z950 Presence of cardiac pacemaker: Secondary | ICD-10-CM | POA: Diagnosis not present

## 2024-01-13 DIAGNOSIS — R0600 Dyspnea, unspecified: Secondary | ICD-10-CM | POA: Diagnosis not present

## 2024-01-13 DIAGNOSIS — R42 Dizziness and giddiness: Secondary | ICD-10-CM | POA: Diagnosis not present

## 2024-01-13 DIAGNOSIS — I498 Other specified cardiac arrhythmias: Secondary | ICD-10-CM | POA: Diagnosis not present

## 2024-01-13 DIAGNOSIS — J9600 Acute respiratory failure, unspecified whether with hypoxia or hypercapnia: Secondary | ICD-10-CM | POA: Diagnosis not present

## 2024-01-13 DIAGNOSIS — Z45010 Encounter for checking and testing of cardiac pacemaker pulse generator [battery]: Secondary | ICD-10-CM | POA: Diagnosis not present

## 2024-01-13 DIAGNOSIS — Z452 Encounter for adjustment and management of vascular access device: Secondary | ICD-10-CM | POA: Diagnosis not present

## 2024-01-13 DIAGNOSIS — N179 Acute kidney failure, unspecified: Secondary | ICD-10-CM | POA: Diagnosis not present

## 2024-01-13 DIAGNOSIS — Z66 Do not resuscitate: Secondary | ICD-10-CM | POA: Diagnosis not present

## 2024-01-13 DIAGNOSIS — E669 Obesity, unspecified: Secondary | ICD-10-CM | POA: Diagnosis not present

## 2024-01-13 DIAGNOSIS — R079 Chest pain, unspecified: Secondary | ICD-10-CM | POA: Diagnosis not present

## 2024-01-13 DIAGNOSIS — R0789 Other chest pain: Secondary | ICD-10-CM | POA: Diagnosis not present

## 2024-01-13 DIAGNOSIS — I442 Atrioventricular block, complete: Secondary | ICD-10-CM | POA: Diagnosis not present

## 2024-01-13 DIAGNOSIS — R7989 Other specified abnormal findings of blood chemistry: Secondary | ICD-10-CM | POA: Diagnosis not present

## 2024-01-13 DIAGNOSIS — R9389 Abnormal findings on diagnostic imaging of other specified body structures: Secondary | ICD-10-CM | POA: Diagnosis not present

## 2024-01-13 DIAGNOSIS — R7303 Prediabetes: Secondary | ICD-10-CM | POA: Diagnosis not present

## 2024-01-13 DIAGNOSIS — Z6835 Body mass index (BMI) 35.0-35.9, adult: Secondary | ICD-10-CM | POA: Diagnosis not present

## 2024-01-13 DIAGNOSIS — R0602 Shortness of breath: Secondary | ICD-10-CM | POA: Diagnosis not present

## 2024-01-13 DIAGNOSIS — Z8669 Personal history of other diseases of the nervous system and sense organs: Secondary | ICD-10-CM | POA: Diagnosis not present

## 2024-01-13 DIAGNOSIS — N1832 Chronic kidney disease, stage 3b: Secondary | ICD-10-CM | POA: Diagnosis not present

## 2024-01-13 DIAGNOSIS — I10 Essential (primary) hypertension: Secondary | ICD-10-CM | POA: Diagnosis not present

## 2024-01-13 DIAGNOSIS — I130 Hypertensive heart and chronic kidney disease with heart failure and stage 1 through stage 4 chronic kidney disease, or unspecified chronic kidney disease: Secondary | ICD-10-CM | POA: Diagnosis not present

## 2024-01-13 LAB — COMPREHENSIVE METABOLIC PANEL WITH GFR
ALT: 33 IU/L — ABNORMAL HIGH (ref 0–32)
AST: 17 IU/L (ref 0–40)
Albumin: 3.9 g/dL (ref 3.8–4.8)
Alkaline Phosphatase: 28 IU/L — ABNORMAL LOW (ref 44–121)
BUN/Creatinine Ratio: 18 (ref 12–28)
BUN: 33 mg/dL — ABNORMAL HIGH (ref 8–27)
Bilirubin Total: 0.3 mg/dL (ref 0.0–1.2)
CO2: 21 mmol/L (ref 20–29)
Calcium: 8.8 mg/dL (ref 8.7–10.3)
Chloride: 110 mmol/L — ABNORMAL HIGH (ref 96–106)
Creatinine, Ser: 1.84 mg/dL — ABNORMAL HIGH (ref 0.57–1.00)
Globulin, Total: 1.9 g/dL (ref 1.5–4.5)
Glucose: 114 mg/dL — ABNORMAL HIGH (ref 70–99)
Potassium: 5 mmol/L (ref 3.5–5.2)
Sodium: 142 mmol/L (ref 134–144)
Total Protein: 5.8 g/dL — ABNORMAL LOW (ref 6.0–8.5)
eGFR: 28 mL/min/{1.73_m2} — ABNORMAL LOW (ref >59)

## 2024-01-13 LAB — CBC WITH DIFFERENTIAL/PLATELET
Basophils Absolute: 0 10*3/uL (ref 0.0–0.2)
Basos: 0 %
EOS (ABSOLUTE): 0.2 10*3/uL (ref 0.0–0.4)
Eos: 3 %
Hematocrit: 37.9 % (ref 34.0–46.6)
Hemoglobin: 12.3 g/dL (ref 11.1–15.9)
Immature Granulocytes: 0 %
Lymphocytes Absolute: 1.4 10*3/uL (ref 0.7–3.1)
Lymphs: 19 %
MCH: 31.5 pg (ref 26.6–33.0)
MCHC: 32.5 g/dL (ref 31.5–35.7)
MCV: 97 fL (ref 79–97)
Monocytes Absolute: 0.5 10*3/uL (ref 0.1–0.9)
Monocytes: 6 %
Neutrophils Absolute: 5.5 10*3/uL (ref 1.4–7.0)
Neutrophils: 72 %
Platelets: 265 10*3/uL (ref 150–450)
RBC: 3.91 x10E6/uL (ref 3.77–5.28)
RDW: 14.2 % (ref 11.7–15.4)
WBC: 7.6 10*3/uL (ref 3.4–10.8)

## 2024-01-13 LAB — LAB REPORT - SCANNED: EGFR: 28

## 2024-01-13 LAB — D-DIMER, QUANTITATIVE: D-DIMER: 1.24 mg{FEU}/L — ABNORMAL HIGH (ref 0.00–0.49)

## 2024-01-13 NOTE — Assessment & Plan Note (Signed)
 Has gabapentin  prn.  History of diabetes.  Will look at vitamin B1, folate and b12 levels as well as thyroid  function.  Can consider addition of ALA as well.

## 2024-01-13 NOTE — Assessment & Plan Note (Signed)
 She may continue meclizine  prn.  Declines referral to vestibular therapy.

## 2024-01-13 NOTE — Telephone Encounter (Signed)
 Contacted pt concerning lab results and ER evaluation for possible PE from Dr. Augustus Ledger. Was given a new phone number from Ms. Myah for contacting daughter Rueben Cote.   Attempted to contact Sarah x 2. No answer. No voicemail. Ms. Burkard does not have transportation. Attempted to contact grandson. No answer. LVM requesting he contact his grandmother.

## 2024-01-13 NOTE — Assessment & Plan Note (Signed)
 Chest pain seem more pleuritic in nature.  2 view CXR ordered.  Stat D-dimer ordered.

## 2024-01-13 NOTE — Progress Notes (Signed)
 [name] - 78 y.o. female MRN [id number]  Date of birth: 1946-01-20  Subjective Chief Complaint  Patient presents with   Chest Pain   Nasal Congestion   Dizziness    HPI [name] is a 78 y.o. female here today with dyspnea and pain on the L chest and back.  She reports that symptoms started about 2 weeks ago. She has had cough as well productive of mucus.  She denies fever or chills associated with this.  She does feel that this is similar to when she had pneumonia in the past.  She has never had PE.  She does have history of SSS and is s/p pacemaker.  She does see cardiology regularly and is due for generator change.  After much thought she has decided not to pursue having this done.  She states that her cardiologist is not too happy with her and she understands the consequences of not having this done.   Her cardiologist is seeing her again in 1 month and will discuss with her again.    Additionally she has had some vertigo.  This is a chronic issue that comes and goes.  She is using meclizine  prn for this.    She also has some neuropathy in her feet.  She does have diabetes and this is well controlled.    ROS:  A comprehensive ROS was completed and negative except as noted per HPI  Allergies  Allergen Reactions   Morphine      Delusions   Brilinta [Ticagrelor] Other (See Comments)    Past Medical History:  Diagnosis Date   Arthritis    qwhere (06/06/2017)   Chronic bilateral low back pain    CKD (chronic kidney disease) stage 3, GFR 30-59 ml/min (HCC) 09/21/2012   Complication of anesthesia    slow to wake up once (06/06/2017)   DDD (degenerative disc disease), cervical    Dysrhythmia    Esophageal stricture    GERD 10/04/2007   Qualifier: Diagnosis of  By: Greer Leak MD, Catherine     HIATAL HERNIA WITH REFLUX 10/04/2007   Qualifier: Diagnosis of  By: Greer Leak MD, Catherine     History of hiatal hernia    History of kidney stones    Hyperlipidemia 08/10/2010    Qualifier: Diagnosis of  By: Greer Leak MD, Catherine     Hypertension    HYPERTENSION, BENIGN ESSENTIAL 04/27/2007   Qualifier: Diagnosis of  By: Greer Leak MD, Catherine     Impaired fasting glucose 08/10/2010   Qualifier: Diagnosis of  By: Greer Leak MD, Catherine     Lobar pneumonia [facility])    never missed a day of work w/it   Other specified cardiac arrhythmias 01/02/2015   Presence of permanent cardiac pacemaker    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 Zann Tyson, [facility]    Seborrheic dermatitis 06/12/2014    Past Surgical History:  Procedure Laterality Date   ABDOMINAL HYSTERECTOMY     BACK SURGERY     BLADDER SUSPENSION     CARPAL TUNNEL RELEASE Bilateral    CYSTOSCOPY W/ STONE MANIPULATION     DILATION AND CURETTAGE OF UTERUS     ESOPHAGOGASTRODUODENOSCOPY (EGD) WITH ESOPHAGEAL DILATION  X 1   GUM SURGERY     ramus frame implant lower gum   INSERT / REPLACE / REMOVE PACEMAKER  08/2012   JOINT REPLACEMENT     LUMBAR DISC SURGERY  X 2   TARSAL TUNNEL  RELEASE Bilateral    TONSILLECTOMY     TOTAL HIP ARTHROPLASTY Left 06/06/2017   TOTAL HIP ARTHROPLASTY Left 06/06/2017   Procedure: TOTAL HIP ARTHROPLASTY ANTERIOR APPROACH;  Surgeon: Wendolyn Hamburger, MD;  Location: [facility] OR;  Service: Orthopedics;  Laterality: Left;   TOTAL KNEE ARTHROPLASTY Left 2006   TOTAL KNEE ARTHROPLASTY Right 11/30/2017   Procedure: TOTAL KNEE ARTHROPLASTY;  Surgeon: Wendolyn Hamburger, MD;  Location: [facility] OR;  Service: Orthopedics;  Laterality: Right;   TOTAL KNEE REVISION Left 2007   new hardware  due to staph infection, on chronic keflex    TRANSTHORACIC ECHOCARDIOGRAM     08/14/12 [facility]): NL LVF, no segmental abnormality, borderline LVH, mild MR, mild MR, trace TR    Social History   Socioeconomic History   Marital status: Widowed    Spouse name: Not on file   Number of children: 2   Years of education: 16   Highest education level:  Bachelor's degree (e.g., BA, AB, BS)  Occupational History   Occupation: Retired  Tobacco Use   Smoking status: Never   Smokeless tobacco: Never  Vaping Use   Vaping status: Never Used  Substance and Sexual Activity   Alcohol use: No   Drug use: No   Sexual activity: Not Currently  Other Topics Concern   Not on file  Social History Narrative   Lives alone. She has two children; one has deceased. Her daughter lives in [place]. She enjoys reading, adult coloring, being outside and crafts.   Social Drivers of Corporate investment banker Strain: Low Risk  (01/02/2024)   Received from [facility]   Overall Financial Resource Strain (CARDIA)    Difficulty of Paying Living Expenses: Not hard at all  Food Insecurity: No Food Insecurity (01/02/2024)   [facility]   Hunger Vital Sign    Within the past 12 months, you worried that your food would run out before you got the money to buy more.: Never true    Within the past 12 months, the food you bought just didn't last and you didn't have money to get more.: Never true  Transportation Needs: No Transportation Needs (01/02/2024)   Received from [facility] - Transportation    Lack of Transportation (Medical): No    Lack of Transportation (Non-Medical): No  Physical Activity: Inactive (07/11/2023)   Exercise Vital Sign    Days of Exercise per Week: 0 days    Minutes of Exercise per Session: 0 min  Stress: No Stress Concern Present (07/11/2023)   [employer] of Occupational Health - Occupational Stress Questionnaire    Feeling of Stress : Not at all  Social Connections: Socially Isolated (07/11/2023)   Social Connection and Isolation Panel    Frequency of Communication with Friends and Family: Twice a week    Frequency of Social Gatherings with Friends and Family: Once a week    Attends Religious Services: Never    Database administrator or Organizations: No    Attends Banker Meetings:  Never    Marital Status: Widowed    Family History  Problem Relation Age of Onset   Heart attack Mother    Diabetes Mother 87   Hypertension Mother    Hypertension Father     Health Maintenance  Topic Date Due   OPHTHALMOLOGY EXAM  Never done   Zoster Vaccines- Shingrix (1 of 2) Never done   DEXA SCAN  Never done  COVID-19 Vaccine (1 - 2024-25 season) Never done   Diabetic kidney evaluation - eGFR measurement  08/14/2023   Diabetic kidney evaluation - Urine ACR  08/14/2023   FOOT EXAM  12/23/2023   INFLUENZA VACCINE  03/02/2024   HEMOGLOBIN A1C  05/15/2024   DTaP/Tdap/Td (3 - Td or Tdap) 05/23/2024   Medicare Annual Wellness (AWV)  07/10/2024   Pneumococcal Vaccine: 50+ Years  Completed   Hepatitis C Screening  Completed   HPV VACCINES  Aged Out   Meningococcal B Vaccine  Aged Out   Colonoscopy  Discontinued     ----------------------------------------------------------------------------------------------------------------------------------------------------------------------------------------------------------------- Physical Exam BP 115/69 (BP Location: Left Arm, Patient Position: Sitting, Cuff Size: Large)   Pulse 65   Ht 5' 2 (1.575 m)   Wt 198 lb (89.8 kg)   SpO2 96%   BMI 36.21 kg/m   Physical Exam Constitutional:      Appearance: She is well-developed.  HENT:     Head: Normocephalic and atraumatic.   Eyes:     General: No scleral icterus.   Cardiovascular:     Rate and Rhythm: Normal rate and regular rhythm.  Pulmonary:     Breath sounds: Normal breath sounds.     Comments: Unable to fully take a deep breath without discomfort.   Musculoskeletal:     Cervical back: Neck supple.   Neurological:     Mental Status: She is alert.   Psychiatric:        Mood and Affect: Mood normal.        Behavior: Behavior normal.      ------------------------------------------------------------------------------------------------------------------------------------------------------------------------------------------------------------------- Assessment and Plan  Neuropathy Has gabapentin  prn.  History of diabetes.  Will look at vitamin B1, folate and b12 levels as well as thyroid  function.  Can consider addition of ALA as well.   Pleuritic chest pain Chest pain seem more pleuritic in nature.  2 view CXR ordered.  Stat D-dimer ordered.    Vertigo She may continue meclizine  prn.  Declines referral to vestibular therapy.    No orders of the defined types were placed in this encounter.   No follow-ups on file.

## 2024-01-13 NOTE — Telephone Encounter (Signed)
 Spoke with patient - her grandson is on the way to pick her up to take her to ER

## 2024-01-13 NOTE — Addendum Note (Signed)
 Addended by: Izora Marten on: 01/13/2024 12:39 PM   Modules accepted: Orders

## 2024-01-16 LAB — B12 AND FOLATE PANEL
Folate: 13.2 ng/mL (ref >3.0)
Vitamin B-12: 292 pg/mL (ref 232–1245)

## 2024-01-16 LAB — TSH+FREE T4
Free T4: 0.97 ng/dL (ref 0.82–1.77)
TSH: 1.05 u[IU]/mL (ref 0.450–4.500)

## 2024-01-16 LAB — SPECIMEN STATUS REPORT

## 2024-01-16 LAB — VITAMIN B1

## 2024-01-18 ENCOUNTER — Telehealth: Payer: Self-pay

## 2024-01-18 NOTE — Transitions of Care (Post Inpatient/ED Visit) (Signed)
   01/18/2024  Name: KEYRY IRACHETA MRN: [id number] DOB: Dec 02, 1945  Today's TOC FU Call Status: Today's TOC FU Call Status:: Unsuccessful Call (1st Attempt) Unsuccessful Call (1st Attempt) Date: 01/18/24  Attempted to reach the patient regarding the most recent Inpatient/ED visit.  Follow Up Plan: Additional outreach attempts will be made to reach the patient to complete the Transitions of Care (Post Inpatient/ED visit) call.   Tonia Frankel RN, CCM [place]  VBCI-Population Health RN Care Manager [phone]

## 2024-01-19 ENCOUNTER — Telehealth: Payer: Self-pay

## 2024-01-19 NOTE — Transitions of Care (Post Inpatient/ED Visit) (Signed)
   01/19/2024  Name: [name] MRN: [id number] DOB: 08/14/1945  Today's TOC FU Call Status: Today's TOC FU Call Status:: Unsuccessful Call (2nd Attempt) Unsuccessful Call (2nd Attempt) Date: 01/19/24  Attempted to reach the patient regarding the most recent Inpatient/ED visit.  Follow Up Plan: Additional outreach attempts will be made to reach the patient to complete the Transitions of Care (Post Inpatient/ED visit) call.   Tonia Frankel RN, CCM [place]  VBCI-Population Health RN Care Manager [phone]

## 2024-01-20 ENCOUNTER — Telehealth: Payer: Self-pay

## 2024-01-20 NOTE — Transitions of Care (Post Inpatient/ED Visit) (Signed)
   01/20/2024  Name: [name] MRN: [id number] DOB: 05-Jul-1946  Today's TOC FU Call Status: Today's TOC FU Call Status:: Unsuccessful Call (3rd Attempt) Unsuccessful Call (3rd Attempt) Date: 01/20/24  Attempted to reach the patient regarding the most recent Inpatient/ED visit.  Follow Up Plan: No further outreach attempts will be made at this time. We have been unable to contact the patient. Tonia Frankel RN, CCM [place]  VBCI-Population Health RN Care Manager [phone]

## 2024-01-30 DIAGNOSIS — I10 Essential (primary) hypertension: Secondary | ICD-10-CM | POA: Diagnosis not present

## 2024-01-30 DIAGNOSIS — M7989 Other specified soft tissue disorders: Secondary | ICD-10-CM | POA: Diagnosis not present

## 2024-01-30 DIAGNOSIS — M79672 Pain in left foot: Secondary | ICD-10-CM | POA: Diagnosis not present

## 2024-01-30 DIAGNOSIS — R52 Pain, unspecified: Secondary | ICD-10-CM | POA: Diagnosis not present

## 2024-01-30 DIAGNOSIS — M11272 Other chondrocalcinosis, left ankle and foot: Secondary | ICD-10-CM | POA: Diagnosis not present

## 2024-02-06 DIAGNOSIS — I10 Essential (primary) hypertension: Secondary | ICD-10-CM | POA: Diagnosis not present

## 2024-02-06 DIAGNOSIS — M10472 Other secondary gout, left ankle and foot: Secondary | ICD-10-CM | POA: Diagnosis not present

## 2024-02-06 DIAGNOSIS — M7989 Other specified soft tissue disorders: Secondary | ICD-10-CM | POA: Diagnosis not present

## 2024-03-19 DIAGNOSIS — Z955 Presence of coronary angioplasty implant and graft: Secondary | ICD-10-CM | POA: Diagnosis not present

## 2024-03-19 DIAGNOSIS — I442 Atrioventricular block, complete: Secondary | ICD-10-CM | POA: Diagnosis not present

## 2024-03-19 DIAGNOSIS — Z950 Presence of cardiac pacemaker: Secondary | ICD-10-CM | POA: Diagnosis not present

## 2024-03-19 DIAGNOSIS — I2510 Atherosclerotic heart disease of native coronary artery without angina pectoris: Secondary | ICD-10-CM | POA: Diagnosis not present

## 2024-03-19 DIAGNOSIS — I10 Essential (primary) hypertension: Secondary | ICD-10-CM | POA: Diagnosis not present

## 2024-03-19 DIAGNOSIS — I495 Sick sinus syndrome: Secondary | ICD-10-CM | POA: Diagnosis not present

## 2024-04-03 ENCOUNTER — Other Ambulatory Visit: Payer: Self-pay | Admitting: Family Medicine

## 2024-04-03 DIAGNOSIS — G2581 Restless legs syndrome: Secondary | ICD-10-CM

## 2024-04-03 DIAGNOSIS — G629 Polyneuropathy, unspecified: Secondary | ICD-10-CM

## 2024-04-05 ENCOUNTER — Other Ambulatory Visit: Payer: Self-pay

## 2024-04-05 DIAGNOSIS — G629 Polyneuropathy, unspecified: Secondary | ICD-10-CM

## 2024-04-05 DIAGNOSIS — G2581 Restless legs syndrome: Secondary | ICD-10-CM

## 2024-04-05 MED ORDER — GABAPENTIN 300 MG PO CAPS: 300.0000 mg | ORAL_CAPSULE | Freq: Every day | ORAL | 0 refills | Status: AC

## 2024-04-05 NOTE — Telephone Encounter (Signed)
 Requesting a rx rf of gabapentin  300mg   Last written 12/29/2023 Last OV 01/13/2024 Upcoming appt = none

## 2024-04-05 NOTE — Telephone Encounter (Signed)
 Copied from CRM #[id number]. Topic: Clinical - Prescription Issue >> Apr 05, 2024  2:02 PM Kevelyn M wrote: Reason for CRM: patient is calling about gabapentin  (NEURONTIN ) 300 MG capsule refill status. She stated the pharmacy said it would be ready but it hasn't been sent in yet. Please advise.

## 2024-04-06 NOTE — Telephone Encounter (Signed)
Attempted call to patient . Phone rang without answer. Could not leave a voice mail message.

## 2024-04-12 ENCOUNTER — Other Ambulatory Visit: Payer: Self-pay | Admitting: Family Medicine

## 2024-05-04 NOTE — Progress Notes (Signed)
 ADLAI NIEBLAS                                          MRN: [id number]   05/04/2024   The [facility] Quality Team Specialist reviewed this patient medical record for the purposes of chart review for care gap closure. The following were reviewed: chart review for care gap closure-kidney health evaluation for diabetes:eGFR  and uACR.    [facility] Quality Team

## 2024-05-18 DIAGNOSIS — M79671 Pain in right foot: Secondary | ICD-10-CM | POA: Diagnosis not present

## 2024-05-18 DIAGNOSIS — M79672 Pain in left foot: Secondary | ICD-10-CM | POA: Diagnosis not present

## 2024-05-18 DIAGNOSIS — M25572 Pain in left ankle and joints of left foot: Secondary | ICD-10-CM | POA: Diagnosis not present

## 2024-05-18 DIAGNOSIS — M25571 Pain in right ankle and joints of right foot: Secondary | ICD-10-CM | POA: Diagnosis not present

## 2024-05-31 ENCOUNTER — Ambulatory Visit: Admitting: Family Medicine

## 2024-07-02 ENCOUNTER — Ambulatory Visit: Admitting: Family Medicine

## 2024-09-03 ENCOUNTER — Ambulatory Visit: Admitting: Family Medicine

## 2024-09-05 DIAGNOSIS — I442 Atrioventricular block, complete: Secondary | ICD-10-CM | POA: Insufficient documentation

## 2024-09-05 NOTE — Progress Notes (Unsigned)
" ° °  Established Patient Office Visit  Patient ID: LAMYA LAUSCH, female    DOB: June 17, 1946  Age: 79 y.o. MRN: [id number] PCP: Alvan Dorothyann BIRCH, MD  No chief complaint on file.   Subjective:     HPI  Discussed the use of AI scribe software for clinical note transcription with the patient, who gave verbal consent to proceed.  History of Present Illness    {History (Optional):23778}  ROS    Objective:     There were no vitals taken for this visit. {Vitals History (Optional):23777}  Physical Exam  {PhysExam Abridge (Optional):210964309} No results found for any visits on 09/06/24.  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    Dorothyann Alvan, MD [facility] at [facility]   "

## 2024-09-06 ENCOUNTER — Ambulatory Visit: Admitting: Family Medicine

## 2024-09-06 ENCOUNTER — Encounter: Payer: Self-pay | Admitting: Family Medicine

## 2024-09-06 VITALS — BP 130/70 | HR 76 | Ht 62.0 in | Wt 205.0 lb

## 2024-09-06 DIAGNOSIS — I442 Atrioventricular block, complete: Secondary | ICD-10-CM

## 2024-09-06 DIAGNOSIS — I495 Sick sinus syndrome: Secondary | ICD-10-CM

## 2024-09-06 DIAGNOSIS — N1831 Chronic kidney disease, stage 3a: Secondary | ICD-10-CM

## 2024-09-06 DIAGNOSIS — K219 Gastro-esophageal reflux disease without esophagitis: Secondary | ICD-10-CM

## 2024-09-06 DIAGNOSIS — E538 Deficiency of other specified B group vitamins: Secondary | ICD-10-CM

## 2024-09-06 DIAGNOSIS — Z950 Presence of cardiac pacemaker: Secondary | ICD-10-CM

## 2024-09-06 DIAGNOSIS — E1122 Type 2 diabetes mellitus with diabetic chronic kidney disease: Secondary | ICD-10-CM

## 2024-09-06 LAB — POCT GLYCOSYLATED HEMOGLOBIN (HGB A1C): Hemoglobin A1C: 6.3 % — AB (ref 4.0–5.6)

## 2024-09-06 NOTE — Assessment & Plan Note (Signed)
 So far has been able to afford her meds.

## 2024-09-06 NOTE — Assessment & Plan Note (Signed)
 Appt with [facility] later this month. + pacemaker

## 2024-09-06 NOTE — Assessment & Plan Note (Signed)
 Uses Protonix  PRN

## 2024-09-06 NOTE — Assessment & Plan Note (Signed)
Due for urine micro 

## 2024-09-07 ENCOUNTER — Ambulatory Visit: Payer: Self-pay | Admitting: Family Medicine

## 2024-09-07 LAB — CMP14+EGFR
ALT: 10 [IU]/L (ref 0–32)
AST: 13 [IU]/L (ref 0–40)
Albumin: 4.1 g/dL (ref 3.8–4.8)
Alkaline Phosphatase: 35 [IU]/L — ABNORMAL LOW (ref 49–135)
BUN/Creatinine Ratio: 20 (ref 12–28)
BUN: 29 mg/dL — ABNORMAL HIGH (ref 8–27)
Bilirubin Total: 0.3 mg/dL (ref 0.0–1.2)
CO2: 19 mmol/L — ABNORMAL LOW (ref 20–29)
Calcium: 9.5 mg/dL (ref 8.7–10.3)
Chloride: 106 mmol/L (ref 96–106)
Creatinine, Ser: 1.43 mg/dL — ABNORMAL HIGH (ref 0.57–1.00)
Globulin, Total: 2.5 g/dL (ref 1.5–4.5)
Glucose: 108 mg/dL — ABNORMAL HIGH (ref 70–99)
Potassium: 4.6 mmol/L (ref 3.5–5.2)
Sodium: 141 mmol/L (ref 134–144)
Total Protein: 6.6 g/dL (ref 6.0–8.5)
eGFR: 37 mL/min/{1.73_m2} — ABNORMAL LOW

## 2024-09-07 LAB — LIPID PANEL
Chol/HDL Ratio: 5.7 ratio — ABNORMAL HIGH (ref 0.0–4.4)
Cholesterol, Total: 223 mg/dL — ABNORMAL HIGH (ref 100–199)
HDL: 39 mg/dL — ABNORMAL LOW
LDL Chol Calc (NIH): 155 mg/dL — ABNORMAL HIGH (ref 0–99)
Triglycerides: 161 mg/dL — ABNORMAL HIGH (ref 0–149)
VLDL Cholesterol Cal: 29 mg/dL (ref 5–40)

## 2024-09-07 LAB — CBC
Hematocrit: 42.4 % (ref 34.0–46.6)
Hemoglobin: 13.5 g/dL (ref 11.1–15.9)
MCH: 30.4 pg (ref 26.6–33.0)
MCHC: 31.8 g/dL (ref 31.5–35.7)
MCV: 96 fL (ref 79–97)
Platelets: 367 10*3/uL (ref 150–450)
RBC: 4.44 x10E6/uL (ref 3.77–5.28)
RDW: 13.4 % (ref 11.7–15.4)
WBC: 9 10*3/uL (ref 3.4–10.8)

## 2024-09-07 LAB — VITAMIN B12: Vitamin B-12: 266 pg/mL (ref 232–1245)

## 2024-09-07 NOTE — Progress Notes (Signed)
 Call patient: Kidney function looks better and had jumped up slightly to 1.8 back in June and it is back down to 1.4 which is closer to her baseline.  Liver enzymes overall look okay.  Total cholesterol and LDL are very elevated putting her at increased risk for heart attack and stroke.  Based on these numbers I would highly recommend a statin.  I know she has declined in the past but still want to make her aware that that is still what is recommended.  Blood count is normal no sign of anemia or infection.  B12 is technically in the normal range but it still on the low end.  I would recommend taking a daily over-the-counter B12 supplement.  If she is already taking 1 then please let me know.  Lets plan to recheck B12 again in 3 to 4 months.
